# Patient Record
Sex: Female | Born: 1965
Health system: Southern US, Community
[De-identification: ages and names within clinical notes are randomized; demographics above are authoritative.]

## PROBLEM LIST (undated history)

## (undated) DIAGNOSIS — D509 Iron deficiency anemia, unspecified: Secondary | ICD-10-CM

## (undated) DIAGNOSIS — M109 Gout, unspecified: Secondary | ICD-10-CM

## (undated) DIAGNOSIS — E079 Disorder of thyroid, unspecified: Secondary | ICD-10-CM

## (undated) DIAGNOSIS — G629 Polyneuropathy, unspecified: Secondary | ICD-10-CM

## (undated) DIAGNOSIS — N183 Chronic kidney disease, stage 3 unspecified: Secondary | ICD-10-CM

## (undated) DIAGNOSIS — E669 Obesity, unspecified: Secondary | ICD-10-CM

## (undated) DIAGNOSIS — F419 Anxiety disorder, unspecified: Secondary | ICD-10-CM

## (undated) DIAGNOSIS — N950 Postmenopausal bleeding: Secondary | ICD-10-CM

## (undated) DIAGNOSIS — Z8669 Personal history of other diseases of the nervous system and sense organs: Secondary | ICD-10-CM

## (undated) DIAGNOSIS — R200 Anesthesia of skin: Secondary | ICD-10-CM

## (undated) DIAGNOSIS — K589 Irritable bowel syndrome without diarrhea: Secondary | ICD-10-CM

## (undated) DIAGNOSIS — E785 Hyperlipidemia, unspecified: Secondary | ICD-10-CM

## (undated) DIAGNOSIS — K219 Gastro-esophageal reflux disease without esophagitis: Secondary | ICD-10-CM

## (undated) DIAGNOSIS — N840 Polyp of corpus uteri: Secondary | ICD-10-CM

## (undated) DIAGNOSIS — G4730 Sleep apnea, unspecified: Secondary | ICD-10-CM

## (undated) DIAGNOSIS — M5126 Other intervertebral disc displacement, lumbar region: Secondary | ICD-10-CM

## (undated) DIAGNOSIS — M51369 Other intervertebral disc degeneration, lumbar region without mention of lumbar back pain or lower extremity pain: Secondary | ICD-10-CM

## (undated) DIAGNOSIS — I10 Essential (primary) hypertension: Secondary | ICD-10-CM

## (undated) DIAGNOSIS — C4490 Unspecified malignant neoplasm of skin, unspecified: Secondary | ICD-10-CM

## (undated) DIAGNOSIS — K760 Fatty (change of) liver, not elsewhere classified: Secondary | ICD-10-CM

## (undated) DIAGNOSIS — Z973 Presence of spectacles and contact lenses: Secondary | ICD-10-CM

## (undated) DIAGNOSIS — Z860100 Personal history of colon polyps, unspecified: Secondary | ICD-10-CM

## (undated) DIAGNOSIS — B009 Herpesviral infection, unspecified: Secondary | ICD-10-CM

## (undated) DIAGNOSIS — J302 Other seasonal allergic rhinitis: Secondary | ICD-10-CM

## (undated) DIAGNOSIS — E039 Hypothyroidism, unspecified: Secondary | ICD-10-CM

## (undated) DIAGNOSIS — M25551 Pain in right hip: Secondary | ICD-10-CM

## (undated) DIAGNOSIS — M5136 Other intervertebral disc degeneration, lumbar region: Secondary | ICD-10-CM

## (undated) DIAGNOSIS — Z8489 Family history of other specified conditions: Secondary | ICD-10-CM

## (undated) DIAGNOSIS — E119 Type 2 diabetes mellitus without complications: Secondary | ICD-10-CM

## (undated) DIAGNOSIS — M1990 Unspecified osteoarthritis, unspecified site: Secondary | ICD-10-CM

## (undated) HISTORY — DX: Type 2 diabetes mellitus without complications: E11.9

## (undated) HISTORY — PX: GASTRIC RESTRICTION SURGERY: SHX653

## (undated) HISTORY — DX: Gastro-esophageal reflux disease without esophagitis: K21.9

## (undated) HISTORY — PX: SKIN CANCER EXCISION: SHX779

## (undated) HISTORY — PX: TUBAL LIGATION: SHX77

## (undated) HISTORY — DX: Unspecified osteoarthritis, unspecified site: M19.90

## (undated) HISTORY — DX: Sleep apnea, unspecified: G47.30

## (undated) HISTORY — PX: ESOPHAGEAL DILATION: SHX303

## (undated) HISTORY — DX: Anxiety disorder, unspecified: F41.9

## (undated) HISTORY — DX: Disorder of thyroid, unspecified: E07.9

## (undated) HISTORY — PX: CHOLECYSTECTOMY: SHX55

## (undated) HISTORY — PX: UPPER GI ENDOSCOPY: SHX6162

## (undated) HISTORY — DX: Gout, unspecified: M10.9

## (undated) HISTORY — PX: TONSILLECTOMY: SUR1361

---

## 2002-06-17 ENCOUNTER — Encounter: Admission: RE | Admit: 2002-06-17 | Discharge: 2002-06-17 | Payer: Self-pay | Admitting: Unknown Physician Specialty

## 2002-06-17 ENCOUNTER — Encounter: Payer: Self-pay | Admitting: Unknown Physician Specialty

## 2003-07-01 ENCOUNTER — Ambulatory Visit (HOSPITAL_COMMUNITY): Admission: RE | Admit: 2003-07-01 | Discharge: 2003-07-01 | Payer: Self-pay | Admitting: Gastroenterology

## 2006-11-28 ENCOUNTER — Other Ambulatory Visit: Admission: RE | Admit: 2006-11-28 | Discharge: 2006-11-28 | Payer: Self-pay | Admitting: Family Medicine

## 2007-02-09 ENCOUNTER — Encounter: Admission: RE | Admit: 2007-02-09 | Discharge: 2007-02-09 | Payer: Self-pay | Admitting: Family Medicine

## 2007-03-17 ENCOUNTER — Encounter: Admission: RE | Admit: 2007-03-17 | Discharge: 2007-03-17 | Payer: Self-pay | Admitting: Family Medicine

## 2007-04-10 ENCOUNTER — Ambulatory Visit: Admission: RE | Admit: 2007-04-10 | Discharge: 2007-04-10 | Payer: Self-pay | Admitting: Gynecology

## 2007-08-12 ENCOUNTER — Encounter (INDEPENDENT_AMBULATORY_CARE_PROVIDER_SITE_OTHER): Payer: Self-pay | Admitting: Obstetrics and Gynecology

## 2007-08-12 ENCOUNTER — Ambulatory Visit (HOSPITAL_COMMUNITY): Admission: RE | Admit: 2007-08-12 | Discharge: 2007-08-12 | Payer: Self-pay | Admitting: Obstetrics and Gynecology

## 2008-04-18 ENCOUNTER — Encounter: Admission: RE | Admit: 2008-04-18 | Discharge: 2008-04-18 | Payer: Self-pay | Admitting: Family Medicine

## 2009-01-04 ENCOUNTER — Ambulatory Visit (HOSPITAL_BASED_OUTPATIENT_CLINIC_OR_DEPARTMENT_OTHER): Admission: RE | Admit: 2009-01-04 | Discharge: 2009-01-04 | Payer: Self-pay | Admitting: Family Medicine

## 2009-01-14 ENCOUNTER — Ambulatory Visit: Payer: Self-pay | Admitting: Internal Medicine

## 2009-04-11 ENCOUNTER — Encounter: Admission: RE | Admit: 2009-04-11 | Discharge: 2009-04-11 | Payer: Self-pay | Admitting: Gastroenterology

## 2010-03-30 ENCOUNTER — Encounter: Admission: RE | Admit: 2010-03-30 | Discharge: 2010-03-30 | Payer: Self-pay | Admitting: Obstetrics and Gynecology

## 2010-04-03 ENCOUNTER — Encounter: Admission: RE | Admit: 2010-04-03 | Discharge: 2010-04-03 | Payer: Self-pay | Admitting: Obstetrics and Gynecology

## 2010-12-09 ENCOUNTER — Encounter: Payer: Self-pay | Admitting: Obstetrics and Gynecology

## 2011-03-26 ENCOUNTER — Other Ambulatory Visit: Payer: Self-pay | Admitting: Obstetrics and Gynecology

## 2011-03-26 DIAGNOSIS — Z1231 Encounter for screening mammogram for malignant neoplasm of breast: Secondary | ICD-10-CM

## 2011-04-02 NOTE — Procedures (Signed)
NAME:  Holly Rodriguez, Holly Rodriguez                 ACCOUNT NO.:  [id number]   MEDICAL RECORD NO.:  [id number]          PATIENT TYPE:  OUT   LOCATION:  SLEEP CENTER                 FACILITY:  [facility]   PHYSICIAN:  Clinton D. Maple Hudson, MD, FCCP, FACPDATE OF BIRTH:  17-Jun-1966   DATE OF STUDY:  01/04/2009                            NOCTURNAL POLYSOMNOGRAM   REFERRING PHYSICIAN:   INDICATIONS FOR STUDY:  Hypersomnia with sleep apnea.   EPWORTH SLEEPINESS SCORE:  Epworth sleepiness score 9/24, BMI 48.7,  weight 275 pounds, height 63 inches, and neck 14 inches.   HOME MEDICATIONS:  Charted and reviewed.   SLEEP ARCHITECTURE:  Split study protocol.  During the diagnostic phase  total sleep time 188 minutes with sleep efficiency 76%.  Stage I was  9.5%, stage II 90.5%, stages III and REM were absent.  Sleep latency  24.5 minutes, awake after sleep onset 33 minutes, arousal index 71.9  indicating increased EEG arousal.  No bedtime medication was taken.   RESPIRATORY DATA:  Split study protocol.  Apnea/hypopnea index (AHI)  16.6 per hour.  A total of 52 events was scored, all as hypopneas.  Most  events were while none supine, but events were recorded in all sleep  positions.  CPAP was then titrated to 12 CWP, AHI 0 per hour.  She wore  a medium ResMed Mirage Quattro Full Face Mask with heated humidifier.   OXYGEN DATA:  Very loud snoring before CPAP with oxygen desaturation to  a nadir of 88%.  After CPAP control, Mean oxygen saturation held 95.2%  on room air.   CARDIAC DATA:  Normal sinus rhythm.   MOVEMENT/PARASOMNIA:  A few limb jerks with arousal, insignificant.  No  bathroom trips.   IMPRESSION/RECOMMENDATIONS:  1. Moderate obstructive sleep apnea/hypopnea syndrome, AHI 16.6 per      hour with events in all positions, most common while none supine,      very loud snoring with oxygen desaturation to a nadir of 88%.  2. Successful continuous positive airway pressure titration to 12 CWP,  apnea-hypopnea index 0 per hour.  She wore a medium ResMed Mirage      Quattro Full Face Mask with heated humidifier.      Clinton D. Maple Hudson, MD, FCCP, FACP  Diplomate, Biomedical engineer of Sleep Medicine  Electronically Signed     CDY/MEDQ  D:  01/07/2009 11:03:00  T:  01/08/2009 03:43:40  Job:  [id number]

## 2011-04-02 NOTE — Consult Note (Signed)
NAME:  Holly Rodriguez, Holly Rodriguez                 ACCOUNT NO.:  [id number]   MEDICAL RECORD NO.:  [id number]          PATIENT TYPE:  OUT   LOCATION:  GYN                          FACILITY:  [facility]   PHYSICIAN:  De Blanch, M.D.DATE OF BIRTH:  10/17/1966   DATE OF CONSULTATION:  04/10/2007  DATE OF DISCHARGE:                                 CONSULTATION   CHIEF COMPLAINT:  Cervical cyst.   HISTORY OF PRESENT ILLNESS:  A 45 year old white female seen in  consultation at the request of Dr. Olivia Mackie regarding a probable  large nabothian cyst.  The patient apparently underwent a CT scan of the  abdomen and pelvis to assess her intraabdominal pathology associated  with complications due to gastric bypass surgery.  In the course of that  examination, a cyst was identified in the cervix.  This has been  subsequently typified by transvaginal ultrasound which has identified a  4.5 x 3.6 x 3.5 cm cystic mass in the cervix and a second 1.8 x 1.3 x  1.4 cm mass.  The patient herself is entirely asymptomatic.   The patient has not had Pap smears for a number of years.  A recent Pap  smear showed low-grade SIL changes.   The patient has regular cyclic menstrual periods.  She has had a tubal  ligation.   PAST MEDICAL HISTORY:  Medical illnesses:  Hypertension,  gastroesophageal reflux disease, stress incontinence, and insomnia.   CURRENT MEDICATIONS:  Lasix, K-Dur.   FAMILY HISTORY:  Negative for gynecologic, breast or colon cancer.   OBSTETRICAL HISTORY:  Gravid 2.  Both pregnancies delivered by cesarean  section.   SOCIAL HISTORY:  The patient is single.  She does not smoke.   SURGICAL HISTORY:  Cesarean sections x2.  Two gastric bypass procedures.   REVIEW OF SYSTEMS:  Ten-point comprehensive review of systems negative  except as noted above.   DRUG ALLERGIES:  None.   PHYSICAL EXAMINATION:  VITAL SIGNS:  Weight 261 pounds, height 5 feet 3  inches, blood pressure 136/92, pulse  76.  GENERAL:  The patient is an obese, pleasant white female in no acute  distress.  HEENT:  Negative,  NECK:  Supple without thyromegaly.  ABDOMEN:  Obese, soft, nontender.  No mass, organomegaly, ascites, or  hernias noted  There is no supraclavicular or inguinal adenopathy.  PELVIC:  EG/BUS, vagina, bladder, urethra appear normal.  The cervix is  somewhat difficult to fully visualize as there seems to be a bit of a  constriction ring at the apex of the vagina.  Upon palpitation, the  cervix is enlarged to approximately 4-5 cm.  It is mobile and nontender.  Rectovaginal exam confirms.   IMPRESSION:  Large nabothian cyst which is entirely asymptomatic.  At  this juncture I would not recommend any further evaluation or treatment.   With regard to her low-grade SIL Pap smear, I would recommend she have a  repeat Pap smear in 6 months.  As long as her Pap smear remains low-  grade it would not suggest any further intervention.   She will return  to the care of Dr. Billy Coast for followup.      De Blanch, M.D.  Electronically Signed     DC/MEDQ  D:  04/10/2007  T:  04/10/2007  Job:  [id number]   cc:   Lenoard Aden, M.D.  Fax: [phone]   Telford Nab, R.N.  [address]  [place], [place] [address]

## 2011-04-02 NOTE — Op Note (Signed)
NAME:  Holly Rodriguez, Holly Rodriguez                 ACCOUNT NO.:  [id number]   MEDICAL RECORD NO.:  [id number]          PATIENT TYPE:  AMB   LOCATION:  SDC                           FACILITY:  [facility]   PHYSICIAN:  Lenoard Aden, M.D.DATE OF BIRTH:  04/11/1966   DATE OF PROCEDURE:  08/12/2007  DATE OF DISCHARGE:                               OPERATIVE REPORT   PREOPERATIVE DIAGNOSIS:  Enlarging cervical mass.   POSTOPERATIVE DIAGNOSIS:  Enlarging cervical mass.   PROCEDURE:  Excision and drainage of cervical mass.   SURGEON:  Lenoard Aden, M.D.   ANESTHESIA:  General.   BLOOD LOSS:  Less than 50 mL.   COMPLICATIONS:  None.   DRAINS:  None.   COUNTS:  Correct.   Patient to recovery in good condition.   SPECIMEN:  Cystic mass wall and constituents of the mass to include  thick, greenish paste-like fluid.   OPERATIVE PROCEDURE:  After being apprised of risks of anesthesia,  infection, bleeding, injury to abdominal organs, need for repair,  delayed versus immediate complications to include bowel and bladder  injury, inability to cure cancer, the patient brought to the operating  room where she was administered general anesthetic without  complications, prepped and draped in usual sterile fashion.  Catheterized until the bladder was empty.  Weighted speculum placed per  vagina, sidewall retractors placed.  Visualization reveals a bulbous 4  to 5 cm cervical mass which occupies the right anterior portion of the  cervix.  Bladder flap is identified.  External cervical os is identified  to the patient's the left lateral and posterior to this mass.  At this  point a midportion area of the mass was picked away from the  vasculature, bladder flap and external os and a small incision is made  using electrocautery.  Upon entering the mass it is noted that there is  a large amount of thick, green, paste that occupies the interior of this  mass which is all evacuated using suction and forceps to  evacuate this  otherwise thick paste.  The area is irrigated, side walls of the mass  appeared to be smooth-walled.  The tissue within this mass was sent to  pathology.  A piece of the side wall is then grasped and the side wall  is excised without difficulty.  Good hemostasis noted.  The defect  created by the previous contents of the cyst is then closed using a 2-0  Vicryl suture to close the dead space anteriorly and  then superficially closed using a 2-0 Vicryl interrupted suture.  Good  hemostasis is noted.  All instruments are removed, tissue sent to  pathology.  The patient tolerates procedure and well is transferred to  recovery in good condition.      Lenoard Aden, M.D.  Electronically Signed     RJT/MEDQ  D:  08/12/2007  T:  08/13/2007  Job:  [id number]

## 2011-04-05 NOTE — Op Note (Signed)
NAME:  Holly Rodriguez, Holly Rodriguez                           ACCOUNT NO.:  [id number]   MEDICAL RECORD NO.:  [id number]                   PATIENT TYPE:  AMB   LOCATION:  ENDO                                 FACILITY:  [facility]   PHYSICIAN:  Petra Kuba, M.D.                 DATE OF BIRTH:  02/18/1966   DATE OF PROCEDURE:  06/21/2003  DATE OF DISCHARGE:                                 OPERATIVE REPORT   PROCEDURE:  Esophagogastroduodenoscopy.   INDICATIONS FOR PROCEDURE:  The patient with longstanding upper tract  symptoms, status post gastric stapling procedures. Want to evaluate. Consent  was signed after the  risks, benefits, methods and options were thoroughly  discussed multiple  times in the past.   MEDICATIONS USED:  Demerol 100, Versed 10.   DESCRIPTION OF PROCEDURE:  The scope was inserted by direct vision. The  proximal  and mid esophagus  were normal. In the distal esophagus was a  small  hiatal hernia. She had interesting surgical changes of her stomach,  seeing that she had 2 discrete pouches, but both anastomoses and both  staplings were widely patent. There was a small amount of debris in the  first pouch.   The scope was inserted through both pouches through a normal antrum and  pylorus, duodenal bulb and around the 2nd portion of the duodenum. A slow  withdrawal back to the bulb confirmed its normal appearance. The scope was  then retroflexed which confirmed some of the surgical changes, but no  abnormality of the stomach  was seen on straight or retroflexed  visualization. We did wash and suction some of the debris out of the first  pouch.   The scope was then slowly withdrawn after airway suction. Again a good look  at the esophagus  was normal without signs of esophagitis or Barrett's  esophagus. The scope was removed. The patient tolerated the procedure well.  There were no obvious immediate complications.   ENDOSCOPIC DIAGNOSIS:  1. Small hiatal hernia without signs  of Barrett's esophagus or esophagitis.  2. Surgical changes noted. It almost looks like a double  pouch but both     with widely patent openings.  3. Otherwise  normal esophagogastroduodenoscopy.   PLAN:  1. Consider redo surgery.  2.     Consider checking her gallbladder.  My guess would probably be a surgeon     would want an upper GI to redefine the anatomy better  and follow up with     me in 2 months.  3. In the meantime b.i.d. proton pump inhibitors. Might even consider a     motility agent.  Petra Kuba, M.D.    MEM/MEDQ  D:  07/01/2003  T:  07/01/2003  Job:  [id number]   cc:   Ernestina Penna, M.D.  [address] [place]  [place] [address]  Fax: [phone]

## 2011-04-10 ENCOUNTER — Ambulatory Visit: Payer: Self-pay

## 2011-04-17 ENCOUNTER — Ambulatory Visit
Admission: RE | Admit: 2011-04-17 | Discharge: 2011-04-17 | Disposition: A | Discharge status: Home / Self Care | Payer: BC Managed Care – PPO | Source: Ambulatory Visit | Attending: Obstetrics and Gynecology | Admitting: Obstetrics and Gynecology

## 2011-04-17 DIAGNOSIS — Z1231 Encounter for screening mammogram for malignant neoplasm of breast: Secondary | ICD-10-CM

## 2011-05-29 ENCOUNTER — Other Ambulatory Visit: Payer: Self-pay | Admitting: Family Medicine

## 2011-05-29 DIAGNOSIS — E041 Nontoxic single thyroid nodule: Secondary | ICD-10-CM

## 2011-06-04 ENCOUNTER — Ambulatory Visit
Admission: RE | Admit: 2011-06-04 | Discharge: 2011-06-04 | Disposition: A | Discharge status: Home / Self Care | Payer: BC Managed Care – PPO | Source: Ambulatory Visit | Attending: Family Medicine | Admitting: Family Medicine

## 2011-06-04 DIAGNOSIS — E041 Nontoxic single thyroid nodule: Secondary | ICD-10-CM

## 2011-08-29 LAB — CBC
HCT: 40
MCHC: 34.3
Platelets: 348
RDW: 14.9 — ABNORMAL HIGH

## 2011-08-29 LAB — SAMPLE TO BLOOD BANK

## 2011-08-29 LAB — BASIC METABOLIC PANEL
BUN: 9
CO2: 24
Calcium: 8.9
GFR calc non Af Amer: >60
Glucose, Bld: 98

## 2012-06-16 ENCOUNTER — Other Ambulatory Visit: Payer: Self-pay | Admitting: Obstetrics and Gynecology

## 2012-06-16 DIAGNOSIS — Z1231 Encounter for screening mammogram for malignant neoplasm of breast: Secondary | ICD-10-CM

## 2012-06-29 ENCOUNTER — Ambulatory Visit: Payer: BC Managed Care – PPO

## 2012-07-01 ENCOUNTER — Ambulatory Visit: Payer: BC Managed Care – PPO

## 2012-07-10 ENCOUNTER — Ambulatory Visit
Admission: RE | Admit: 2012-07-10 | Discharge: 2012-07-10 | Disposition: A | Discharge status: Home / Self Care | Payer: BC Managed Care – PPO | Source: Ambulatory Visit | Attending: Obstetrics and Gynecology | Admitting: Obstetrics and Gynecology

## 2012-07-10 DIAGNOSIS — Z1231 Encounter for screening mammogram for malignant neoplasm of breast: Secondary | ICD-10-CM

## 2012-11-18 DIAGNOSIS — Z8719 Personal history of other diseases of the digestive system: Secondary | ICD-10-CM

## 2012-11-18 HISTORY — DX: Personal history of other diseases of the digestive system: Z87.19

## 2013-02-01 ENCOUNTER — Other Ambulatory Visit: Payer: Self-pay | Admitting: Nurse Practitioner

## 2013-02-01 LAB — COMPLETE METABOLIC PANEL WITH GFR
ALT: 28 U/L (ref 0–35)
BUN: 14 mg/dL (ref 6–23)
CO2: 31 mEq/L (ref 19–32)
Creat: 0.67 mg/dL (ref 0.50–1.10)
GFR, Est African American: >89 mL/min
GFR, Est Non African American: >89 mL/min
Total Bilirubin: 0.3 mg/dL (ref 0.3–1.2)

## 2013-02-01 LAB — THYROID PANEL WITH TSH
Free Thyroxine Index: 2.7 (ref 1.0–3.9)
T3 Uptake: 32.4 % (ref 22.5–37.0)
T4, Total: 8.2 ug/dL (ref 5.0–12.5)

## 2013-02-02 LAB — ANEMIA PANEL 7
%SAT: 10 % — ABNORMAL LOW (ref 20–55)
Ferritin: 8 ng/mL — ABNORMAL LOW (ref 10–291)
Folate: 4.8 ng/mL
MCH: 26.6 pg (ref 26.0–34.0)
MCHC: 32.6 g/dL (ref 30.0–36.0)
MCV: 81.5 fL (ref 78.0–100.0)
Platelets: 319 10*3/uL (ref 150–400)
RBC: 4.59 MIL/uL (ref 3.87–5.11)
RDW: 15.5 % (ref 11.5–15.5)
Retic Ct Pct: 1.7 % (ref 0.4–2.3)
Vitamin B-12: 441 pg/mL (ref 211–911)

## 2013-02-04 ENCOUNTER — Telehealth: Payer: Self-pay | Admitting: Nurse Practitioner

## 2013-02-04 LAB — NMR LIPOPROFILE WITH LIPIDS
LDL (calc): 97 mg/dL (ref <100)
LDL Particle Number: 1581 nmol/L — ABNORMAL HIGH (ref <1000)
LDL Size: 20.8 nm (ref >20.5)
LP-IR Score: 89 — ABNORMAL HIGH (ref <=45)
Large VLDL-P: 13.7 nmol/L — ABNORMAL HIGH (ref <=2.7)
Small LDL Particle Number: 692 nmol/L — ABNORMAL HIGH (ref <=527)
VLDL Size: 56.2 nm — ABNORMAL HIGH (ref >=46.6)

## 2013-02-04 NOTE — Telephone Encounter (Signed)
Pt aware labs not in yet and will call as soon as we get them

## 2013-02-04 NOTE — Telephone Encounter (Signed)
Wants results from labs done on Monday.

## 2013-02-08 ENCOUNTER — Telehealth: Payer: Self-pay | Admitting: Family Medicine

## 2013-02-08 NOTE — Telephone Encounter (Signed)
Patient wants lab results she had done last week labs are in the system

## 2013-02-08 NOTE — Telephone Encounter (Signed)
Patient aware of labs.  

## 2013-02-16 ENCOUNTER — Other Ambulatory Visit: Payer: Self-pay | Admitting: Nurse Practitioner

## 2013-05-03 ENCOUNTER — Other Ambulatory Visit: Payer: Self-pay

## 2013-08-06 ENCOUNTER — Ambulatory Visit: Payer: Self-pay | Admitting: Family Medicine

## 2013-08-09 ENCOUNTER — Ambulatory Visit (INDEPENDENT_AMBULATORY_CARE_PROVIDER_SITE_OTHER): Payer: BC Managed Care – PPO | Admitting: Family Medicine

## 2013-08-09 ENCOUNTER — Encounter: Payer: Self-pay | Admitting: Family Medicine

## 2013-08-09 VITALS — BP 130/83 | HR 89 | Temp 97.1°F | Ht 64.0 in | Wt 282.6 lb

## 2013-08-09 DIAGNOSIS — F411 Generalized anxiety disorder: Secondary | ICD-10-CM

## 2013-08-09 DIAGNOSIS — K589 Irritable bowel syndrome without diarrhea: Secondary | ICD-10-CM

## 2013-08-09 DIAGNOSIS — D649 Anemia, unspecified: Secondary | ICD-10-CM

## 2013-08-09 DIAGNOSIS — E039 Hypothyroidism, unspecified: Secondary | ICD-10-CM

## 2013-08-09 DIAGNOSIS — R609 Edema, unspecified: Secondary | ICD-10-CM

## 2013-08-09 DIAGNOSIS — K219 Gastro-esophageal reflux disease without esophagitis: Secondary | ICD-10-CM

## 2013-08-09 LAB — POCT CBC
Granulocyte percent: 62.2 %G (ref 37–80)
HCT, POC: 38.7 % (ref 37.7–47.9)
Hemoglobin: 12.9 g/dL (ref 12.2–16.2)
Lymph, poc: 4.4 — AB (ref 0.6–3.4)
MCH, POC: 27.1 pg (ref 27–31.2)
MCHC: 33.3 g/dL (ref 31.8–35.4)
MCV: 81.4 fL (ref 80–97)
MPV: 7.9 fL (ref 0–99.8)
POC Granulocyte: 7.9 — AB (ref 2–6.9)
POC LYMPH PERCENT: 34.6 %L (ref 10–50)
Platelet Count, POC: 281 10*3/uL (ref 142–424)
RBC: 4.8 M/uL (ref 4.04–5.48)
RDW, POC: 17.1 %
WBC: 12.7 10*3/uL — AB (ref 4.6–10.2)

## 2013-08-09 MED ORDER — FUROSEMIDE 40 MG PO TABS: 40.0000 mg | ORAL_TABLET | Freq: Every day | ORAL | Status: DC

## 2013-08-09 MED ORDER — LEVOTHYROXINE SODIUM 75 MCG PO TABS: 75.0000 ug | ORAL_TABLET | Freq: Every day | ORAL | Status: DC

## 2013-08-09 MED ORDER — OMEPRAZOLE 40 MG PO CPDR: 40.0000 mg | DELAYED_RELEASE_CAPSULE | Freq: Every day | ORAL | Status: DC

## 2013-08-09 MED ORDER — HYOSCYAMINE SULFATE ER 0.375 MG PO TB12: 0.3750 mg | ORAL_TABLET | Freq: Two times a day (BID) | ORAL | Status: DC | PRN

## 2013-08-09 MED ORDER — POTASSIUM CHLORIDE ER 10 MEQ PO TBCR: 10.0000 meq | EXTENDED_RELEASE_TABLET | Freq: Every day | ORAL | Status: DC

## 2013-08-09 MED ORDER — ALPRAZOLAM 0.25 MG PO TABS: 0.2500 mg | ORAL_TABLET | Freq: Two times a day (BID) | ORAL | Status: DC | PRN

## 2013-08-09 NOTE — Progress Notes (Signed)
  Subjective:    Patient ID: Holly Rodriguez, female    DOB: 1966-03-04, 47 y.o.   MRN: [id number]  HPI  This 47 y.o. female presents for evaluation of needing refills.  She has hx of  GERD, and hypothyroidism.  She has hx of IBS and takes levbid.  She is due for labs.  Review of Systems    No chest pain, SOB, HA, dizziness, vision change, N/V, diarrhea, constipation, dysuria, urinary urgency or frequency, myalgias, arthralgias or rash.  Objective:   Physical Exam   Vital signs noted  Well developed well nourished female.  HEENT - Head atraumatic Normocephalic                Eyes - PERRLA, Conjuctiva - clear Sclera- Clear EOMI                Ears - EAC's Wnl TM's Wnl Gross Hearing WNL                Nose - Nares patent                 Throat - oropharanx wnl Respiratory - Lungs CTA bilateral Cardiac - RRR S1 and S2 without murmur GI - Abdomen soft Nontender and bowel sounds active x 4 Extremities - No edema. Neuro - Grossly intact.     Assessment & Plan:  Unspecified hypothyroidism - Plan: levothyroxine (SYNTHROID) 75 MCG tablet, CMP14+EGFR, Thyroid Panel With TSH  GERD (gastroesophageal reflux disease) - Plan: omeprazole (PRILOSEC) 40 MG capsule  Anemia - Plan: POCT CBC  Edema - Plan: potassium chloride (KLOR-CON 10) 10 MEQ tablet, furosemide (LASIX) 40 MG tablet  Anxiety state, unspecified - Plan: ALPRAZolam (XANAX) 0.25 MG tablet  IBS (irritable bowel syndrome) - Plan: hyoscyamine (LEVBID) 0.375 MG 12 hr tablet  Follow up in 6 months  Deatra Canter FNP

## 2013-08-10 LAB — CMP14+EGFR
ALT: 38 IU/L — ABNORMAL HIGH (ref 0–32)
AST: 32 IU/L (ref 0–40)
Albumin/Globulin Ratio: 1.4 (ref 1.1–2.5)
Albumin: 4.2 g/dL (ref 3.5–5.5)
Alkaline Phosphatase: 107 IU/L (ref 39–117)
BUN/Creatinine Ratio: 17 (ref 9–23)
BUN: 10 mg/dL (ref 6–24)
CO2: 24 mmol/L (ref 18–29)
Calcium: 9.3 mg/dL (ref 8.7–10.2)
Chloride: 94 mmol/L — ABNORMAL LOW (ref 97–108)
Creatinine, Ser: 0.59 mg/dL (ref 0.57–1.00)
GFR calc Af Amer: 126 mL/min/{1.73_m2} (ref >59)
GFR calc non Af Amer: 109 mL/min/{1.73_m2} (ref >59)
Globulin, Total: 2.9 g/dL (ref 1.5–4.5)
Glucose: 134 mg/dL — ABNORMAL HIGH (ref 65–99)
Potassium: 3.6 mmol/L (ref 3.5–5.2)
Sodium: 140 mmol/L (ref 134–144)
Total Bilirubin: 0.2 mg/dL (ref 0.0–1.2)
Total Protein: 7.1 g/dL (ref 6.0–8.5)

## 2013-08-10 LAB — THYROID PANEL WITH TSH
Free Thyroxine Index: 2.6 (ref 1.2–4.9)
T3 Uptake Ratio: 29 % (ref 24–39)
T4, Total: 8.9 ug/dL (ref 4.5–12.0)
TSH: 4.11 u[IU]/mL (ref 0.450–4.500)

## 2013-08-10 NOTE — Patient Instructions (Signed)

## 2013-09-10 ENCOUNTER — Telehealth: Payer: Self-pay | Admitting: Family Medicine

## 2013-09-15 NOTE — Telephone Encounter (Signed)
Husband will relay results. 

## 2013-09-20 NOTE — Telephone Encounter (Signed)
LABS GIVEN TO PT. PT SAID SHE GOT RX FOR XANAX AND WAS .25  FOR BID AND SHOULD BE .5 BID #30. PT DIDN'T FILL RX AND CAN BRING RX BACK TO [place] IF YOU WILL CHANGE RX. ALSO LEVBID SENT IN TO MAIL ORDER FOR NAME BRAND, NEEDS THAT CHANGED TO GENERIC.

## 2013-09-20 NOTE — Telephone Encounter (Signed)
Yes tell her to bring rx back so it can be destroyed in front of me and then I will rx her xanax 0.5, I did look this up On the CSR and she has been rx'd 0.5mg  in the past.

## 2013-09-22 NOTE — Telephone Encounter (Signed)
PT NOTIFIED AND VERBALIZED UNDERSTANDING. 

## 2013-10-20 ENCOUNTER — Other Ambulatory Visit: Payer: Self-pay | Admitting: Family Medicine

## 2013-10-20 ENCOUNTER — Other Ambulatory Visit (INDEPENDENT_AMBULATORY_CARE_PROVIDER_SITE_OTHER): Payer: BC Managed Care – PPO

## 2013-10-20 DIAGNOSIS — R739 Hyperglycemia, unspecified: Secondary | ICD-10-CM

## 2013-10-20 DIAGNOSIS — R7309 Other abnormal glucose: Secondary | ICD-10-CM

## 2013-10-20 DIAGNOSIS — D72829 Elevated white blood cell count, unspecified: Secondary | ICD-10-CM

## 2013-10-20 DIAGNOSIS — F411 Generalized anxiety disorder: Secondary | ICD-10-CM

## 2013-10-20 LAB — POCT CBC
Granulocyte percent: 57.8 %G (ref 37–80)
HCT, POC: 42.3 % (ref 37.7–47.9)
Hemoglobin: 13 g/dL (ref 12.2–16.2)
Lymph, poc: 4.7 — AB (ref 0.6–3.4)
MCH, POC: 26.3 pg — AB (ref 27–31.2)
MCHC: 30.7 g/dL — AB (ref 31.8–35.4)
MCV: 85.6 fL (ref 80–97)
MPV: 7.3 fL (ref 0–99.8)
POC Granulocyte: 7.5 — AB (ref 2–6.9)
POC LYMPH PERCENT: 36.7 %L (ref 10–50)
Platelet Count, POC: 306 10*3/uL (ref 142–424)
RBC: 4.9 M/uL (ref 4.04–5.48)
RDW, POC: 16.5 %
WBC: 12.9 10*3/uL — AB (ref 4.6–10.2)

## 2013-10-20 LAB — POCT GLYCOSYLATED HEMOGLOBIN (HGB A1C): Hemoglobin A1C: 7.5

## 2013-10-20 MED ORDER — ALPRAZOLAM 0.5 MG PO TABS: 0.5000 mg | ORAL_TABLET | Freq: Every evening | ORAL | Status: DC | PRN

## 2013-10-20 MED ORDER — SERTRALINE HCL 50 MG PO TABS: 50.0000 mg | ORAL_TABLET | Freq: Every day | ORAL | Status: DC

## 2013-10-20 MED ORDER — HYOSCYAMINE SULFATE ER 0.375 MG PO TB12: 0.3750 mg | ORAL_TABLET | Freq: Two times a day (BID) | ORAL | Status: DC

## 2013-10-20 NOTE — Progress Notes (Signed)
Pt came in for labs only 

## 2013-10-21 ENCOUNTER — Other Ambulatory Visit: Payer: Self-pay | Admitting: Family Medicine

## 2013-10-21 DIAGNOSIS — E119 Type 2 diabetes mellitus without complications: Secondary | ICD-10-CM

## 2013-10-21 MED ORDER — METFORMIN HCL 500 MG PO TABS: 500.0000 mg | ORAL_TABLET | Freq: Two times a day (BID) | ORAL | Status: DC

## 2013-10-25 ENCOUNTER — Encounter: Payer: Self-pay | Admitting: Family Medicine

## 2013-10-25 ENCOUNTER — Encounter (HOSPITAL_COMMUNITY): Payer: Self-pay | Admitting: Emergency Medicine

## 2013-10-25 ENCOUNTER — Ambulatory Visit (HOSPITAL_COMMUNITY)
Admission: RE | Admit: 2013-10-25 | Discharge: 2013-10-25 | Disposition: A | Discharge status: Home / Self Care | Payer: BC Managed Care – PPO | Source: Ambulatory Visit | Attending: Family Medicine | Admitting: Family Medicine

## 2013-10-25 ENCOUNTER — Encounter (HOSPITAL_COMMUNITY): Payer: BC Managed Care – PPO | Admitting: Anesthesiology

## 2013-10-25 ENCOUNTER — Encounter (HOSPITAL_COMMUNITY)
Admission: EM | Disposition: A | Discharge status: Home / Self Care | Payer: Self-pay | Source: Home / Self Care | Attending: Emergency Medicine

## 2013-10-25 ENCOUNTER — Observation Stay (HOSPITAL_COMMUNITY)
Admission: EM | Admit: 2013-10-25 | Discharge: 2013-10-26 | Disposition: A | Discharge status: Home / Self Care | Payer: BC Managed Care – PPO | Attending: General Surgery | Admitting: General Surgery

## 2013-10-25 ENCOUNTER — Observation Stay (HOSPITAL_COMMUNITY): Payer: BC Managed Care – PPO | Admitting: Anesthesiology

## 2013-10-25 ENCOUNTER — Ambulatory Visit (INDEPENDENT_AMBULATORY_CARE_PROVIDER_SITE_OTHER): Payer: BC Managed Care – PPO | Admitting: Family Medicine

## 2013-10-25 VITALS — BP 143/96 | HR 89 | Temp 98.7°F | Ht 64.0 in | Wt 275.0 lb

## 2013-10-25 DIAGNOSIS — K3580 Unspecified acute appendicitis: Principal | ICD-10-CM | POA: Diagnosis present

## 2013-10-25 DIAGNOSIS — R109 Unspecified abdominal pain: Secondary | ICD-10-CM

## 2013-10-25 DIAGNOSIS — Z01812 Encounter for preprocedural laboratory examination: Secondary | ICD-10-CM | POA: Insufficient documentation

## 2013-10-25 HISTORY — PX: LAPAROSCOPIC APPENDECTOMY: SHX408

## 2013-10-25 HISTORY — DX: Irritable bowel syndrome, unspecified: K58.9

## 2013-10-25 LAB — CBC WITH DIFFERENTIAL/PLATELET
Basophils Absolute: 0.1 10*3/uL (ref 0.0–0.1)
Basophils Relative: 1 % (ref 0–1)
Eosinophils Absolute: 0.2 10*3/uL (ref 0.0–0.7)
Eosinophils Relative: 2 % (ref 0–5)
HCT: 38.9 % (ref 36.0–46.0)
Hemoglobin: 12.9 g/dL (ref 12.0–15.0)
Lymphocytes Relative: 31 % (ref 12–46)
Lymphs Abs: 4.1 10*3/uL — ABNORMAL HIGH (ref 0.7–4.0)
MCH: 28.7 pg (ref 26.0–34.0)
MCHC: 33.2 g/dL (ref 30.0–36.0)
MCV: 86.4 fL (ref 78.0–100.0)
Monocytes Absolute: 0.8 10*3/uL (ref 0.1–1.0)
Monocytes Relative: 6 % (ref 3–12)
Neutro Abs: 8.2 10*3/uL — ABNORMAL HIGH (ref 1.7–7.7)
Neutrophils Relative %: 60 % (ref 43–77)
Platelets: 315 10*3/uL (ref 150–400)
RBC: 4.5 MIL/uL (ref 3.87–5.11)
RDW: 15.4 % (ref 11.5–15.5)
WBC: 13.3 10*3/uL — ABNORMAL HIGH (ref 4.0–10.5)

## 2013-10-25 LAB — POCT CBC
Granulocyte percent: 70.4 %G (ref 37–80)
HCT, POC: 43.3 % (ref 37.7–47.9)
POC Granulocyte: 11.1 — AB (ref 2–6.9)
POC LYMPH PERCENT: 28.4 %L (ref 10–50)
RDW, POC: 16 %
WBC: 15.8 10*3/uL — AB (ref 4.6–10.2)

## 2013-10-25 LAB — BASIC METABOLIC PANEL WITH GFR
BUN: 8 mg/dL (ref 6–23)
CO2: 26 meq/L (ref 19–32)
Calcium: 8.9 mg/dL (ref 8.4–10.5)
Chloride: 94 meq/L — ABNORMAL LOW (ref 96–112)
Creatinine, Ser: 0.59 mg/dL (ref 0.50–1.10)
GFR calc Af Amer: >90 mL/min
GFR calc non Af Amer: >90 mL/min
Glucose, Bld: 155 mg/dL — ABNORMAL HIGH (ref 70–99)
Potassium: 3.2 meq/L — ABNORMAL LOW (ref 3.5–5.1)
Sodium: 136 meq/L (ref 135–145)

## 2013-10-25 LAB — POCT URINALYSIS DIPSTICK
Bilirubin, UA: NEGATIVE
Ketones, UA: NEGATIVE
Leukocytes, UA: NEGATIVE
Spec Grav, UA: 1.01
pH, UA: 7

## 2013-10-25 LAB — POCT UA - MICROSCOPIC ONLY
Bacteria, U Microscopic: NEGATIVE
Mucus, UA: NEGATIVE
WBC, Ur, HPF, POC: NEGATIVE
Yeast, UA: NEGATIVE

## 2013-10-25 LAB — POCT URINE PREGNANCY: Preg Test, Ur: NEGATIVE

## 2013-10-25 SURGERY — APPENDECTOMY, LAPAROSCOPIC
Anesthesia: General | Site: Abdomen

## 2013-10-25 MED ORDER — OXYCODONE-ACETAMINOPHEN 5-325 MG PO TABS
1.0000 | ORAL_TABLET | ORAL | Status: DC | PRN
  Administered 2013-10-26: 2 via ORAL
  Filled 2013-10-25: qty 2

## 2013-10-25 MED ORDER — BUPIVACAINE HCL (PF) 0.5 % IJ SOLN
INTRAMUSCULAR | Status: AC
  Filled 2013-10-25: qty 30

## 2013-10-25 MED ORDER — NEOSTIGMINE METHYLSULFATE 1 MG/ML IJ SOLN
INTRAMUSCULAR | Status: DC | PRN
  Administered 2013-10-25: 4 mg via INTRAVENOUS

## 2013-10-25 MED ORDER — ROCURONIUM BROMIDE 100 MG/10ML IV SOLN
INTRAVENOUS | Status: DC | PRN
  Administered 2013-10-25: 20 mg via INTRAVENOUS
  Administered 2013-10-25: 10 mg via INTRAVENOUS

## 2013-10-25 MED ORDER — ARTIFICIAL TEARS OP OINT
TOPICAL_OINTMENT | OPHTHALMIC | Status: AC
  Filled 2013-10-25: qty 3.5

## 2013-10-25 MED ORDER — LABETALOL HCL 5 MG/ML IV SOLN
INTRAVENOUS | Status: DC | PRN
  Administered 2013-10-25: 5 mg via INTRAVENOUS

## 2013-10-25 MED ORDER — KETOROLAC TROMETHAMINE 30 MG/ML IJ SOLN
30.0000 mg | Freq: Once | INTRAMUSCULAR | Status: AC
  Administered 2013-10-25: 30 mg via INTRAVENOUS

## 2013-10-25 MED ORDER — FENTANYL CITRATE 0.05 MG/ML IJ SOLN
INTRAMUSCULAR | Status: DC | PRN
  Administered 2013-10-25: 100 ug via INTRAVENOUS
  Administered 2013-10-25 (×2): 50 ug via INTRAVENOUS
  Administered 2013-10-25: 100 ug via INTRAVENOUS
  Administered 2013-10-25: 50 ug via INTRAVENOUS

## 2013-10-25 MED ORDER — HYDROMORPHONE HCL PF 1 MG/ML IJ SOLN: 0.5000 mg | INTRAMUSCULAR | Status: AC | PRN

## 2013-10-25 MED ORDER — HYDROMORPHONE HCL PF 1 MG/ML IJ SOLN
1.0000 mg | INTRAMUSCULAR | Status: DC | PRN
  Administered 2013-10-26 (×4): 1 mg via INTRAVENOUS
  Filled 2013-10-25 (×4): qty 1

## 2013-10-25 MED ORDER — ONDANSETRON HCL 4 MG/2ML IJ SOLN
INTRAMUSCULAR | Status: AC
  Filled 2013-10-25: qty 2

## 2013-10-25 MED ORDER — SODIUM CHLORIDE 0.9 % IV SOLN
INTRAVENOUS | Status: AC
  Filled 2013-10-25: qty 3

## 2013-10-25 MED ORDER — LIDOCAINE HCL (CARDIAC) 20 MG/ML IV SOLN
INTRAVENOUS | Status: DC | PRN
  Administered 2013-10-25: 50 mg via INTRAVENOUS

## 2013-10-25 MED ORDER — LACTATED RINGERS IV SOLN
INTRAVENOUS | Status: DC
  Administered 2013-10-25: via INTRAVENOUS

## 2013-10-25 MED ORDER — ONDANSETRON HCL 4 MG PO TABS: 4.0000 mg | ORAL_TABLET | Freq: Four times a day (QID) | ORAL | Status: DC | PRN

## 2013-10-25 MED ORDER — PROPOFOL 10 MG/ML IV EMUL
INTRAVENOUS | Status: AC
  Filled 2013-10-25: qty 20

## 2013-10-25 MED ORDER — MIDAZOLAM HCL 5 MG/5ML IJ SOLN
INTRAMUSCULAR | Status: DC | PRN
  Administered 2013-10-25: 2 mg via INTRAVENOUS

## 2013-10-25 MED ORDER — SUCCINYLCHOLINE CHLORIDE 20 MG/ML IJ SOLN
INTRAMUSCULAR | Status: DC | PRN
  Administered 2013-10-25: 120 mg via INTRAVENOUS

## 2013-10-25 MED ORDER — HYDROMORPHONE HCL PF 1 MG/ML IJ SOLN
1.0000 mg | Freq: Once | INTRAMUSCULAR | Status: AC
  Administered 2013-10-25: 1 mg via INTRAVENOUS
  Filled 2013-10-25: qty 1

## 2013-10-25 MED ORDER — KETOROLAC TROMETHAMINE 30 MG/ML IJ SOLN
INTRAMUSCULAR | Status: AC
  Filled 2013-10-25: qty 1

## 2013-10-25 MED ORDER — ENOXAPARIN SODIUM 40 MG/0.4ML ~~LOC~~ SOLN
40.0000 mg | Freq: Once | SUBCUTANEOUS | Status: AC
  Administered 2013-10-25: 40 mg via SUBCUTANEOUS
  Filled 2013-10-25: qty 0.4

## 2013-10-25 MED ORDER — LEVOTHYROXINE SODIUM 75 MCG PO TABS
75.0000 ug | ORAL_TABLET | Freq: Every day | ORAL | Status: DC
  Administered 2013-10-26: 75 ug via ORAL
  Filled 2013-10-25: qty 1

## 2013-10-25 MED ORDER — SODIUM CHLORIDE 0.9 % IV SOLN
3.0000 g | Freq: Four times a day (QID) | INTRAVENOUS | Status: DC
  Administered 2013-10-26 (×3): 3 g via INTRAVENOUS
  Filled 2013-10-25 (×6): qty 3

## 2013-10-25 MED ORDER — ENOXAPARIN SODIUM 40 MG/0.4ML ~~LOC~~ SOLN
40.0000 mg | SUBCUTANEOUS | Status: DC
  Administered 2013-10-26: 40 mg via SUBCUTANEOUS
  Filled 2013-10-25: qty 0.4

## 2013-10-25 MED ORDER — ONDANSETRON HCL 4 MG/2ML IJ SOLN
4.0000 mg | Freq: Four times a day (QID) | INTRAMUSCULAR | Status: DC | PRN
  Administered 2013-10-26: 4 mg via INTRAVENOUS

## 2013-10-25 MED ORDER — LABETALOL HCL 5 MG/ML IV SOLN
INTRAVENOUS | Status: AC
  Filled 2013-10-25: qty 4

## 2013-10-25 MED ORDER — FENTANYL CITRATE 0.05 MG/ML IJ SOLN
INTRAMUSCULAR | Status: AC
  Filled 2013-10-25: qty 5

## 2013-10-25 MED ORDER — ONDANSETRON HCL 4 MG/2ML IJ SOLN
4.0000 mg | Freq: Three times a day (TID) | INTRAMUSCULAR | Status: AC | PRN
  Filled 2013-10-25: qty 2

## 2013-10-25 MED ORDER — ONDANSETRON HCL 4 MG/2ML IJ SOLN
INTRAMUSCULAR | Status: DC | PRN
  Administered 2013-10-25: 4 mg via INTRAVENOUS

## 2013-10-25 MED ORDER — SODIUM CHLORIDE 0.9 % IV SOLN
INTRAVENOUS | Status: DC
  Administered 2013-10-25 (×3): via INTRAVENOUS

## 2013-10-25 MED ORDER — FUROSEMIDE 40 MG PO TABS
40.0000 mg | ORAL_TABLET | Freq: Every day | ORAL | Status: DC
  Administered 2013-10-26: 40 mg via ORAL
  Filled 2013-10-25: qty 1

## 2013-10-25 MED ORDER — MIDAZOLAM HCL 2 MG/2ML IJ SOLN
INTRAMUSCULAR | Status: AC
  Filled 2013-10-25: qty 2

## 2013-10-25 MED ORDER — LACTATED RINGERS IV SOLN
INTRAVENOUS | Status: DC | PRN
  Administered 2013-10-25: 22:00:00 via INTRAVENOUS

## 2013-10-25 MED ORDER — INSULIN ASPART 100 UNIT/ML ~~LOC~~ SOLN
0.0000 [IU] | Freq: Three times a day (TID) | SUBCUTANEOUS | Status: DC
  Administered 2013-10-26: 3 [IU] via SUBCUTANEOUS
  Administered 2013-10-26 (×2): 4 [IU] via SUBCUTANEOUS

## 2013-10-25 MED ORDER — LIDOCAINE HCL (PF) 1 % IJ SOLN
INTRAMUSCULAR | Status: AC
  Filled 2013-10-25: qty 5

## 2013-10-25 MED ORDER — SODIUM CHLORIDE 0.9 % IR SOLN
Status: DC | PRN
  Administered 2013-10-25: 500 mL

## 2013-10-25 MED ORDER — IOHEXOL 300 MG/ML  SOLN
100.0000 mL | Freq: Once | INTRAMUSCULAR | Status: AC | PRN
  Administered 2013-10-25: 100 mL via INTRAVENOUS

## 2013-10-25 MED ORDER — GLYCOPYRROLATE 0.2 MG/ML IJ SOLN
INTRAMUSCULAR | Status: DC | PRN
  Administered 2013-10-25: 0.2 mg via INTRAVENOUS
  Administered 2013-10-25: 0.6 mg via INTRAVENOUS

## 2013-10-25 MED ORDER — ACETAMINOPHEN 325 MG PO TABS: 650.0000 mg | ORAL_TABLET | Freq: Four times a day (QID) | ORAL | Status: DC | PRN

## 2013-10-25 MED ORDER — BUPIVACAINE HCL (PF) 0.5 % IJ SOLN
INTRAMUSCULAR | Status: DC | PRN
  Administered 2013-10-25: 10 mL

## 2013-10-25 MED ORDER — ROCURONIUM BROMIDE 50 MG/5ML IV SOLN
INTRAVENOUS | Status: AC
  Filled 2013-10-25: qty 1

## 2013-10-25 MED ORDER — PROPOFOL 10 MG/ML IV BOLUS
INTRAVENOUS | Status: DC | PRN
  Administered 2013-10-25: 150 mg via INTRAVENOUS

## 2013-10-25 MED ORDER — POVIDONE-IODINE 10 % EX OINT
TOPICAL_OINTMENT | CUTANEOUS | Status: AC
  Filled 2013-10-25: qty 1

## 2013-10-25 MED ORDER — GLYCOPYRROLATE 0.2 MG/ML IJ SOLN
INTRAMUSCULAR | Status: AC
  Filled 2013-10-25: qty 4

## 2013-10-25 MED ORDER — SODIUM CHLORIDE 0.9 % IV SOLN
INTRAVENOUS | Status: AC
  Administered 2013-10-26: 09:00:00 via INTRAVENOUS

## 2013-10-25 MED ORDER — POTASSIUM CHLORIDE CRYS ER 10 MEQ PO TBCR
10.0000 meq | EXTENDED_RELEASE_TABLET | Freq: Every day | ORAL | Status: DC
  Administered 2013-10-26: 10 meq via ORAL
  Filled 2013-10-25 (×3): qty 1

## 2013-10-25 MED ORDER — SODIUM CHLORIDE 0.9 % IV SOLN
3.0000 g | Freq: Once | INTRAVENOUS | Status: AC
  Administered 2013-10-25: 3 g via INTRAVENOUS
  Filled 2013-10-25: qty 3

## 2013-10-25 SURGICAL SUPPLY — 49 items
BAG HAMPER (MISCELLANEOUS) ×2 IMPLANT
BAG SPEC RTRVL LRG 6X4 10 (ENDOMECHANICALS) ×1
CLOTH BEACON ORANGE TIMEOUT ST (SAFETY) ×2 IMPLANT
COVER LIGHT HANDLE STERIS (MISCELLANEOUS) ×4 IMPLANT
CUTTER FLEX LINEAR 45M (STAPLE) ×1 IMPLANT
CUTTER LINEAR ENDO 35 ETS (STAPLE) IMPLANT
CUTTER LINEAR ENDO 35 ETS TH (STAPLE) IMPLANT
DECANTER SPIKE VIAL GLASS SM (MISCELLANEOUS) ×2 IMPLANT
DISSECTOR BLUNT TIP ENDO 5MM (MISCELLANEOUS) IMPLANT
DURAPREP 26ML APPLICATOR (WOUND CARE) ×2 IMPLANT
ELECT REM PT RETURN 9FT ADLT (ELECTROSURGICAL) ×2
ELECTRODE REM PT RTRN 9FT ADLT (ELECTROSURGICAL) ×1 IMPLANT
FILTER SMOKE EVAC LAPAROSHD (FILTER) ×2 IMPLANT
FORMALIN 10 PREFIL 120ML (MISCELLANEOUS) ×2 IMPLANT
GLOVE BIO SURGEON STRL SZ7.5 (GLOVE) ×2 IMPLANT
GLOVE ECLIPSE 7.0 STRL STRAW (GLOVE) ×1 IMPLANT
GLOVE EXAM NITRILE MD LF STRL (GLOVE) ×1 IMPLANT
GLOVE INDICATOR 7.5 STRL GRN (GLOVE) ×2 IMPLANT
GOWN STRL REIN XL XLG (GOWN DISPOSABLE) ×4 IMPLANT
INST SET LAPROSCOPIC AP (KITS) ×2 IMPLANT
IV NS IRRIG 3000ML ARTHROMATIC (IV SOLUTION) IMPLANT
KIT ROOM TURNOVER APOR (KITS) ×2 IMPLANT
MANIFOLD NEPTUNE II (INSTRUMENTS) ×2 IMPLANT
NDL INSUFFLATION 14GA 120MM (NEEDLE) ×1 IMPLANT
NEEDLE INSUFFLATION 14GA 120MM (NEEDLE) ×2 IMPLANT
NS IRRIG 1000ML POUR BTL (IV SOLUTION) ×2 IMPLANT
PACK LAP CHOLE LZT030E (CUSTOM PROCEDURE TRAY) ×2 IMPLANT
PAD ARMBOARD 7.5X6 YLW CONV (MISCELLANEOUS) ×2 IMPLANT
POUCH SPECIMEN RETRIEVAL 10MM (ENDOMECHANICALS) ×2 IMPLANT
RELOAD /EVU35 (ENDOMECHANICALS) IMPLANT
RELOAD 45 VASCULAR/THIN (ENDOMECHANICALS) IMPLANT
RELOAD CUTTER ETS 35MM STAND (ENDOMECHANICALS) IMPLANT
RELOAD STAPLE 45 2.5 WHT GRN (ENDOMECHANICALS) IMPLANT
RELOAD STAPLE 45 3.5 BLU ETS (ENDOMECHANICALS) IMPLANT
RELOAD STAPLE TA45 3.5 REG BLU (ENDOMECHANICALS) ×2 IMPLANT
SCALPEL HARMONIC ACE (MISCELLANEOUS) ×2 IMPLANT
SET BASIN LINEN APH (SET/KITS/TRAYS/PACK) ×2 IMPLANT
SET TUBE IRRIG SUCTION NO TIP (IRRIGATION / IRRIGATOR) IMPLANT
SPONGE GAUZE 2X2 8PLY STRL LF (GAUZE/BANDAGES/DRESSINGS) ×6 IMPLANT
STAPLER VISISTAT (STAPLE) ×2 IMPLANT
SUT VICRYL 0 UR6 27IN ABS (SUTURE) ×2 IMPLANT
TAPE CLOTH SURG 4X10 WHT LF (GAUZE/BANDAGES/DRESSINGS) ×1 IMPLANT
TRAY FOLEY CATH 16FR SILVER (SET/KITS/TRAYS/PACK) ×2 IMPLANT
TROCAR ENDO BLADELESS 11MM (ENDOMECHANICALS) ×2 IMPLANT
TROCAR ENDO BLADELESS 12MM (ENDOMECHANICALS) ×2 IMPLANT
TROCAR XCEL NON-BLD 5MMX100MML (ENDOMECHANICALS) ×2 IMPLANT
TUBING INSUFFLATION (TUBING) ×2 IMPLANT
WARMER LAPAROSCOPE (MISCELLANEOUS) ×2 IMPLANT
YANKAUER SUCT 12FT TUBE ARGYLE (SUCTIONS) ×2 IMPLANT

## 2013-10-25 NOTE — ED Notes (Signed)
Pt reports RLQ pain that began last Friday, pt was seen by her PCP today and referred to this facility for CT scan - pt w/ acute appendicitis per CT report. Pt admits to some nausea, denies any vomiting or fever - pain worse w/ ambulation and movement. Pt A&Ox4, in no acute distress, skin warm and dry at present.

## 2013-10-25 NOTE — Transfer of Care (Signed)
Immediate Anesthesia Transfer of Care Note  Patient: Holly Rodriguez  Procedure(s) Performed: Procedure(s): APPENDECTOMY LAPAROSCOPIC (N/A)  Patient Location: PACU  Anesthesia Type:General  Level of Consciousness: awake, alert , oriented and patient cooperative  Airway & Oxygen Therapy: Patient Spontanous Breathing and Patient connected to face mask oxygen  Post-op Assessment: Report given to PACU RN and Post -op Vital signs reviewed and stable  Post vital signs: Reviewed and stable  Complications: No apparent anesthesia complications

## 2013-10-25 NOTE — Progress Notes (Signed)
   Subjective:    Patient ID: Holly Rodriguez, female    DOB: 1966-04-26, 47 y.o.   MRN: [id number]  HPI ABDOMINAL PAIN Location: RLQ  Onset: 2-3 days  Description: recurrent RLQ pain  Modifying factors: baseline hx/o multiple abdominal surgeries, baseline ?ventral hernia    Symptoms Nausea/Vomiting: nausea  Diarrhea: no Constipation: no Melena/BRBPR: no Hematemesis: no Anorexia: no Fever/Chills: no Jaundice: no Dysuria: no Back pain: no Rash: no Weight loss: no Vaginal bleeding: currently on menstrual cycle  STD exposure: no LMP: current  Alcohol use: no NSAID use: no  PMH Past Surgeries: yes; multiple abdominal surgeries.       Review of Systems  All other systems reviewed and are negative.       Objective:   Physical Exam  Constitutional: She is oriented to person, place, and time.  Obese   HENT:  Head: Normocephalic and atraumatic.  Eyes: Conjunctivae are normal. Pupils are equal, round, and reactive to light.  Neck: Normal range of motion. Neck supple.  Cardiovascular: Normal rate and regular rhythm.   Pulmonary/Chest: Effort normal and breath sounds normal.  Abdominal: Soft. Bowel sounds are normal.  + RLQ TTP reproducible on exam.   Musculoskeletal: Normal range of motion.  Neurological: She is alert and oriented to person, place, and time.  Skin: Skin is warm.          Assessment & Plan:  Abdominal pain - Plan: POCT urinalysis dipstick, POCT UA - Microscopic Only, POCT CBC, Comprehensive metabolic panel, CT Abdomen Pelvis W Contrast, POCT urine pregnancy, Lipase  DDx fairly broad, though higher concern for possible post surgical issues like SBO vs. Appendicitis vs. Incarcerated hernia?Marland Kitchen GYN and abd wall pathologies are also on the DDx, though somewhat lower.  Will send pt for CT abd and pelvis with contrast to better assess anatomy.  Check baseline labs including CBC, CMET, lipase. Discussed general and infectious/GI reds flags at length.    Follow up pending imaging.

## 2013-10-25 NOTE — ED Provider Notes (Signed)
CSN: [id number]     Arrival date & time 10/25/13  1907 History   First MD Initiated Contact with Patient 10/25/13 1913     Chief Complaint  Patient presents with  . Abdominal Pain    HPI Pt was seen at 1925.  Per pt, c/o gradual onset and persistence of constant generalized, now RLQ, abd "pain" for the past 3 days.  Has been associated with nausea.  Describes the pain as "cramping." States she was evaluated by her PMD today, sent to the hospital for an outpatient CT scan to r/o appendicitis. Pt's CT scan returned positive for appendicitis and she was sent to the ED for further evaluation and admission.  Denies vomiting/diarrhea, no fevers, no back pain, no rash, no CP/SOB, no black or blood in stools.     Last PO intake approx 1030am today Past Medical History  Diagnosis Date  . GERD (gastroesophageal reflux disease)   . Thyroid disease   . Anxiety   . IBS (irritable bowel syndrome)    Past Surgical History  Procedure Laterality Date  . Cesarean section    . Tonsillectomy    . Gastric restriction surgery    . Tubal ligation    . Cholecystectomy     Family History  Problem Relation Age of Onset  . Arthritis Mother   . Fibromyalgia Mother   . Hyperlipidemia Mother   . Arthritis Maternal Grandmother   . Heart disease Maternal Grandmother   . Alzheimer's disease Maternal Grandfather    History  Substance Use Topics  . Smoking status: Never Smoker   . Smokeless tobacco: Not on file  . Alcohol Use: Yes     Comment: occasional    Review of Systems ROS: Statement: All systems negative except as marked or noted in the HPI; Constitutional: Negative for fever and chills. ; ; Eyes: Negative for eye pain, redness and discharge. ; ; ENMT: Negative for ear pain, hoarseness, nasal congestion, sinus pressure and sore throat. ; ; Cardiovascular: Negative for chest pain, palpitations, diaphoresis, dyspnea and peripheral edema. ; ; Respiratory: Negative for cough, wheezing and stridor. ; ;  Gastrointestinal: +nausea, abd pain. Negative for vomiting, diarrhea, blood in stool, hematemesis, jaundice and rectal bleeding. . ; ; Genitourinary: Negative for dysuria, flank pain and hematuria. ; ; Musculoskeletal: Negative for back pain and neck pain. Negative for swelling and trauma.; ; Skin: Negative for pruritus, rash, abrasions, blisters, bruising and skin lesion.; ; Neuro: Negative for headache, lightheadedness and neck stiffness. Negative for weakness, altered level of consciousness , altered mental status, extremity weakness, paresthesias, involuntary movement, seizure and syncope.       Allergies  Review of patient's allergies indicates no known allergies.  Home Medications   Current Outpatient Rx  Name  Route  Sig  Dispense  Refill  . ALPRAZolam (XANAX) 0.5 MG tablet   Oral   Take 1 tablet (0.5 mg total) by mouth at bedtime as needed for anxiety.   60 tablet   3   . furosemide (LASIX) 40 MG tablet   Oral   Take 1 tablet (40 mg total) by mouth daily.   90 tablet   3   . hyoscyamine (LEVBID) 0.375 MG 12 hr tablet   Oral   Take 1 tablet (0.375 mg total) by mouth 2 (two) times daily.   60 tablet   11     May substitute   . levothyroxine (SYNTHROID) 75 MCG tablet   Oral   Take 1 tablet (75  mcg total) by mouth daily before breakfast.   90 tablet   3   . omeprazole (PRILOSEC) 40 MG capsule   Oral   Take 1 capsule (40 mg total) by mouth daily.   90 capsule   3   . potassium chloride (KLOR-CON 10) 10 MEQ tablet   Oral   Take 1 tablet (10 mEq total) by mouth daily.   90 tablet   3   . metFORMIN (GLUCOPHAGE) 500 MG tablet   Oral   Take 1 tablet (500 mg total) by mouth 2 (two) times daily with a meal.   180 tablet   3   . sertraline (ZOLOFT) 50 MG tablet   Oral   Take 1 tablet (50 mg total) by mouth daily.   30 tablet   11    BP 123/61  Pulse 84  Temp(Src) 98.1 F (36.7 C) (Oral)  Resp 16  Ht 5\' 3"  (1.6 m)  Wt 275 lb (124.739 kg)  BMI 48.73  kg/m2  SpO2 94%  LMP 10/24/2013 Physical Exam 1930: Physical examination:  Nursing notes reviewed; Vital signs and O2 SAT reviewed;  Constitutional: Well developed, Well nourished, Well hydrated, In no acute distress; Head:  Normocephalic, atraumatic; Eyes: EOMI, PERRL, No scleral icterus; ENMT: Mouth and pharynx normal, Mucous membranes moist; Neck: Supple, Full range of motion, No lymphadenopathy; Cardiovascular: Regular rate and rhythm, No murmur, rub, or gallop; Respiratory: Breath sounds clear & equal bilaterally, No rales, rhonchi, wheezes.  Speaking full sentences with ease, Normal respiratory effort/excursion; Chest: Nontender, Movement normal; Abdomen: Soft, +RLQ tenderness to palp. Nondistended, Normal bowel sounds; Genitourinary: No CVA tenderness; Extremities: Pulses normal, No tenderness, No edema, No calf edema or asymmetry.; Neuro: AA&Ox3, Major CN grossly intact.  Speech clear. No gross focal motor or sensory deficits in extremities.; Skin: Color normal, Warm, Dry.   ED Course  Procedures   EKG Interpretation   None       MDM  MDM Reviewed: previous chart, nursing note and vitals Reviewed previous: labs Interpretation: labs and CT scan   Results for orders placed during the hospital encounter of 10/25/13  CBC WITH DIFFERENTIAL      Result Value Range   WBC 13.3 (*) 4.0 - 10.5 K/uL   RBC 4.50  3.87 - 5.11 MIL/uL   Hemoglobin 12.9  12.0 - 15.0 g/dL   HCT 16.1  09.6 - 04.5 %   MCV 86.4  78.0 - 100.0 fL   MCH 28.7  26.0 - 34.0 pg   MCHC 33.2  30.0 - 36.0 g/dL   RDW 40.9  81.1 - 91.4 %   Platelets 315  150 - 400 K/uL   Neutrophils Relative % 60  43 - 77 %   Neutro Abs 8.2 (*) 1.7 - 7.7 K/uL   Lymphocytes Relative 31  12 - 46 %   Lymphs Abs 4.1 (*) 0.7 - 4.0 K/uL   Monocytes Relative 6  3 - 12 %   Monocytes Absolute 0.8  0.1 - 1.0 K/uL   Eosinophils Relative 2  0 - 5 %   Eosinophils Absolute 0.2  0.0 - 0.7 K/uL   Basophils Relative 1  0 - 1 %   Basophils  Absolute 0.1  0.0 - 0.1 K/uL  BASIC METABOLIC PANEL      Result Value Range   Sodium 136  135 - 145 mEq/L   Potassium 3.2 (*) 3.5 - 5.1 mEq/L   Chloride 94 (*) 96 - 112 mEq/L  CO2 26  19 - 32 mEq/L   Glucose, Bld 155 (*) 70 - 99 mg/dL   BUN 8  6 - 23 mg/dL   Creatinine, Ser 4.09  0.50 - 1.10 mg/dL   Calcium 8.9  8.4 - 81.1 mg/dL   GFR calc non Af Amer >90  >90 mL/min   GFR calc Af Amer >90  >90 mL/min   Ct Abdomen Pelvis W Contrast 10/25/2013   CLINICAL DATA:  Right lower quadrant pain.  EXAM: CT ABDOMEN AND PELVIS WITH CONTRAST  TECHNIQUE: Multidetector CT imaging of the abdomen and pelvis was performed using the standard protocol following bolus administration of intravenous contrast.  CONTRAST:  OMNIPAQUE IOHEXOL 300 MG/ML  SOLN  COMPARISON:  04/11/2009  FINDINGS: Postop changes again seen from previous gastric bypass surgery. Hepatic steatosis remain stable. No liver masses are identified. Surgical clips from prior cholecystectomy noted. No evidence of biliary ductal dilatation.  The spleen, pancreas, adrenal glands, and kidneys are normal in appearance. No evidence of hydronephrosis. Left uterine fibroid is increased in size since previous study, currently measuring approximately 4 cm. No other masses or lymphadenopathy identified.  The appendix shows diffuse wall thickening and mild periappendiceal inflammatory change, consistent with acute appendicitis. No evidence of abscess or free fluid. No evidence of bowel obstruction.  A small paraumbilical hernia is again seen containing only fat. No evidence herniated bowel loops.  IMPRESSION: Positive for acute appendicitis.  No evidence of abscess or other complication.  Stable hepatic steatosis and small paraumbilical hernia containing only fat.   Electronically Signed   By: Myles Rosenthal M.D.   On: 10/25/2013 18:54      1955:  Dx and testing d/w pt and family.  Questions answered.  Verb understanding, agreeable to admit.  T/C to General  Surgeon Dr. Lovell Sheehan, case discussed, including:  HPI, pertinent PM/SHx, VS/PE, dx testing, ED course and treatment:  Agreeable to admit, requests to write temporary orders, obtain medical bed.   Laray Anger, DO 10/27/13 1719

## 2013-10-25 NOTE — Anesthesia Preprocedure Evaluation (Signed)
Anesthesia Evaluation  Patient identified by MRN, date of birth, ID band Patient awake    Reviewed: Allergy & Precautions, H&P , NPO status , Patient's Chart, lab work & pertinent test results, reviewed documented beta blocker date and time   Airway Mallampati: II TM Distance: >3 FB Neck ROM: full    Dental  (+) Teeth Intact   Pulmonary  breath sounds clear to auscultation        Cardiovascular Rhythm:regular     Neuro/Psych    GI/Hepatic GERD-  ,  Endo/Other  diabetes  Renal/GU      Musculoskeletal   Abdominal   Peds  Hematology   Anesthesia Other Findings   Reproductive/Obstetrics negative OB ROS                           Anesthesia Physical Anesthesia Plan  ASA: II and emergent  Anesthesia Plan: Rapid Sequence, Cricoid Pressure and General ETT   Post-op Pain Management:    Induction:   Airway Management Planned:   Additional Equipment:   Intra-op Plan:   Post-operative Plan:   Informed Consent: I have reviewed the patients History and Physical, chart, labs and discussed the procedure including the risks, benefits and alternatives for the proposed anesthesia with the patient or authorized representative who has indicated his/her understanding and acceptance.   Dental Advisory Given  Plan Discussed with: Anesthesiologist and Surgeon  Anesthesia Plan Comments:         Anesthesia Quick Evaluation

## 2013-10-25 NOTE — ED Notes (Signed)
Pt reports she saw physician today and was sent for CT.  Pt does have appendicitis per CT report.  Pt denies nausea or vomiting.  Reports RLQ pain "only when I move around a lot."

## 2013-10-25 NOTE — Anesthesia Postprocedure Evaluation (Signed)
  Anesthesia Post-op Note  Patient: Holly Rodriguez  Procedure(s) Performed: Procedure(s): APPENDECTOMY LAPAROSCOPIC (N/A)  Patient Location: PACU  Anesthesia Type:General  Level of Consciousness: awake, alert , oriented and patient cooperative  Airway and Oxygen Therapy: Patient Spontanous Breathing and Patient connected to face mask oxygen  Post-op Pain: mild  Post-op Assessment: Post-op Vital signs reviewed, Patient's Cardiovascular Status Stable, Respiratory Function Stable, Patent Airway, No signs of Nausea or vomiting and Pain level controlled  Post-op Vital Signs: Reviewed and stable  Complications: No apparent anesthesia complications

## 2013-10-25 NOTE — H&P (Signed)
Holly Rodriguez is an 47 y.o. female.   Chief Complaint: Lower abdominal pain HPI: Patient is a 47 year old white female who began experiencing nonspecific abdominal pain 3 days ago. She presented to her primary care physician who found that she had an elevated white blood cell count. He sent patient over for a CT scan the abdomen which reveals acute appendicitis.  Past Medical History  Diagnosis Date  . GERD (gastroesophageal reflux disease)   . Thyroid disease   . Anxiety   . IBS (irritable bowel syndrome)     Past Surgical History  Procedure Laterality Date  . Cesarean section    . Tonsillectomy    . Gastric restriction surgery    . Tubal ligation    . Cholecystectomy      Family History  Problem Relation Age of Onset  . Arthritis Mother   . Fibromyalgia Mother   . Hyperlipidemia Mother   . Arthritis Maternal Grandmother   . Heart disease Maternal Grandmother   . Alzheimer's disease Maternal Grandfather    Social History:  reports that she has never smoked. She does not have any smokeless tobacco history on file. She reports that she drinks alcohol. She reports that she does not use illicit drugs.  Allergies: No Known Allergies   (Not in a hospital admission)  Results for orders placed during the hospital encounter of 10/25/13 (from the past 48 hour(s))  CBC WITH DIFFERENTIAL     Status: Abnormal   Collection Time    10/25/13  7:40 PM      Result Value Range   WBC 13.3 (*) 4.0 - 10.5 K/uL   RBC 4.50  3.87 - 5.11 MIL/uL   Hemoglobin 12.9  12.0 - 15.0 g/dL   HCT 16.1  09.6 - 04.5 %   MCV 86.4  78.0 - 100.0 fL   MCH 28.7  26.0 - 34.0 pg   MCHC 33.2  30.0 - 36.0 g/dL   RDW 40.9  81.1 - 91.4 %   Platelets 315  150 - 400 K/uL   Neutrophils Relative % 60  43 - 77 %   Neutro Abs 8.2 (*) 1.7 - 7.7 K/uL   Lymphocytes Relative 31  12 - 46 %   Lymphs Abs 4.1 (*) 0.7 - 4.0 K/uL   Monocytes Relative 6  3 - 12 %   Monocytes Absolute 0.8  0.1 - 1.0 K/uL   Eosinophils  Relative 2  0 - 5 %   Eosinophils Absolute 0.2  0.0 - 0.7 K/uL   Basophils Relative 1  0 - 1 %   Basophils Absolute 0.1  0.0 - 0.1 K/uL  BASIC METABOLIC PANEL     Status: Abnormal   Collection Time    10/25/13  7:40 PM      Result Value Range   Sodium 136  135 - 145 mEq/L   Potassium 3.2 (*) 3.5 - 5.1 mEq/L   Chloride 94 (*) 96 - 112 mEq/L   CO2 26  19 - 32 mEq/L   Glucose, Bld 155 (*) 70 - 99 mg/dL   BUN 8  6 - 23 mg/dL   Creatinine, Ser 7.82  0.50 - 1.10 mg/dL   Calcium 8.9  8.4 - 95.6 mg/dL   GFR calc non Af Amer >90  >90 mL/min   GFR calc Af Amer >90  >90 mL/min   Comment: (NOTE)     The eGFR has been calculated using the CKD EPI equation.  This calculation has not been validated in all clinical situations.     eGFR's persistently <90 mL/min signify possible Chronic Kidney     Disease.   Ct Abdomen Pelvis W Contrast  10/25/2013   CLINICAL DATA:  Right lower quadrant pain.  EXAM: CT ABDOMEN AND PELVIS WITH CONTRAST  TECHNIQUE: Multidetector CT imaging of the abdomen and pelvis was performed using the standard protocol following bolus administration of intravenous contrast.  CONTRAST:  OMNIPAQUE IOHEXOL 300 MG/ML  SOLN  COMPARISON:  04/11/2009  FINDINGS: Postop changes again seen from previous gastric bypass surgery. Hepatic steatosis remain stable. No liver masses are identified. Surgical clips from prior cholecystectomy noted. No evidence of biliary ductal dilatation.  The spleen, pancreas, adrenal glands, and kidneys are normal in appearance. No evidence of hydronephrosis. Left uterine fibroid is increased in size since previous study, currently measuring approximately 4 cm. No other masses or lymphadenopathy identified.  The appendix shows diffuse wall thickening and mild periappendiceal inflammatory change, consistent with acute appendicitis. No evidence of abscess or free fluid. No evidence of bowel obstruction.  A small paraumbilical hernia is again seen containing only  fat. No evidence herniated bowel loops.  IMPRESSION: Positive for acute appendicitis.  No evidence of abscess or other complication.  Stable hepatic steatosis and small paraumbilical hernia containing only fat.   Electronically Signed   By: Myles Rosenthal M.D.   On: 10/25/2013 18:54    Review of Systems  Constitutional: Positive for malaise/fatigue.  Respiratory: Negative.   Cardiovascular: Negative.   Gastrointestinal: Positive for nausea and abdominal pain.  Genitourinary: Negative.   Musculoskeletal: Negative.   All other systems reviewed and are negative.    Blood pressure 123/61, pulse 84, temperature 98.1 F (36.7 C), temperature source Oral, resp. rate 16, height 5\' 3"  (1.6 m), weight 124.739 kg (275 lb), last menstrual period 10/24/2013, SpO2 94.00%. Physical Exam  Vitals reviewed. Constitutional: She is oriented to person, place, and time. She appears well-developed and well-nourished.  HENT:  Head: Normocephalic and atraumatic.  Neck: Normal range of motion. Neck supple.  Cardiovascular: Normal rate, regular rhythm and normal heart sounds.   Respiratory: Effort normal and breath sounds normal.  GI: Soft. There is tenderness. There is no rebound.  Tender in the right lower quadrant to deep palpation. No rigidity noted. Upper midline surgical scar noted.  Neurological: She is alert and oriented to person, place, and time.  Skin: Skin is warm and dry.     Assessment/Plan Impression: Acute appendicitis Plan: Patient will be taken to the operating room for laparoscopic appendectomy. The risks and benefits of the procedure including bleeding, infection, and the possibility of an open procedure were fully explained to the patient, who gave informed consent.  Holly Rodriguez A 10/25/2013, 8:47 PM

## 2013-10-25 NOTE — Op Note (Signed)
Patient:  Holly Rodriguez  DOB:  Mar 28, 1966  MRN:  [id number]   Preop Diagnosis:  Acute appendicitis  Postop Diagnosis:  Same  Procedure:  Laparoscopic appendectomy  Surgeon:  Franky Macho, M.D.  Anes:  General endotracheal  Indications:  Patient is a 47 year old morbidly obese white female presents with a three-day history of worsening lower abdominal pain. CT scan the abdomen reveals acute appendicitis. The patient now comes to the operating room for laparoscopic appendectomy. The risks and benefits of the procedure including bleeding, infection, and the possibility of an open procedure were fully explained to the patient, who gave informed consent.  Procedure note:  The patient is placed the supine position. After induction of general endotracheal anesthesia, the abdomen was prepped and draped using usual sterile technique with DuraPrep. Surgical site confirmation was performed.  A supraumbilical incision was made down to the fascia. A Veress needle was introduced into the abdominal cavity and confirmation of placement was done using the saline drop test. The abdomen was then insufflated to 16 mm mercury pressure. An 11 mm trocar was introduced into the abdominal cavity under direct visualization without difficulty. The patient was placed in deeper Trendelenburg position and additional 12 mm trocar was placed the suprapubic region and a 5 mm trocar was placed left lower quadrant region. The right lower quadrant was inspected and a significant amount of inflamed, indurated tissue was noted in the right lower corner. The appendix was visualized and both the mesentery of the appendix as well as the appendix itself was swollen and erythematous with a thickened wall. There was no evidence of perforation present. The appendiceal wall was thick up to its base. The mesoappendix was divided using the harmonic scalpel. A standard Endo GIA was then placed along the base the appendix and fired. The appendix  was then removed using an Endo Catch bag and sent to pathology for further examination. The staple line was inspected and noted within normal limits. A bleeding was controlled using Bovie electrocautery. All fluid and air were then evacuated from the abdominal cavity prior to removal of the trochars. All wounds were irrigated with normal saline. All wounds were injected with 0.5% Sensorcaine. The supraumbilical fascia was reapproximated using an 0 Vicryl interrupted suture. All skin incisions were closed using staples. Betadine ointment and dry sterile dressings were applied.  All tape and needle counts were correct at the end of the procedure. The patient was extubated in the operating room and transferred to PACU in stable condition.  Complications:  None  EBL:  Minimal  Specimen:  Appendix

## 2013-10-25 NOTE — Anesthesia Procedure Notes (Signed)
Procedure Name: Intubation Date/Time: 10/25/2013 9:42 PM Performed by: Pernell Dupre, AMY A Pre-anesthesia Checklist: Patient identified, Patient being monitored, Timeout performed, Emergency Drugs available and Suction available Patient Re-evaluated:Patient Re-evaluated prior to inductionOxygen Delivery Method: Circle System Utilized Preoxygenation: Pre-oxygenation with 100% oxygen Intubation Type: IV induction Ventilation: Mask ventilation without difficulty Laryngoscope Size: 3 and Miller Grade View: Grade I Tube type: Oral Tube size: 7.0 mm Number of attempts: 1 Airway Equipment and Method: stylet Placement Confirmation: ETT inserted through vocal cords under direct vision,  positive ETCO2 and breath sounds checked- equal and bilateral Secured at: 21 cm Tube secured with: Tape Dental Injury: Teeth and Oropharynx as per pre-operative assessment

## 2013-10-26 ENCOUNTER — Encounter: Payer: Self-pay | Admitting: *Deleted

## 2013-10-26 LAB — COMPREHENSIVE METABOLIC PANEL
Albumin/Globulin Ratio: 1.5 (ref 1.1–2.5)
Albumin: 4.5 g/dL (ref 3.5–5.5)
Alkaline Phosphatase: 117 IU/L (ref 39–117)
BUN: 9 mg/dL (ref 6–24)
CO2: 29 mmol/L (ref 18–29)
Chloride: 95 mmol/L — ABNORMAL LOW (ref 97–108)
Creatinine, Ser: 0.67 mg/dL (ref 0.57–1.00)
GFR calc Af Amer: 121 mL/min/{1.73_m2} (ref >59)
Globulin, Total: 3 g/dL (ref 1.5–4.5)
Glucose: 147 mg/dL — ABNORMAL HIGH (ref 65–99)
Total Bilirubin: 0.3 mg/dL (ref 0.0–1.2)
Total Protein: 7.5 g/dL (ref 6.0–8.5)

## 2013-10-26 LAB — GLUCOSE, CAPILLARY
Glucose-Capillary: 162 mg/dL — ABNORMAL HIGH (ref 70–99)
Glucose-Capillary: 182 mg/dL — ABNORMAL HIGH (ref 70–99)
Glucose-Capillary: 136 mg/dL — ABNORMAL HIGH (ref 70–99)

## 2013-10-26 LAB — BASIC METABOLIC PANEL
CO2: 27 mEq/L (ref 19–32)
Calcium: 8.1 mg/dL — ABNORMAL LOW (ref 8.4–10.5)
Chloride: 97 mEq/L (ref 96–112)
Creatinine, Ser: 0.56 mg/dL (ref 0.50–1.10)

## 2013-10-26 LAB — CBC
HCT: 34.5 % — ABNORMAL LOW (ref 36.0–46.0)
MCV: 86.9 fL (ref 78.0–100.0)
RDW: 15.2 % (ref 11.5–15.5)
WBC: 13.2 10*3/uL — ABNORMAL HIGH (ref 4.0–10.5)

## 2013-10-26 LAB — LIPASE: Lipase: 12 U/L (ref 0–59)

## 2013-10-26 LAB — HEMOGLOBIN A1C: Hgb A1c MFr Bld: 7.9 % — ABNORMAL HIGH (ref <5.7)

## 2013-10-26 MED ORDER — PROMETHAZINE HCL 50 MG PO TABS: 25.0000 mg | ORAL_TABLET | Freq: Four times a day (QID) | ORAL | Status: DC | PRN

## 2013-10-26 MED ORDER — HYDROCODONE-ACETAMINOPHEN 5-325 MG PO TABS: 1.0000 | ORAL_TABLET | ORAL | Status: DC | PRN

## 2013-10-26 NOTE — Progress Notes (Signed)
Patient being d/c home with prescriptions. Verbalizes understanding. IV cath removed and intact. No pain/swelling at site.  

## 2013-10-26 NOTE — Telephone Encounter (Signed)
This encounter was created in error - please disregard.

## 2013-10-26 NOTE — Discharge Summary (Signed)
Physician Discharge Summary  Patient ID: Holly Rodriguez MRN: [id number] DOB/AGE: Apr 13, 1966 47 y.o.  Admit date: 10/25/2013 Discharge date: 10/26/2013  Admission Diagnoses: Acute appendicitis  Discharge Diagnoses: Same Active Problems:   Acute appendicitis   Discharged Condition: good  Hospital Course: Patient is a 47 year old morbidly obese white female who presented emergency room with a three-day history of worsening lower, pain. CT scan had been ordered as an outpatient and revealed acute appendicitis. She was taken to the operating room and underwent a laparoscopic appendectomy on 10/25/2013. She tolerated the procedure well. Her postoperative course has been unremarkable. She is being discharged home in good improving condition.  Treatments: surgery: Laparoscopic appendectomy on 10/25/2013  Discharge Exam: Blood pressure 126/85, pulse 88, temperature 97.8 F (36.6 C), temperature source Oral, resp. rate 18, height 5\' 3"  (1.6 m), weight 124.739 kg (275 lb), last menstrual period 10/24/2013, SpO2 94.00%. General appearance: alert, cooperative and no distress Resp: clear to auscultation bilaterally Cardio: regular rate and rhythm, S1, S2 normal, no murmur, click, rub or gallop GI: Soft. Dressings dry and intact.  Disposition: Home    Medication List    STOP taking these medications       metFORMIN 500 MG tablet  Commonly known as:  GLUCOPHAGE      TAKE these medications       ALIVE WOMENS ENERGY PO  Take 1 tablet by mouth daily.     ALPRAZolam 0.5 MG tablet  Commonly known as:  XANAX  Take 1 tablet (0.5 mg total) by mouth at bedtime as needed for anxiety.     B-COMPLEX PO  Take 1 tablet by mouth daily.     cholecalciferol 1000 UNITS tablet  Commonly known as:  VITAMIN D  Take 1,000 Units by mouth daily.     furosemide 40 MG tablet  Commonly known as:  LASIX  Take 1 tablet (40 mg total) by mouth daily.     HYDROcodone-acetaminophen 5-325 MG per tablet   Commonly known as:  NORCO/VICODIN  Take 1-2 tablets by mouth every 4 (four) hours as needed for moderate pain.     hyoscyamine 0.375 MG 12 hr tablet  Commonly known as:  LEVBID  Take 1 tablet (0.375 mg total) by mouth 2 (two) times daily.     levothyroxine 75 MCG tablet  Commonly known as:  SYNTHROID  Take 1 tablet (75 mcg total) by mouth daily before breakfast.     OMEGA 3 PO  Take 1 capsule by mouth daily.     omeprazole 40 MG capsule  Commonly known as:  PRILOSEC  Take 1 capsule (40 mg total) by mouth daily.     potassium chloride 10 MEQ tablet  Commonly known as:  KLOR-CON 10  Take 1 tablet (10 mEq total) by mouth daily.     promethazine 50 MG tablet  Commonly known as:  PHENERGAN  Take 0.5 tablets (25 mg total) by mouth every 6 (six) hours as needed for nausea or vomiting.     sertraline 50 MG tablet  Commonly known as:  ZOLOFT  Take 1 tablet (50 mg total) by mouth daily.           Follow-up Information   Follow up with Dalia Heading, MD. Schedule an appointment as soon as possible for a visit on 11/04/2013.   Specialty:  General Surgery   Contact information:   [address] [place] [address] [phone]       Signed: Franky Macho A 10/26/2013, 5:39 PM

## 2013-10-26 NOTE — Progress Notes (Signed)
Utilization Review Complete  

## 2013-10-26 NOTE — Anesthesia Postprocedure Evaluation (Signed)
  Anesthesia Post-op Note  Patient: Holly Rodriguez  Procedure(s) Performed: Procedure(s): APPENDECTOMY LAPAROSCOPIC (N/A)  Patient Location: Nursing Unit  Anesthesia Type:General  Level of Consciousness: awake, alert  and oriented  Airway and Oxygen Therapy: Patient Spontanous Breathing  Post-op Pain: mild  Post-op Assessment: Post-op Vital signs reviewed, Patient's Cardiovascular Status Stable, Respiratory Function Stable and Patent Airway, antiemetic this am for nausea, none through the night.  Post-op Vital Signs: Reviewed and stable  Complications: No apparent anesthesia complications

## 2013-10-27 ENCOUNTER — Encounter (HOSPITAL_COMMUNITY): Payer: Self-pay | Admitting: General Surgery

## 2013-11-08 ENCOUNTER — Telehealth: Payer: Self-pay | Admitting: *Deleted

## 2013-11-08 NOTE — Telephone Encounter (Signed)
Pt notified and verbalized understanding.

## 2013-11-08 NOTE — Telephone Encounter (Signed)
Message copied by Baltazar Apo on Mon Nov 08, 2013  2:24 PM ------      Message from: Deatra Canter      Created: Thu Oct 21, 2013 10:46 AM       Patient is diabetic and have sent in rx of metformin 500mg  one po bid.  Recommend follow up with Tammy Eckerd Pharm D for diabetes visit. ------

## 2013-11-08 NOTE — Telephone Encounter (Signed)
Message copied by Baltazar Apo on Mon Nov 08, 2013  2:31 PM ------      Message from: Deatra Canter      Created: Thu Oct 21, 2013 10:46 AM       Patient is diabetic and have sent in rx of metformin 500mg  one po bid.  Recommend follow up with Tammy Eckerd Pharm D for diabetes visit. ------

## 2013-11-10 ENCOUNTER — Telehealth: Payer: Self-pay | Admitting: General Practice

## 2013-11-10 NOTE — Telephone Encounter (Signed)
Left message for patient that need to know what type of meter she has in order to call in rx

## 2013-11-10 NOTE — Telephone Encounter (Signed)
Needs lancets and test strips sent to [facility] for new machine

## 2013-11-16 ENCOUNTER — Other Ambulatory Visit: Payer: Self-pay | Admitting: Nurse Practitioner

## 2013-11-16 MED ORDER — GLUCOSE BLOOD VI STRP: ORAL_STRIP | Status: DC

## 2013-11-16 MED ORDER — ONETOUCH ULTRASOFT LANCETS MISC: Status: DC

## 2013-11-17 MED ORDER — GLUCOSE BLOOD VI STRP: ORAL_STRIP | Status: DC

## 2013-11-23 ENCOUNTER — Ambulatory Visit: Payer: BC Managed Care – PPO | Admitting: General Practice

## 2013-12-06 ENCOUNTER — Ambulatory Visit (INDEPENDENT_AMBULATORY_CARE_PROVIDER_SITE_OTHER): Payer: BC Managed Care – PPO | Admitting: Pharmacist

## 2013-12-06 VITALS — BP 140/90 | HR 75 | Ht 64.0 in | Wt 278.0 lb

## 2013-12-06 DIAGNOSIS — I10 Essential (primary) hypertension: Secondary | ICD-10-CM

## 2013-12-06 DIAGNOSIS — E876 Hypokalemia: Secondary | ICD-10-CM

## 2013-12-06 DIAGNOSIS — I152 Hypertension secondary to endocrine disorders: Secondary | ICD-10-CM

## 2013-12-06 DIAGNOSIS — E119 Type 2 diabetes mellitus without complications: Secondary | ICD-10-CM

## 2013-12-06 DIAGNOSIS — D649 Anemia, unspecified: Secondary | ICD-10-CM

## 2013-12-06 DIAGNOSIS — E1159 Type 2 diabetes mellitus with other circulatory complications: Secondary | ICD-10-CM

## 2013-12-06 DIAGNOSIS — E1169 Type 2 diabetes mellitus with other specified complication: Secondary | ICD-10-CM

## 2013-12-06 MED ORDER — LISINOPRIL-HYDROCHLOROTHIAZIDE 20-25 MG PO TABS: 1.0000 | ORAL_TABLET | Freq: Every day | ORAL | Status: DC

## 2013-12-06 NOTE — Progress Notes (Signed)
Diabetes Flow Sheet:  Visit 1  Chief Complaint:   Chief Complaint  Patient presents with  . Diabetes    newly diagnosed 2014    HPI:  Patient diagnoses with type 2 DM 10/2013.  She is here today for education and medication changes if needed.  She is currently taking metformin 571m 1 tablet bid for diabetes.    There is not history of gestation diabetes and only family history of DM is possibly paternal grandfather.   Low fat/carbohydrate diet?  No Nicotine Abuse?  No Medication Compliance?  Yes Exercise?  No Alcohol Abuse?  No    Exam Filed Weights   12/07/13 1137  Weight: 278 lb (126.1 kg)   Filed Vitals:   12/07/13 1137  BP: 140/90  Pulse: 75    Edema:  Negative today but patient dose report history of edema for which she has taken furosemide for about 10 years Polyuria:  negative  Polydipsia:  occassionally Polyphagia:  negative  BMI:  Body mass index is 47.7 kg/(m^2).   Weight changes:  stable General Appearance:  alert, oriented, no acute distress and obese Mood/Affect:  normal    Lab Results  Component Value Date   HGBA1C 7.9* 10/26/2013    Lab Results  Component Value Date   LDLCALC 97 02/01/2013   TRIG 217* 02/01/2013     Medication Checklist: ACE Inhibitor/ARB?  No Lipid Lowering Agent?  No Aspirin?  No Oral Hypoglycemic Agent(s)?  Yes  Assessment: 1.  type 2 Diabetes.  Newly diagnosed 2.  Blood Pressure Control.  Elevated today 3.  Lipid Control.  Tg elevated, LDL-C at goal 4.  Edema - controlled currently 5.  Hypokalemia - possibly related to furosemide 6.  Low HGB   Recommendations: 1.  1500 calorie, carbohydrate counting diet.  Patient is counseled extensively on carbohydrate counting, serving sizes, saturated fat intake and meal planning.  Patient is instructed to eat 3 meals a day and 3 small snacks.  Patient will supplement snacks based on physical activity. 2.  30 minutes of physical activity.  Patient is counseled to always carry  glucose tablets, lifesavers, hard candies, etc., while exercising in case of hypoglycemic event. 3.  Patient is counseled on pathophysiology of diabetes and the risk of long-term complications.  Fasting blood glucose goals are 80-1270mdL.  Post-prandial goals are < 140.  A1C goals < 6.5%. 4.  LDL goal of < 100, HDL > 40 and TG < 150; BP goal < 130/80 5.  Patient is counseled on proper use of glucometer and lancing device.  Patient is informed how often to test and how to respond to unsuitable results. 6.  Medication recommendations at this time are as follows:  Continue metformin 50014m tablet bid  Discontinue furosemide  Start lisinopril HCTZ 20/21m22mtablet each morning - should help protect kidneys and better control BP and maybe prevent future hypokalemia.  7.  Orders Placed This Encounter  Procedures  . BMP8+EGFR  . CBC With differential/Platelet     Time spent counseling patient:  60 minutes   Referring provider:  NewtRiki RuskarmD, CPP

## 2013-12-07 ENCOUNTER — Encounter: Payer: Self-pay | Admitting: Pharmacist

## 2013-12-07 LAB — CBC WITH DIFFERENTIAL
BASOS: 1 %
Basophils Absolute: 0.1 10*3/uL (ref 0.0–0.2)
EOS: 2 %
Eosinophils Absolute: 0.3 10*3/uL (ref 0.0–0.4)
HEMATOCRIT: 38.4 % (ref 34.0–46.6)
HEMOGLOBIN: 12.6 g/dL (ref 11.1–15.9)
Immature Grans (Abs): 0 10*3/uL (ref 0.0–0.1)
Immature Granulocytes: 0 %
LYMPHS ABS: 4.4 10*3/uL — AB (ref 0.7–3.1)
LYMPHS: 39 %
MCH: 27.7 pg (ref 26.6–33.0)
MCHC: 32.8 g/dL (ref 31.5–35.7)
MCV: 84 fL (ref 79–97)
MONOCYTES: 6 %
Monocytes Absolute: 0.7 10*3/uL (ref 0.1–0.9)
NEUTROS ABS: 6.1 10*3/uL (ref 1.4–7.0)
Neutrophils Relative %: 52 %
Platelets: 348 10*3/uL (ref 150–379)
RBC: 4.55 x10E6/uL (ref 3.77–5.28)
RDW: 15.2 % (ref 12.3–15.4)
WBC: 11.5 10*3/uL — AB (ref 3.4–10.8)

## 2013-12-07 LAB — BMP8+EGFR
BUN/Creatinine Ratio: 16 (ref 9–23)
BUN: 9 mg/dL (ref 6–24)
CALCIUM: 9.1 mg/dL (ref 8.7–10.2)
CO2: 23 mmol/L (ref 18–29)
CREATININE: 0.58 mg/dL (ref 0.57–1.00)
Chloride: 95 mmol/L — ABNORMAL LOW (ref 97–108)
GFR, EST AFRICAN AMERICAN: 127 mL/min/{1.73_m2} (ref >59)
GFR, EST NON AFRICAN AMERICAN: 110 mL/min/{1.73_m2} (ref >59)
GLUCOSE: 85 mg/dL (ref 65–99)
Potassium: 3.6 mmol/L (ref 3.5–5.2)
Sodium: 142 mmol/L (ref 134–144)

## 2013-12-13 ENCOUNTER — Telehealth: Payer: Self-pay | Admitting: Pharmacist

## 2013-12-13 NOTE — Telephone Encounter (Signed)
Patient called about labs from last week - all labs were WNL except WBC a little elevated but improving.

## 2014-01-27 ENCOUNTER — Ambulatory Visit (INDEPENDENT_AMBULATORY_CARE_PROVIDER_SITE_OTHER): Payer: BC Managed Care – PPO | Admitting: Pharmacist

## 2014-01-27 ENCOUNTER — Encounter: Payer: Self-pay | Admitting: Pharmacist

## 2014-01-27 VITALS — BP 118/78 | HR 80 | Ht 64.0 in | Wt 277.8 lb

## 2014-01-27 DIAGNOSIS — E1169 Type 2 diabetes mellitus with other specified complication: Secondary | ICD-10-CM

## 2014-01-27 DIAGNOSIS — E119 Type 2 diabetes mellitus without complications: Secondary | ICD-10-CM

## 2014-01-27 DIAGNOSIS — I10 Essential (primary) hypertension: Secondary | ICD-10-CM

## 2014-01-27 DIAGNOSIS — I152 Hypertension secondary to endocrine disorders: Secondary | ICD-10-CM

## 2014-01-27 DIAGNOSIS — E1159 Type 2 diabetes mellitus with other circulatory complications: Secondary | ICD-10-CM

## 2014-01-27 LAB — POCT GLYCOSYLATED HEMOGLOBIN (HGB A1C): Hemoglobin A1C: 6.6

## 2014-01-27 NOTE — Progress Notes (Signed)
Diabetes Flow Sheet:  Follow up  Chief Complaint:   Chief Complaint  Patient presents with  . Diabetes    HPI:  Patient diagnoses with type 2 DM 10/2013.  She is here today for education and medication changes if needed.  She is currently taking metformin 564m 1 tablet bid for diabetes.     HBG readings - 120 to 180.  Check once daily.  Low fat/carbohydrate diet?  Yes - more aware of CHO in food.  No sodas, increase in salads and vegetables! Nicotine Abuse?  No Medication Compliance?  Yes Exercise?  Had increased but due to cold weather has decrease over last 2 weeks. Alcohol Abuse?  No   Exam Filed Weights   01/27/14 1542  Weight: 277 lb 12 oz (125.987 kg)   Filed Vitals:   01/27/14 1542  BP: 118/78  Pulse: 80    Edema: trace Polyuria:  negative  Polydipsia:  negative Polyphagia:  negative  BMI:  Body mass index is 47.65 kg/(m^2).   Weight changes:  stable General Appearance:  alert, oriented, no acute distress and obese Mood/Affect:  normal    Lab Results  Component Value Date   HGBA1C 6.6 01/27/2014    Lab Results  Component Value Date   LDLCALC 97 02/01/2013   TRIG 217* 02/01/2013      Assessment: 1.  type 2 Diabetes.  A1c had improved 2.  Blood Pressure Control.  At goal today since BP medication changed at last visit 3.  Lipid Control.  Tg elevated, LDL-C at goal - pt not fasting today so cannot recheck 4.  Edema - controlled currently 5.  Hypokalemia - rechecking today 6.  obesity  Recommendations: 1.  1500 calorie, carbohydrate counting diet.  Reviewed carbohydrate counting, serving sizes, saturated fat intake and meal planning.  Patient is instructed to eat 3 meals a day and 3 small snacks.  Called patient's insurance company to find out if any weight loss therapies covered and they are not.  Gave information about Contrave and phentermine to consider.  Paitnet is very motivated to lose weight. 2.  30 minutes of physical activity.  Patient is  counseled to always carry glucose tablets, lifesavers, hard candies, etc., while exercising in case of hypoglycemic event. 3.  Continue to check BG daily 4.  LDL goal of < 100, HDL > 40 and TG < 150; BP goal < 130/8 5.  Medication recommendations at this time are as follows:    Continue metformin 5056m1 tablet bid  Continue lisinopril HCTZ 20/2569m tablet each morning   7.  Orders Placed This Encounter  Procedures  . CMP14+EGFR  . POCT glycosylated hemoglobin (Hb A1C)     Time spent counseling patient:  30 minutes      TamCherre RobinsharmD, CPP

## 2014-01-27 NOTE — Patient Instructions (Signed)
Phentermine tablets or capsules What is this medicine? PHENTERMINE (FEN ter meen) decreases your appetite. It is used with a reduced calorie diet and exercise to help you lose weight. This medicine may be used for other purposes; ask your health care provider or pharmacist if you have questions. COMMON BRAND NAME(S): Adipex-P, Atti-Plex P , Atti-Plex P Spansule , Fastin, Pro-Fast, Tara-8  What should I tell my health care provider before I take this medicine? They need to know if you have any of these conditions: -agitation -glaucoma -heart disease -high blood pressure -history of substance abuse -lung disease called Primary Pulmonary Hypertension (PPH) -taken an MAOI like Carbex, Eldepryl, Marplan, Nardil, or Parnate in last 14 days -thyroid disease -an unusual or allergic reaction to phentermine, other medicines, foods, dyes, or preservatives -pregnant or trying to get pregnant -breast-feeding How should I use this medicine? Take this medicine by mouth with a glass of water. Follow the directions on the prescription label. This medicine is usually taken 30 minutes before or 1 to 2 hours after breakfast. Avoid taking this medicine in the evening. It may interfere with sleep. Take your doses at regular intervals. Do not take your medicine more often than directed. Talk to your pediatrician regarding the use of this medicine in children. Special care may be needed. Overdosage: If you think you have taken too much of this medicine contact a poison control center or emergency room at once. NOTE: This medicine is only for you. Do not share this medicine with others. What if I miss a dose? If you miss a dose, take it as soon as you can. If it is almost time for your next dose, take only that dose. Do not take double or extra doses. What may interact with this medicine? Do not take this medicine with any of the following medications: -duloxetine -MAOIs like Carbex, Eldepryl, Marplan, Nardil,  and Parnate -medicines for colds or breathing difficulties like pseudoephedrine or phenylephrine -procarbazine -sibutramine -SSRIs like citalopram, escitalopram, fluoxetine, fluvoxamine, paroxetine, and sertraline -stimulants like dexmethylphenidate, methylphenidate or modafinil -venlafaxine This medicine may also interact with the following medications: -medicines for diabetes This list may not describe all possible interactions. Give your health care provider a list of all the medicines, herbs, non-prescription drugs, or dietary supplements you use. Also tell them if you smoke, drink alcohol, or use illegal drugs. Some items may interact with your medicine. What should I watch for while using this medicine? Notify your physician immediately if you become short of breath while doing your normal activities. Do not take this medicine within 6 hours of bedtime. It can keep you from getting to sleep. Avoid drinks that contain caffeine and try to stick to a regular bedtime every night. This medicine was intended to be used in addition to a healthy diet and exercise. The best results are achieved this way. This medicine is only indicated for short-term use. Eventually your weight loss may level out. At that point, the drug will only help you maintain your new weight. Do not increase or in any way change your dose without consulting your doctor. You may get drowsy or dizzy. Do not drive, use machinery, or do anything that needs mental alertness until you know how this medicine affects you. Do not stand or sit up quickly, especially if you are an older patient. This reduces the risk of dizzy or fainting spells. Alcohol may increase dizziness and drowsiness. Avoid alcoholic drinks. What side effects may I notice from receiving this medicine?  Side effects that you should report to your doctor or health care professional as soon as possible: -chest pain, palpitations -depression or severe changes in  mood -increased blood pressure -irritability -nervousness or restlessness -severe dizziness -shortness of breath -problems urinating -unusual swelling of the legs -vomiting Side effects that usually do not require medical attention (report to your doctor or health care professional if they continue or are bothersome): -blurred vision or other eye problems -changes in sexual ability or desire -constipation or diarrhea -difficulty sleeping -dry mouth or unpleasant taste -headache -nausea This list may not describe all possible side effects. Call your doctor for medical advice about side effects. You may report side effects to FDA at 1-800-FDA-1088. Where should I keep my medicine? Keep out of the reach of children. This medicine can be abused. Keep your medicine in a safe place to protect it from theft. Do not share this medicine with anyone. Selling or giving away this medicine is dangerous and against the law. Store at room temperature between 20 and 25 degrees C (68 and 77 degrees F). Keep container tightly closed. Throw away any unused medicine after the expiration date. NOTE: This sheet is a summary. It may not cover all possible information. If you have questions about this medicine, talk to your doctor, pharmacist, or health care provider.  2014, Elsevier/Gold Standard. (2010-12-19 11:02:44)

## 2014-01-29 LAB — CMP14+EGFR
A/G RATIO: 1.7 (ref 1.1–2.5)
ALT: 26 IU/L (ref 0–32)
AST: 22 IU/L (ref 0–40)
Albumin: 4.2 g/dL (ref 3.5–5.5)
Alkaline Phosphatase: 90 IU/L (ref 39–117)
BUN/Creatinine Ratio: 17 (ref 9–23)
BUN: 13 mg/dL (ref 6–24)
CO2: 23 mmol/L (ref 18–29)
Calcium: 9 mg/dL (ref 8.7–10.2)
Chloride: 99 mmol/L (ref 97–108)
Creatinine, Ser: 0.75 mg/dL (ref 0.57–1.00)
GFR calc Af Amer: 110 mL/min/{1.73_m2} (ref >59)
GFR, EST NON AFRICAN AMERICAN: 95 mL/min/{1.73_m2} (ref >59)
GLUCOSE: 109 mg/dL — AB (ref 65–99)
Globulin, Total: 2.5 g/dL (ref 1.5–4.5)
POTASSIUM: 4.2 mmol/L (ref 3.5–5.2)
Sodium: 139 mmol/L (ref 134–144)
TOTAL PROTEIN: 6.7 g/dL (ref 6.0–8.5)
Total Bilirubin: <0.2 mg/dL (ref 0.0–1.2)

## 2014-01-31 ENCOUNTER — Encounter: Payer: Self-pay | Admitting: *Deleted

## 2014-01-31 ENCOUNTER — Telehealth: Payer: Self-pay | Admitting: Pharmacist

## 2014-01-31 NOTE — Telephone Encounter (Signed)
Patient notified of recent labs results.

## 2014-02-16 ENCOUNTER — Encounter: Payer: Self-pay | Admitting: General Practice

## 2014-02-16 ENCOUNTER — Telehealth: Payer: Self-pay | Admitting: General Practice

## 2014-02-16 ENCOUNTER — Ambulatory Visit (INDEPENDENT_AMBULATORY_CARE_PROVIDER_SITE_OTHER): Payer: BC Managed Care – PPO | Admitting: General Practice

## 2014-02-16 VITALS — BP 130/81 | HR 88 | Temp 98.6°F | Ht 64.0 in | Wt 277.4 lb

## 2014-02-16 DIAGNOSIS — J019 Acute sinusitis, unspecified: Secondary | ICD-10-CM

## 2014-02-16 DIAGNOSIS — R059 Cough, unspecified: Secondary | ICD-10-CM

## 2014-02-16 DIAGNOSIS — R05 Cough: Secondary | ICD-10-CM

## 2014-02-16 MED ORDER — AZITHROMYCIN 250 MG PO TABS: ORAL_TABLET | ORAL | Status: DC

## 2014-02-16 MED ORDER — BENZONATATE 100 MG PO CAPS: 100.0000 mg | ORAL_CAPSULE | Freq: Three times a day (TID) | ORAL | Status: DC | PRN

## 2014-02-16 MED ORDER — METHYLPREDNISOLONE ACETATE 80 MG/ML IJ SUSP
80.0000 mg | Freq: Once | INTRAMUSCULAR | Status: AC
  Administered 2014-02-16: 80 mg via INTRAMUSCULAR

## 2014-02-16 NOTE — Patient Instructions (Signed)

## 2014-02-16 NOTE — Progress Notes (Signed)
   Subjective:    Patient ID: Holly Rodriguez, female    DOB: Apr 04, 1966, 48 y.o.   MRN: [id number]  Sinusitis This is a new problem. The current episode started yesterday. There has been no fever. Associated symptoms include congestion, coughing, sinus pressure and a sore throat. Pertinent negatives include no chills or shortness of breath. Past treatments include oral decongestants.      Review of Systems  Constitutional: Negative for fever and chills.  HENT: Positive for congestion, postnasal drip, sinus pressure and sore throat.   Respiratory: Positive for cough. Negative for chest tightness and shortness of breath.   Cardiovascular: Negative for chest pain and palpitations.       Objective:   Physical Exam  Constitutional: She is oriented to person, place, and time. She appears well-developed and well-nourished.  HENT:  Head: Normocephalic and atraumatic.  Nose: Right sinus exhibits maxillary sinus tenderness and frontal sinus tenderness. Left sinus exhibits maxillary sinus tenderness and frontal sinus tenderness.  Mouth/Throat: Posterior oropharyngeal erythema present.  Cardiovascular: Normal rate, regular rhythm and normal heart sounds.   No murmur heard. Pulmonary/Chest: Effort normal and breath sounds normal. No respiratory distress. She exhibits no tenderness.  Neurological: She is alert and oriented to person, place, and time.  Skin: Skin is warm and dry. No rash noted.  Psychiatric: She has a normal mood and affect.          Assessment & Plan:  1. Sinusitis, acute  - azithromycin (ZITHROMAX) 250 MG tablet; Take as directed  Dispense: 6 tablet; Refill: 0  2. Cough  - methylPREDNISolone acetate (DEPO-MEDROL) injection 80 mg; Inject 1 mL (80 mg total) into the muscle once. -discussed adequate hydration  -RTO  If symptoms worsen or unresolved -Patient verbalized understanding Erby Pian, FNP-C

## 2014-02-17 ENCOUNTER — Telehealth: Payer: Self-pay | Admitting: General Practice

## 2014-02-17 DIAGNOSIS — R059 Cough, unspecified: Secondary | ICD-10-CM

## 2014-02-17 DIAGNOSIS — R05 Cough: Secondary | ICD-10-CM

## 2014-02-17 MED ORDER — BENZONATATE 100 MG PO CAPS: 100.0000 mg | ORAL_CAPSULE | Freq: Three times a day (TID) | ORAL | Status: DC | PRN

## 2014-02-17 NOTE — Telephone Encounter (Signed)
Med was sent electronically to [facility]. Reordered and sent to [facility] in [place]. Called [facility] and cancelled prescription.  Patient aware.

## 2014-02-21 ENCOUNTER — Other Ambulatory Visit: Payer: Self-pay | Admitting: General Practice

## 2014-02-21 ENCOUNTER — Telehealth: Payer: Self-pay | Admitting: General Practice

## 2014-02-21 NOTE — Telephone Encounter (Signed)
Needs to be seen

## 2014-02-22 NOTE — Telephone Encounter (Signed)
Patient unable to come in due to work schedule. Her cough is improving and overall symptoms aren't as bad. Continues to have post nasal drainage, head congestion, and mild cough. Explained that Zpak works for 10 days even though you take it for 5. Suggested she increase fluid intake. Can take plain mucinex. Antihistamine for post nasal drainage if it is more of a problem than the congestion.  She does have seasonal allergies and this could be related.  She will call back if symptoms worsen or fail to improve.

## 2014-05-02 ENCOUNTER — Ambulatory Visit (INDEPENDENT_AMBULATORY_CARE_PROVIDER_SITE_OTHER): Payer: BC Managed Care – PPO

## 2014-05-02 ENCOUNTER — Encounter (INDEPENDENT_AMBULATORY_CARE_PROVIDER_SITE_OTHER): Payer: Self-pay

## 2014-05-02 ENCOUNTER — Ambulatory Visit (INDEPENDENT_AMBULATORY_CARE_PROVIDER_SITE_OTHER): Payer: BC Managed Care – PPO | Admitting: Family Medicine

## 2014-05-02 ENCOUNTER — Encounter: Payer: Self-pay | Admitting: Family Medicine

## 2014-05-02 VITALS — BP 110/67 | HR 80 | Temp 98.1°F | Ht 64.0 in | Wt 278.0 lb

## 2014-05-02 DIAGNOSIS — R252 Cramp and spasm: Secondary | ICD-10-CM

## 2014-05-02 DIAGNOSIS — E1169 Type 2 diabetes mellitus with other specified complication: Secondary | ICD-10-CM

## 2014-05-02 DIAGNOSIS — E119 Type 2 diabetes mellitus without complications: Secondary | ICD-10-CM

## 2014-05-02 DIAGNOSIS — I152 Hypertension secondary to endocrine disorders: Secondary | ICD-10-CM

## 2014-05-02 DIAGNOSIS — E039 Hypothyroidism, unspecified: Secondary | ICD-10-CM

## 2014-05-02 DIAGNOSIS — E559 Vitamin D deficiency, unspecified: Secondary | ICD-10-CM

## 2014-05-02 DIAGNOSIS — I10 Essential (primary) hypertension: Secondary | ICD-10-CM

## 2014-05-02 DIAGNOSIS — E1159 Type 2 diabetes mellitus with other circulatory complications: Secondary | ICD-10-CM

## 2014-05-02 DIAGNOSIS — F411 Generalized anxiety disorder: Secondary | ICD-10-CM

## 2014-05-02 DIAGNOSIS — R609 Edema, unspecified: Secondary | ICD-10-CM

## 2014-05-02 DIAGNOSIS — K219 Gastro-esophageal reflux disease without esophagitis: Secondary | ICD-10-CM

## 2014-05-02 LAB — POCT CBC
Granulocyte percent: 56.4 %G (ref 37–80)
HEMATOCRIT: 36.4 % — AB (ref 37.7–47.9)
Hemoglobin: 11.4 g/dL — AB (ref 12.2–16.2)
LYMPH, POC: 5 — AB (ref 0.6–3.4)
MCH, POC: 27.8 pg (ref 27–31.2)
MCHC: 31.3 g/dL — AB (ref 31.8–35.4)
MCV: 88.8 fL (ref 80–97)
MPV: 7.2 fL (ref 0–99.8)
POC GRANULOCYTE: 7.6 — AB (ref 2–6.9)
POC LYMPH PERCENT: 37.3 %L (ref 10–50)
Platelet Count, POC: 293 10*3/uL (ref 142–424)
RBC: 4.1 M/uL (ref 4.04–5.48)
RDW, POC: 15.9 %
WBC: 13.5 10*3/uL — AB (ref 4.6–10.2)

## 2014-05-02 LAB — POCT GLYCOSYLATED HEMOGLOBIN (HGB A1C): HEMOGLOBIN A1C: 6.1

## 2014-05-02 LAB — POCT UA - MICROALBUMIN: MICROALBUMIN (UR) POC: 20 mg/L

## 2014-05-02 MED ORDER — HYOSCYAMINE SULFATE ER 0.375 MG PO TB12: 0.3750 mg | ORAL_TABLET | Freq: Two times a day (BID) | ORAL | Status: DC

## 2014-05-02 MED ORDER — LISINOPRIL-HYDROCHLOROTHIAZIDE 20-25 MG PO TABS: 1.0000 | ORAL_TABLET | Freq: Every day | ORAL | Status: DC

## 2014-05-02 MED ORDER — METFORMIN HCL 500 MG PO TABS: 500.0000 mg | ORAL_TABLET | Freq: Two times a day (BID) | ORAL | Status: DC

## 2014-05-02 MED ORDER — POTASSIUM CHLORIDE ER 10 MEQ PO TBCR: 10.0000 meq | EXTENDED_RELEASE_TABLET | Freq: Every day | ORAL | Status: DC

## 2014-05-02 MED ORDER — ALPRAZOLAM 0.5 MG PO TABS: 0.5000 mg | ORAL_TABLET | Freq: Every evening | ORAL | Status: DC | PRN

## 2014-05-02 MED ORDER — LEVOTHYROXINE SODIUM 75 MCG PO TABS: 75.0000 ug | ORAL_TABLET | Freq: Every day | ORAL | Status: DC

## 2014-05-02 MED ORDER — OMEPRAZOLE 40 MG PO CPDR: 40.0000 mg | DELAYED_RELEASE_CAPSULE | Freq: Every day | ORAL | Status: DC

## 2014-05-02 NOTE — Patient Instructions (Signed)
Continue to take medication as directed Drink more water Move around during the day instead of sitting in one position for long periods of time We'll call you with the results of her lab work once those results are available  Do not forget to get your pelvic exam and mammogram You should also get eye exams yearly because you are a diabetic Please bring your blood sugar readings to that visit each time you come Keep your feet checked regularly Be careful and not putting herself at risk for falling

## 2014-05-02 NOTE — Progress Notes (Signed)
Subjective:    Patient ID: Holly Rodriguez, female    DOB: November 22, 1965, 48 y.o.   MRN: [id number]  HPI Pt here for follow up and management of chronic medical problems. This patient has a history of diabetes and hypertension. She also complains of leg pain myalgias especially at night. She is due to get her mammogram. She is also due to get a chest x-ray lab work and return an  FOBT. The patient indicates that her home blood sugars have been running between 180 247 range.       Patient Active Problem List   Diagnosis Date Noted  . Type 2 diabetes mellitus not at goal 12/06/2013  . Hypertension associated with diabetes 12/06/2013  . Acute appendicitis 10/25/2013   Outpatient Encounter Prescriptions as of 05/02/2014  Medication Sig  . ALPRAZolam (XANAX) 0.5 MG tablet Take 1 tablet (0.5 mg total) by mouth at bedtime as needed for anxiety.  . B Complex-Biotin-FA (B-COMPLEX PO) Take 1 tablet by mouth daily.  . cholecalciferol (VITAMIN D) 1000 UNITS tablet Take 1,000 Units by mouth daily.  Marland Kitchen glucose blood test strip Reli-on glucometer.  Test Blood sugar qid Dx. 250.01  . hyoscyamine (LEVBID) 0.375 MG 12 hr tablet Take 1 tablet (0.375 mg total) by mouth 2 (two) times daily.  . Lancets (ONETOUCH ULTRASOFT) lancets Test 1X per day and prn   Dx 250.01  . levothyroxine (SYNTHROID) 75 MCG tablet Take 1 tablet (75 mcg total) by mouth daily before breakfast.  . lisinopril-hydrochlorothiazide (PRINZIDE,ZESTORETIC) 20-25 MG per tablet Take 1 tablet by mouth daily.  . metFORMIN (GLUCOPHAGE) 500 MG tablet Take 500 mg by mouth 2 (two) times daily before a meal.  . Multiple Vitamins-Minerals (ALIVE WOMENS ENERGY PO) Take 1 tablet by mouth daily.  . Omega-3 Fatty Acids (OMEGA 3 PO) Take 1 capsule by mouth daily.  Marland Kitchen omeprazole (PRILOSEC) 40 MG capsule Take 1 capsule (40 mg total) by mouth daily.  . potassium chloride (KLOR-CON 10) 10 MEQ tablet Take 1 tablet (10 mEq total) by mouth daily.  . [DISCONTINUED]  azithromycin (ZITHROMAX) 250 MG tablet Take as directed  . [DISCONTINUED] benzonatate (TESSALON) 100 MG capsule Take 1 capsule (100 mg total) by mouth 3 (three) times daily as needed.    Review of Systems  Constitutional: Negative.   HENT: Negative.   Eyes: Negative.   Respiratory: Negative.   Cardiovascular: Negative.   Gastrointestinal: Negative.   Endocrine: Negative.   Genitourinary: Negative.   Musculoskeletal: Positive for myalgias (leg pains and cramps- worse at night).  Skin: Negative.   Allergic/Immunologic: Negative.   Neurological: Negative.   Hematological: Negative.   Psychiatric/Behavioral: Negative.        Objective:   Physical Exam  Nursing note and vitals reviewed. Constitutional: She is oriented to person, place, and time. She appears well-developed and well-nourished. No distress.   Pleasant but overweight  HENT:  Head: Normocephalic and atraumatic.  Right Ear: External ear normal.  Left Ear: External ear normal.  Mouth/Throat: Oropharynx is clear and moist. No oropharyngeal exudate.  Some nasal congestion bilaterally  Eyes: Conjunctivae and EOM are normal. Pupils are equal, round, and reactive to light. Right eye exhibits no discharge. Left eye exhibits no discharge. No scleral icterus.  Neck: Normal range of motion. Neck supple. No thyromegaly present.  Cardiovascular: Normal rate, regular rhythm, normal heart sounds and intact distal pulses.  Exam reveals no gallop and no friction rub.   No murmur heard. At 84 per minute  Pulmonary/Chest:  Effort normal and breath sounds normal. No respiratory distress. She has no wheezes. She has no rales. She exhibits no tenderness.  Abdominal: Soft. Bowel sounds are normal. She exhibits no mass. There is no tenderness. There is no rebound and no guarding.  Obesity, midline scar from previous gastric stapling  Musculoskeletal: Normal range of motion. She exhibits no edema and no tenderness.  Lymphadenopathy:    She  has no cervical adenopathy.  Neurological: She is alert and oriented to person, place, and time. She has normal reflexes. No cranial nerve deficit.  Skin: Skin is warm and dry. No rash noted.  Psychiatric: She has a normal mood and affect. Her behavior is normal. Judgment and thought content normal.   BP 110/67  Pulse 80  Temp(Src) 98.1 F (36.7 C) (Oral)  Ht _0  (1.626 m)  Wt 278 lb (126.1 kg)  BMI 47.70 kg/m2  LMP 04/11/2014  [facility] reading (PRIMARY) by  Dr.Moore-chest x-rayx                                       Assessment & Plan:  1. Type 2 diabetes mellitus not at goal - POCT glycosylated hemoglobin (Hb A1C) - POCT CBC - Microalbumin, urine - POCT UA - Microalbumin  2. Hypertension associated with diabetes - POCT CBC - BMP8+EGFR - Hepatic function panel - NMR, lipoprofile - DG Chest 2 View; Future  3. Hypothyroidism - POCT CBC - Thyroid Panel With TSH  4. Vitamin D deficiency - POCT CBC - Vit D  25 hydroxy (rtn osteoporosis monitoring)  5. Leg cramps - Vitamin B12  6. Anxiety state, unspecified - hyoscyamine (LEVBID) 0.375 MG 12 hr tablet; Take 1 tablet (0.375 mg total) by mouth 2 (two) times daily.  Dispense: 180 tablet; Refill: 3 - ALPRAZolam (XANAX) 0.5 MG tablet; Take 1 tablet (0.5 mg total) by mouth at bedtime as needed for anxiety.  Dispense: 60 tablet; Refill: 3  7. Edema - potassium chloride (KLOR-CON 10) 10 MEQ tablet; Take 1 tablet (10 mEq total) by mouth daily.  Dispense: 90 tablet; Refill: 3  8. GERD (gastroesophageal reflux disease) - omeprazole (PRILOSEC) 40 MG capsule; Take 1 capsule (40 mg total) by mouth daily.  Dispense: 90 capsule; Refill: 3  9. Unspecified hypothyroidism - levothyroxine (SYNTHROID) 75 MCG tablet; Take 1 tablet (75 mcg total) by mouth daily before breakfast.  Dispense: 90 tablet; Refill: 3  Meds ordered this encounter  Medications  . hyoscyamine (LEVBID) 0.375 MG 12 hr tablet    Sig: Take 1 tablet (0.375 mg total)  by mouth 2 (two) times daily.    Dispense:  180 tablet    Refill:  3    May substitute    Order Specific Question:  Supervising Provider    Answer:  Chipper Herb [[id number]]  . ALPRAZolam (XANAX) 0.5 MG tablet    Sig: Take 1 tablet (0.5 mg total) by mouth at bedtime as needed for anxiety.    Dispense:  60 tablet    Refill:  3    Order Specific Question:  Supervising Provider    Answer:  Chipper Herb [[id number]]  . potassium chloride (KLOR-CON 10) 10 MEQ tablet    Sig: Take 1 tablet (10 mEq total) by mouth daily.    Dispense:  90 tablet    Refill:  3    Order Specific Question:  Supervising Provider    Answer:  Redge Gainer W [[id number]]  . omeprazole (PRILOSEC) 40 MG capsule    Sig: Take 1 capsule (40 mg total) by mouth daily.    Dispense:  90 capsule    Refill:  3    Order Specific Question:  Supervising Provider    Answer:  Chipper Herb [[id number]]  . levothyroxine (SYNTHROID) 75 MCG tablet    Sig: Take 1 tablet (75 mcg total) by mouth daily before breakfast.    Dispense:  90 tablet    Refill:  3    Order Specific Question:  Supervising Provider    Answer:  Chipper Herb [[id number]]  . lisinopril-hydrochlorothiazide (PRINZIDE,ZESTORETIC) 20-25 MG per tablet    Sig: Take 1 tablet by mouth daily.    Dispense:  90 tablet    Refill:  2    We are discontinuing furosemide.  . metFORMIN (GLUCOPHAGE) 500 MG tablet    Sig: Take 1 tablet (500 mg total) by mouth 2 (two) times daily with a meal.    Dispense:  180 tablet    Refill:  3   Patient Instructions  Continue to take medication as directed Drink more water Move around during the day instead of sitting in one position for long periods of time We'll call you with the results of her lab work once those results are available  Do not forget to get your pelvic exam and mammogram You should also get eye exams yearly because you are a diabetic Please bring your blood sugar readings to that visit each time you come Keep your feet checked  regularly Be careful and not putting herself at risk for falling    return FOBT Arrie Senate MD

## 2014-05-03 ENCOUNTER — Other Ambulatory Visit: Payer: Self-pay | Admitting: *Deleted

## 2014-05-03 LAB — THYROID PANEL WITH TSH
Free Thyroxine Index: 2.6 (ref 1.2–4.9)
T3 Uptake Ratio: 30 % (ref 24–39)
T4 TOTAL: 8.6 ug/dL (ref 4.5–12.0)
TSH: 2.3 u[IU]/mL (ref 0.450–4.500)

## 2014-05-03 LAB — NMR, LIPOPROFILE
CHOLESTEROL: 201 mg/dL — AB (ref 100–199)
HDL Cholesterol by NMR: 37 mg/dL — ABNORMAL LOW (ref >39)
HDL Particle Number: 30.6 umol/L (ref >=30.5)
LDL PARTICLE NUMBER: 1585 nmol/L — AB (ref <1000)
LDL Size: 20.2 nm (ref >20.5)
LDLC SERPL CALC-MCNC: 107 mg/dL — ABNORMAL HIGH (ref 0–99)
LP-IR Score: 92 — ABNORMAL HIGH (ref <=45)
Small LDL Particle Number: 1034 nmol/L — ABNORMAL HIGH (ref <=527)
TRIGLYCERIDES BY NMR: 286 mg/dL — AB (ref 0–149)

## 2014-05-03 LAB — MICROALBUMIN, URINE: Microalbumin, Urine: 6.4 ug/mL (ref 0.0–17.0)

## 2014-05-03 LAB — BMP8+EGFR
BUN / CREAT RATIO: 20 (ref 9–23)
BUN: 16 mg/dL (ref 6–24)
CHLORIDE: 98 mmol/L (ref 97–108)
CO2: 26 mmol/L (ref 18–29)
Calcium: 9.2 mg/dL (ref 8.7–10.2)
Creatinine, Ser: 0.81 mg/dL (ref 0.57–1.00)
GFR calc non Af Amer: 86 mL/min/{1.73_m2} (ref >59)
GFR, EST AFRICAN AMERICAN: 99 mL/min/{1.73_m2} (ref >59)
Glucose: 97 mg/dL (ref 65–99)
Potassium: 4.7 mmol/L (ref 3.5–5.2)
Sodium: 136 mmol/L (ref 134–144)

## 2014-05-03 LAB — HEPATIC FUNCTION PANEL
ALK PHOS: 81 IU/L (ref 39–117)
ALT: 16 IU/L (ref 0–32)
AST: 15 IU/L (ref 0–40)
Albumin: 4.3 g/dL (ref 3.5–5.5)
Bilirubin, Direct: 0.07 mg/dL (ref 0.00–0.40)
Total Bilirubin: <0.2 mg/dL (ref 0.0–1.2)
Total Protein: 6.8 g/dL (ref 6.0–8.5)

## 2014-05-03 LAB — VITAMIN D 25 HYDROXY (VIT D DEFICIENCY, FRACTURES): Vit D, 25-Hydroxy: 19.9 ng/mL — ABNORMAL LOW (ref 30.0–100.0)

## 2014-05-03 LAB — VITAMIN B12: VITAMIN B 12: 588 pg/mL (ref 211–946)

## 2014-05-03 MED ORDER — VITAMIN D (ERGOCALCIFEROL) 1.25 MG (50000 UNIT) PO CAPS: 50000.0000 [IU] | ORAL_CAPSULE | ORAL | Status: DC

## 2014-05-09 ENCOUNTER — Other Ambulatory Visit (INDEPENDENT_AMBULATORY_CARE_PROVIDER_SITE_OTHER): Payer: BC Managed Care – PPO

## 2014-05-09 DIAGNOSIS — D72829 Elevated white blood cell count, unspecified: Secondary | ICD-10-CM

## 2014-05-09 LAB — POCT CBC
GRANULOCYTE PERCENT: 59.8 % (ref 37–80)
HEMATOCRIT: 36.5 % — AB (ref 37.7–47.9)
HEMOGLOBIN: 11.8 g/dL — AB (ref 12.2–16.2)
Lymph, poc: 5.2 — AB (ref 0.6–3.4)
MCH, POC: 28.4 pg (ref 27–31.2)
MCHC: 32.2 g/dL (ref 31.8–35.4)
MCV: 88 fL (ref 80–97)
MPV: 7.9 fL (ref 0–99.8)
POC Granulocyte: 8.9 — AB (ref 2–6.9)
POC LYMPH PERCENT: 34.7 %L (ref 10–50)
Platelet Count, POC: 316 10*3/uL (ref 142–424)
RBC: 4.1 M/uL (ref 4.04–5.48)
RDW, POC: 15.1 %
WBC: 14.9 10*3/uL — AB (ref 4.6–10.2)

## 2014-05-09 NOTE — Progress Notes (Signed)
Pt came in for labs only 

## 2014-05-10 ENCOUNTER — Telehealth: Payer: Self-pay | Admitting: Family Medicine

## 2014-05-10 ENCOUNTER — Ambulatory Visit: Payer: BC Managed Care – PPO | Admitting: Family Medicine

## 2014-05-10 NOTE — Telephone Encounter (Signed)
Message copied by Waverly Ferrari on Tue May 10, 2014  4:39 PM ------      Message from: Chevis Pretty      Created: Mon May 09, 2014  4:46 PM       Cbc unchanged from previous except for WBC are higher- recheck next week      Hgb is still low- take iron daily ------

## 2014-05-11 ENCOUNTER — Other Ambulatory Visit: Payer: Self-pay

## 2014-05-11 DIAGNOSIS — Z1231 Encounter for screening mammogram for malignant neoplasm of breast: Secondary | ICD-10-CM

## 2014-05-12 ENCOUNTER — Telehealth: Payer: Self-pay | Admitting: Family Medicine

## 2014-05-12 NOTE — Telephone Encounter (Signed)
Spoke with patient and she was unsure why [facility] was sending in a rx for her lisionopril/hctz because moore had printed them out for her. I explained to patient that the lisinopril went electronically and had her look at her rx to make sure that it did not print out also and it didn't. Patient understands

## 2014-05-16 ENCOUNTER — Other Ambulatory Visit (INDEPENDENT_AMBULATORY_CARE_PROVIDER_SITE_OTHER): Payer: BC Managed Care – PPO

## 2014-05-16 DIAGNOSIS — D72829 Elevated white blood cell count, unspecified: Secondary | ICD-10-CM

## 2014-05-16 LAB — POCT CBC
GRANULOCYTE PERCENT: 63.3 % (ref 37–80)
HEMATOCRIT: 34.4 % — AB (ref 37.7–47.9)
Hemoglobin: 11.4 g/dL — AB (ref 12.2–16.2)
Lymph, poc: 4.3 — AB (ref 0.6–3.4)
MCH, POC: 29.5 pg (ref 27–31.2)
MCHC: 33.2 g/dL (ref 31.8–35.4)
MCV: 88.9 fL (ref 80–97)
MPV: 7.2 fL (ref 0–99.8)
PLATELET COUNT, POC: 295 10*3/uL (ref 142–424)
POC Granulocyte: 8.4 — AB (ref 2–6.9)
POC LYMPH PERCENT: 32.3 %L (ref 10–50)
RBC: 3.9 M/uL — AB (ref 4.04–5.48)
RDW, POC: 14.8 %
WBC: 13.2 10*3/uL — AB (ref 4.6–10.2)

## 2014-05-16 NOTE — Progress Notes (Signed)
Pt came in for labs only 

## 2014-05-17 ENCOUNTER — Other Ambulatory Visit: Payer: Self-pay

## 2014-05-17 ENCOUNTER — Ambulatory Visit
Admission: RE | Admit: 2014-05-17 | Discharge: 2014-05-17 | Disposition: A | Discharge status: Home / Self Care | Payer: BC Managed Care – PPO | Source: Ambulatory Visit

## 2014-05-17 DIAGNOSIS — D72829 Elevated white blood cell count, unspecified: Secondary | ICD-10-CM

## 2014-05-17 DIAGNOSIS — Z1231 Encounter for screening mammogram for malignant neoplasm of breast: Secondary | ICD-10-CM

## 2014-05-18 ENCOUNTER — Other Ambulatory Visit: Payer: Self-pay | Admitting: Obstetrics and Gynecology

## 2014-05-19 ENCOUNTER — Other Ambulatory Visit: Payer: Self-pay | Admitting: Obstetrics and Gynecology

## 2014-05-19 DIAGNOSIS — R928 Other abnormal and inconclusive findings on diagnostic imaging of breast: Secondary | ICD-10-CM

## 2014-05-31 ENCOUNTER — Ambulatory Visit
Admission: RE | Admit: 2014-05-31 | Discharge: 2014-05-31 | Disposition: A | Discharge status: Home / Self Care | Payer: BC Managed Care – PPO | Source: Ambulatory Visit | Attending: Obstetrics and Gynecology | Admitting: Obstetrics and Gynecology

## 2014-05-31 DIAGNOSIS — R928 Other abnormal and inconclusive findings on diagnostic imaging of breast: Secondary | ICD-10-CM

## 2014-06-06 ENCOUNTER — Encounter (HOSPITAL_COMMUNITY): Payer: Self-pay

## 2014-06-06 ENCOUNTER — Encounter (HOSPITAL_COMMUNITY): Payer: BC Managed Care – PPO | Attending: Hematology

## 2014-06-06 VITALS — BP 108/59 | HR 68 | Temp 98.1°F | Resp 16 | Ht 63.5 in | Wt 274.4 lb

## 2014-06-06 DIAGNOSIS — K589 Irritable bowel syndrome without diarrhea: Secondary | ICD-10-CM | POA: Insufficient documentation

## 2014-06-06 DIAGNOSIS — E079 Disorder of thyroid, unspecified: Secondary | ICD-10-CM | POA: Insufficient documentation

## 2014-06-06 DIAGNOSIS — D649 Anemia, unspecified: Secondary | ICD-10-CM

## 2014-06-06 DIAGNOSIS — Z79899 Other long term (current) drug therapy: Secondary | ICD-10-CM | POA: Insufficient documentation

## 2014-06-06 DIAGNOSIS — I10 Essential (primary) hypertension: Secondary | ICD-10-CM

## 2014-06-06 DIAGNOSIS — R790 Abnormal level of blood mineral: Secondary | ICD-10-CM

## 2014-06-06 DIAGNOSIS — M549 Dorsalgia, unspecified: Secondary | ICD-10-CM | POA: Insufficient documentation

## 2014-06-06 DIAGNOSIS — M545 Low back pain, unspecified: Secondary | ICD-10-CM

## 2014-06-06 DIAGNOSIS — K219 Gastro-esophageal reflux disease without esophagitis: Secondary | ICD-10-CM | POA: Insufficient documentation

## 2014-06-06 DIAGNOSIS — D72829 Elevated white blood cell count, unspecified: Secondary | ICD-10-CM | POA: Insufficient documentation

## 2014-06-06 DIAGNOSIS — E119 Type 2 diabetes mellitus without complications: Secondary | ICD-10-CM

## 2014-06-06 DIAGNOSIS — F411 Generalized anxiety disorder: Secondary | ICD-10-CM | POA: Insufficient documentation

## 2014-06-06 DIAGNOSIS — K625 Hemorrhage of anus and rectum: Secondary | ICD-10-CM | POA: Insufficient documentation

## 2014-06-06 LAB — CBC WITH DIFFERENTIAL/PLATELET
Basophils Absolute: 0.1 10*3/uL (ref 0.0–0.1)
Basophils Relative: 1 % (ref 0–1)
EOS ABS: 0.3 10*3/uL (ref 0.0–0.7)
EOS PCT: 3 % (ref 0–5)
HCT: 34.7 % — ABNORMAL LOW (ref 36.0–46.0)
Hemoglobin: 11.6 g/dL — ABNORMAL LOW (ref 12.0–15.0)
Lymphocytes Relative: 37 % (ref 12–46)
Lymphs Abs: 3.8 10*3/uL (ref 0.7–4.0)
MCH: 29.9 pg (ref 26.0–34.0)
MCHC: 33.4 g/dL (ref 30.0–36.0)
MCV: 89.4 fL (ref 78.0–100.0)
Monocytes Absolute: 0.6 10*3/uL (ref 0.1–1.0)
Monocytes Relative: 6 % (ref 3–12)
Neutro Abs: 5.5 10*3/uL (ref 1.7–7.7)
Neutrophils Relative %: 53 % (ref 43–77)
PLATELETS: 305 10*3/uL (ref 150–400)
RBC: 3.88 MIL/uL (ref 3.87–5.11)
RDW: 14.2 % (ref 11.5–15.5)
WBC: 10.3 10*3/uL (ref 4.0–10.5)

## 2014-06-06 LAB — BASIC METABOLIC PANEL
Anion gap: 14 (ref 5–15)
BUN: 15 mg/dL (ref 6–23)
CALCIUM: 9.1 mg/dL (ref 8.4–10.5)
CO2: 22 mEq/L (ref 19–32)
Chloride: 101 mEq/L (ref 96–112)
Creatinine, Ser: 0.8 mg/dL (ref 0.50–1.10)
GFR calc Af Amer: >90 mL/min (ref >90)
GFR, EST NON AFRICAN AMERICAN: 86 mL/min — AB (ref >90)
GLUCOSE: 90 mg/dL (ref 70–99)
Potassium: 4.3 mEq/L (ref 3.7–5.3)
Sodium: 137 mEq/L (ref 137–147)

## 2014-06-06 NOTE — Progress Notes (Signed)
[facility] OFFICE PROGRESS NOTE  PCP Redge Gainer, [place] [address]  DIAGNOSIS: Low back pain without sciatica, unspecified back pain laterality - Plan: NM Bone Scan Whole Body, CBC with Differential, Basic metabolic panel, Ferritin, Iron and TIBC, CBC with Differential, Basic metabolic panel, Ferritin, Iron and TIBC  Leukocytosis, unspecified - Plan: NM Bone Scan Whole Body, CBC with Differential, Basic metabolic panel, Ferritin, Iron and TIBC, CBC with Differential, Basic metabolic panel, Ferritin, Iron and TIBC  Low ferritin - Plan: NM Bone Scan Whole Body, CBC with Differential, Basic metabolic panel, Ferritin, Iron and TIBC, CBC with Differential, Basic metabolic panel, Ferritin, Iron and TIBC  HEMATOLOGY/ONCOLOGY HX: Ms. Dema Severin has a history of IBS and experiences diarrhea. She has noticed small amounts of hematochezia for several months. Recently it increased and she contacted her PCP.  She is scheduled for a colonoscopy this week. Multiple CBCs have been obtained with the Hgb. ranging from 11.4-11.8. The indices have been normochromic and normocytic. There has also been a mild leukocytosis with a normal differential. She denies fevers, chills, night sweats, or signs of infections. There is no prior history of an autoimmune or myeloproferative disorder. CT scan of the abdomen showed an unremarkable appearing spleen. No occupational exposure, rash, jaundice, or arthralgias. No use of steroids. She has ice craving.  Ms. Dema Severin has had the abrupt onset of right sided low back pain that is non radiating and not associated with a fall or other trauma. It does increase with lifting and weight bearing. Recently she had an unremarkable pelvic exam by her gynecologist. A PAP smear was performed. Thus far no abnormal results reported. Mammogram and right breast ultrasound showed benign findings.            MEDICAL HISTORY:  Past Medical  History  Diagnosis Date  . GERD (gastroesophageal reflux disease)   . Thyroid disease   . Anxiety   . IBS (irritable bowel syndrome)   . Sleep apnea     has Acute appendicitis; Type 2 diabetes mellitus not at goal; and Hypertension associated with diabetes on her problem list.    ALLERGIES:  has No Known Allergies.  MEDICATIONS: has a current medication list which includes the following prescription(s): glucose blood, hyoscyamine, onetouch ultrasoft, levothyroxine, lisinopril-hydrochlorothiazide, metformin, omega-3 fatty acids, omeprazole, potassium chloride, vitamin d (ergocalciferol), alprazolam, b complex-biotin-fa, and multiple vitamins-minerals.  FAMILY HISTORY: family history includes Alzheimer's disease in her maternal grandfather; Arthritis in her maternal grandmother and mother; Fibromyalgia in her mother; Heart disease in her maternal grandmother; Hyperlipidemia in her mother.  REVIEW OF SYSTEMS:    SINCE YOUR LAST VISIT Been diagnosed or treated for a new medical /surgical  problem or condition: Yes Any Recent Xrays or studies performed: Yes Any new prescription or OTC medications: No ECOG Perf Status: Restricted in physically strenuous activity but ambulatory and able to carry out work of a light or sedentary nature, e.g., light house work, office work (some decreased energy) Problems sleeping: No (has sleep apnea - uses CPAP) Medications taken to help sleep: No How is your appetie: 100% normal Any Supplements: No Any trouble chewing or swallowing: No Any Nausea or Vomiting: Yes (intermittent nausea) Any Bowel problems: Yes (IBS) # Bowel Movements per week: 10+ Any Urinary Issues: No Any Cardiac Problems: No Any Respiratory Issues: No Any Neurological Issues: Yes (back discomfort) Do you live alone: No Feelings hopelessness: No You or your family have any concerns or  Health changes: Yes (Significance of elevated WBC and decreased HGB-) Pain Assessment Pain Score: 8   Pain Location: Back Pain Orientation: Right;Lower Pain Descriptors / Indicators: Radiating;Aching Pain Frequency: Intermittent Pain Onset: On-going Pain Relieving Factors:  (rest) Pain Aggravating Factors:  (movement) Pain Intervention(s): TENS;Rest Significant Pain in Recent Past: No Effect of Pain on Daily Activities:  (hard to do some things.)  Other than that discussed above is noncontributory.    PHYSICAL EXAMINATION:   height is 5' 3.5" (1.613 m) and weight is 274 lb 6.4 oz (124.467 kg). Her oral temperature is 98.1 F (36.7 C). Her blood pressure is 108/59 and her pulse is 68. Her respiration is 16 and oxygen saturation is 98%.    GENERAL: alert, no distress and comfortable while sitting. SKIN: skin color, texture, turgor are normal, no rashes or significant lesions. No cyanosis of digits and no clubbing.  EYES: PERLA; Conjunctiva are pink and non-injected, sclera clear OROPHARYNX: no exudate, no erythema on lips, buccal mucosa, or tongue. NECK: supple, thyroid normal size, non-tender, without nodularity. No masses LYMPH:  no palpable lymphadenopathy in the cervical, axillary or inguinal LUNGS: clear to auscultation and percussion with normal breathing effort,  HEART: regular rate & rhythm and no murmurs.  BREASTS: symmetric, no palpable abnormalities or skin changes    ABDOMEN: abdomen soft, obese, non-tender and normal bowel sounds present. No masses palpated. DRE: not performed.  MUSCULOSKELETAL/EXTREMITIES: Range of motion lower extremities normal. No edema.  NEURO: alert & oriented x 3 with fluent speech, no focal motor/sensory deficits. Reflexes diffusely decreased.   LABORATORY DATA: Office Visit on 06/06/2014  Component Date Value Ref Range Status  . WBC 06/06/2014 10.3  4.0 - 10.5 K/uL Final  . RBC 06/06/2014 3.88  3.87 - 5.11 MIL/uL Final  . Hemoglobin 06/06/2014 11.6* 12.0 - 15.0 g/dL Final  . HCT 06/06/2014 34.7* 36.0 - 46.0 % Final  . MCV 06/06/2014 89.4   78.0 - 100.0 fL Final  . MCH 06/06/2014 29.9  26.0 - 34.0 pg Final  . MCHC 06/06/2014 33.4  30.0 - 36.0 g/dL Final  . RDW 06/06/2014 14.2  11.5 - 15.5 % Final  . Platelets 06/06/2014 305  150 - 400 K/uL Final  . Neutrophils Relative % 06/06/2014 53  43 - 77 % Final  . Neutro Abs 06/06/2014 5.5  1.7 - 7.7 K/uL Final  . Lymphocytes Relative 06/06/2014 37  12 - 46 % Final  . Lymphs Abs 06/06/2014 3.8  0.7 - 4.0 K/uL Final  . Monocytes Relative 06/06/2014 6  3 - 12 % Final  . Monocytes Absolute 06/06/2014 0.6  0.1 - 1.0 K/uL Final  . Eosinophils Relative 06/06/2014 3  0 - 5 % Final  . Eosinophils Absolute 06/06/2014 0.3  0.0 - 0.7 K/uL Final  . Basophils Relative 06/06/2014 1  0 - 1 % Final  . Basophils Absolute 06/06/2014 0.1  0.0 - 0.1 K/uL Final  . Sodium 06/06/2014 137  137 - 147 mEq/L Final  . Potassium 06/06/2014 4.3  3.7 - 5.3 mEq/L Final  . Chloride 06/06/2014 101  96 - 112 mEq/L Final  . CO2 06/06/2014 22  19 - 32 mEq/L Final  . Glucose, Bld 06/06/2014 90  70 - 99 mg/dL Final  . BUN 06/06/2014 15  6 - 23 mg/dL Final  . Creatinine, Ser 06/06/2014 0.80  0.50 - 1.10 mg/dL Final  . Calcium 06/06/2014 9.1  8.4 - 10.5 mg/dL Final  . GFR calc non Af Wyvonnia Lora 06/06/2014  86* >90 mL/min Final  . GFR calc Af Amer 06/06/2014 >90  >90 mL/min Final   Comment: (NOTE)                          The eGFR has been calculated using the CKD EPI equation.                          This calculation has not been validated in all clinical situations.                          eGFR's persistently <90 mL/min signify possible Chronic Kidney                          Disease.  . Anion gap 06/06/2014 14  5 - 15 Final  Lab on 05/16/2014  Component Date Value Ref Range Status  . WBC 05/16/2014 13.2* 4.6 - 10.2 K/uL Final  . Lymph, poc 05/16/2014 4.3* 0.6 - 3.4 Final  . POC LYMPH PERCENT 05/16/2014 32.3  10 - 50 %L Final  . POC Granulocyte 05/16/2014 8.4* 2 - 6.9 Final  . Granulocyte percent 05/16/2014 63.3  37 -  80 %G Final  . RBC 05/16/2014 3.9* 4.04 - 5.48 M/uL Final  . Hemoglobin 05/16/2014 11.4* 12.2 - 16.2 g/dL Final  . HCT, POC 05/16/2014 34.4* 37.7 - 47.9 % Final  . MCV 05/16/2014 88.9  80 - 97 fL Final  . MCH, POC 05/16/2014 29.5  27 - 31.2 pg Final  . MCHC 05/16/2014 33.2  31.8 - 35.4 g/dL Final  . RDW, POC 05/16/2014 14.8   Final  . Platelet Count, POC 05/16/2014 295.0  142 - 424 K/uL Final  . MPV 05/16/2014 7.2  0 - 99.8 fL Final  Lab on 05/09/2014  Component Date Value Ref Range Status  . WBC 05/09/2014 14.9* 4.6 - 10.2 K/uL Final  . Lymph, poc 05/09/2014 5.2* 0.6 - 3.4 Final  . POC LYMPH PERCENT 05/09/2014 34.7  10 - 50 %L Final  . POC Granulocyte 05/09/2014 8.9* 2 - 6.9 Final  . Granulocyte percent 05/09/2014 59.8  37 - 80 %G Final  . RBC 05/09/2014 4.1  4.04 - 5.48 M/uL Final  . Hemoglobin 05/09/2014 11.8* 12.2 - 16.2 g/dL Final  . HCT, POC 05/09/2014 36.5* 37.7 - 47.9 % Final  . MCV 05/09/2014 88.0  80 - 97 fL Final  . MCH, POC 05/09/2014 28.4  27 - 31.2 pg Final  . MCHC 05/09/2014 32.2  31.8 - 35.4 g/dL Final  . RDW, POC 05/09/2014 15.1   Final  . Platelet Count, POC 05/09/2014 316.0  142 - 424 K/uL Final  . MPV 05/09/2014 7.9  0 - 99.8 fL Final    PATHOLOGY: No results to report  RADIOGRAPHIC STUDIES: Mm Digital Diagnostic Unilat R  05/31/2014   CLINICAL DATA:  Possible right breast mass at recent screening mammography.  EXAM: DIGITAL DIAGNOSTIC  RIGHT MAMMOGRAM  ULTRASOUND RIGHT BREAST  COMPARISON:  Previous examinations, including the screening mammogram dated 05/17/2014.  ACR Breast Density Category b: There are scattered areas of fibroglandular density.  FINDINGS: Spot compression views of the right breast confirm a small, rounded, circumscribed nodule in the outer portion of the breast.  On physical exam, no mass is palpable in the outer right breast and there are no raised skin lesions.  Ultrasound  is performed, showing a normal appearing intramammary lymph node in the  9 o'clock position of the right breast, 6 cm from the nipple, measuring 1.0 cm in maximum diameter. This corresponds to the mammographic mass.  IMPRESSION: Benign right breast intramammary lymph node. No evidence of malignancy.  RECOMMENDATION: Bilateral screening mammogram in 1 year.  I have discussed the findings and recommendations with the patient. Results were also provided in writing at the conclusion of the visit. If applicable, a reminder letter will be sent to the patient regarding the next appointment.  BI-RADS CATEGORY  2: Benign.   Electronically Signed   By: Enrique Sack M.D.   On: 05/31/2014 15:56   Mm Digital Screening Bilateral  05/18/2014   CLINICAL DATA:  Screening.  EXAM: DIGITAL SCREENING BILATERAL MAMMOGRAM WITH CAD  COMPARISON:  Previous exam(s).  ACR Breast Density Category b: There are scattered areas of fibroglandular density.  FINDINGS: In the right breast, a possible mass warrants further evaluation with spot compression views and possibly ultrasound. In the left breast, no findings suspicious for malignancy. Images were processed with CAD.  IMPRESSION: Further evaluation is suggested for possible mass in the right breast.  RECOMMENDATION: Diagnostic mammogram and possibly ultrasound of the right breast. (Code:[id number])  The patient will be contacted regarding the findings, and additional imaging will be scheduled.  BI-RADS CATEGORY  0: Incomplete. Need additional imaging evaluation and/or prior mammograms for comparison.   Electronically Signed   By: Evangeline Dakin M.D.   On: 05/18/2014 08:52   US Breast Ltd Uni Right Inc Axilla  05/31/2014   CLINICAL DATA:  Possible right breast mass at recent screening mammography.  EXAM: DIGITAL DIAGNOSTIC  RIGHT MAMMOGRAM  ULTRASOUND RIGHT BREAST  COMPARISON:  Previous examinations, including the screening mammogram dated 05/17/2014.  ACR Breast Density Category b: There are scattered areas of fibroglandular density.  FINDINGS: Spot compression  views of the right breast confirm a small, rounded, circumscribed nodule in the outer portion of the breast.  On physical exam, no mass is palpable in the outer right breast and there are no raised skin lesions.  Ultrasound is performed, showing a normal appearing intramammary lymph node in the 9 o'clock position of the right breast, 6 cm from the nipple, measuring 1.0 cm in maximum diameter. This corresponds to the mammographic mass.  IMPRESSION: Benign right breast intramammary lymph node. No evidence of malignancy.  RECOMMENDATION: Bilateral screening mammogram in 1 year.  I have discussed the findings and recommendations with the patient. Results were also provided in writing at the conclusion of the visit. If applicable, a reminder letter will be sent to the patient regarding the next appointment.  BI-RADS CATEGORY  2: Benign.   Electronically Signed   By: Enrique Sack M.D.   On: 05/31/2014 15:56    ASSESSMENT: Ms. Dema Severin is a pleasant premenopausal woman being evaluated for a mild anemia and mild leukocytosis. Her symptoms are consistent with iron deficiency in light of a history of intermittent rectal bleeding. Hopefully a benign source will be identified with colonoscopy. Today's CBC shows a normal white blood count.  In my opinion, the leukocytosis is probably a reactive process, possibly due to bleeding.                     RECOMMENDATIONS: Checking a ferritin level and iron profile. In light of the patient's back pain, I will schedule a nuclear medicine bone scan. If the scan is negative and Ms. Whites's back pain  persists, further evaluation with an MRI is reasonable. For completeness, the patient is scheduled for followup in 2 weeks.      Thank you for referring this patient to the cancer center.     Darrall Dears, MD 06/06/2014 7:00 PM  .

## 2014-06-06 NOTE — Patient Instructions (Signed)
[facility] Discharge Instructions  RECOMMENDATIONS MADE BY THE CONSULTANT AND ANY TEST RESULTS WILL BE SENT TO YOUR REFERRING PHYSICIAN.  EXAM FINDINGS BY THE PHYSICIAN TODAY AND SIGNS OR SYMPTOMS TO REPORT TO CLINIC OR PRIMARY PHYSICIAN: Exam and findings as discussed by Dr. Bubba Hales.  Will check some labs today and will get you scheduled for a bone scan and see you back in a couple of weeks to discuss results.  MEDICATIONS PRESCRIBED:  none  INSTRUCTIONS/FOLLOW-UP: Bone Scan Follow-up in 2 - 3 weeks.  Thank you for choosing [facility] to provide your oncology and hematology care.  To afford each patient quality time with our providers, please arrive at least 15 minutes before your scheduled appointment time.  With your help, our goal is to use those 15 minutes to complete the necessary work-up to ensure our physicians have the information they need to help with your evaluation and healthcare recommendations.    Effective January 1st, 2014, we ask that you re-schedule your appointment with our physicians should you arrive 10 or more minutes late for your appointment.  We strive to give you quality time with our providers, and arriving late affects you and other patients whose appointments are after yours.    Again, thank you for choosing [facility].  Our hope is that these requests will decrease the amount of time that you wait before being seen by our physicians.       _____________________________________________________________  Should you have questions after your visit to [facility], please contact our office at [phone] between the hours of 8:30 a.m. and 4:30 p.m.  Voicemails left after 4:30 p.m. will not be returned until the following business day.  For prescription refill requests, have your pharmacy contact our office with your prescription refill request.     _______________________________________________________________  We hope that we have given you very good care.  You may receive a patient satisfaction survey in the mail, please complete it and return it as soon as possible.  We value your feedback!  _______________________________________________________________  Have you asked about our STAR program?  STAR stands for Survivorship Training and Rehabilitation, and this is a nationally recognized cancer care program that focuses on survivorship and rehabilitation.  Cancer and cancer treatments may cause problems, such as, pain, making you feel tired and keeping you from doing the things that you need or want to do. Cancer rehabilitation can help. Our goal is to reduce these troubling effects and help you have the best quality of life possible.  You may receive a survey from a nurse that asks questions about your current state of health.  Based on the survey results, all eligible patients will be referred to the [facility] program for an evaluation so we can better serve you!  A frequently asked questions sheet is available upon request.

## 2014-06-07 ENCOUNTER — Telehealth (HOSPITAL_COMMUNITY): Payer: Self-pay | Admitting: Oncology

## 2014-06-07 LAB — IRON AND TIBC
Iron: 106 ug/dL (ref 42–135)
Saturation Ratios: 27 % (ref 20–55)
TIBC: 387 ug/dL (ref 250–470)
UIBC: 281 ug/dL (ref 125–400)

## 2014-06-07 LAB — FERRITIN: Ferritin: 24 ng/mL (ref 10–291)

## 2014-06-07 NOTE — Telephone Encounter (Signed)
[employer] [phone] PER MANDY CALL REF# [id number] PRE CERT IS NOT REQUIRED FOR [id number] PAYS AT 100% OF ALLOWABLE NO COPAY

## 2014-06-09 ENCOUNTER — Other Ambulatory Visit: Payer: Self-pay | Admitting: Gastroenterology

## 2014-06-17 ENCOUNTER — Encounter (HOSPITAL_COMMUNITY)
Admission: RE | Admit: 2014-06-17 | Discharge: 2014-06-17 | Disposition: A | Discharge status: Home / Self Care | Payer: BC Managed Care – PPO | Source: Ambulatory Visit | Attending: Oncology | Admitting: Oncology

## 2014-06-17 ENCOUNTER — Ambulatory Visit (HOSPITAL_COMMUNITY)
Admission: RE | Admit: 2014-06-17 | Discharge: 2014-06-17 | Disposition: A | Discharge status: Home / Self Care | Payer: BC Managed Care – PPO | Source: Ambulatory Visit | Attending: Oncology | Admitting: Oncology

## 2014-06-17 DIAGNOSIS — R7989 Other specified abnormal findings of blood chemistry: Secondary | ICD-10-CM | POA: Insufficient documentation

## 2014-06-17 DIAGNOSIS — R790 Abnormal level of blood mineral: Secondary | ICD-10-CM

## 2014-06-17 DIAGNOSIS — M1710 Unilateral primary osteoarthritis, unspecified knee: Secondary | ICD-10-CM | POA: Insufficient documentation

## 2014-06-17 DIAGNOSIS — IMO0002 Reserved for concepts with insufficient information to code with codable children: Secondary | ICD-10-CM | POA: Insufficient documentation

## 2014-06-17 DIAGNOSIS — D72829 Elevated white blood cell count, unspecified: Secondary | ICD-10-CM

## 2014-06-17 DIAGNOSIS — M545 Low back pain, unspecified: Secondary | ICD-10-CM | POA: Insufficient documentation

## 2014-06-17 MED ORDER — TECHNETIUM TC 99M MEDRONATE IV KIT
24.3000 | PACK | Freq: Once | INTRAVENOUS | Status: AC | PRN
  Administered 2014-06-17: 24.3 via INTRAVENOUS

## 2014-06-17 NOTE — Progress Notes (Signed)
Message left

## 2014-06-22 ENCOUNTER — Encounter (HOSPITAL_COMMUNITY): Payer: Self-pay

## 2014-06-22 ENCOUNTER — Ambulatory Visit (HOSPITAL_COMMUNITY): Payer: BC Managed Care – PPO

## 2014-06-22 ENCOUNTER — Encounter (HOSPITAL_COMMUNITY): Payer: BC Managed Care – PPO | Attending: Hematology and Oncology

## 2014-06-22 ENCOUNTER — Encounter (HOSPITAL_BASED_OUTPATIENT_CLINIC_OR_DEPARTMENT_OTHER): Payer: BC Managed Care – PPO

## 2014-06-22 VITALS — BP 120/70 | HR 82 | Temp 98.4°F | Resp 20 | Wt 274.0 lb

## 2014-06-22 VITALS — BP 114/73 | HR 81 | Temp 98.4°F | Resp 18

## 2014-06-22 DIAGNOSIS — E611 Iron deficiency: Secondary | ICD-10-CM

## 2014-06-22 DIAGNOSIS — D500 Iron deficiency anemia secondary to blood loss (chronic): Secondary | ICD-10-CM

## 2014-06-22 DIAGNOSIS — I10 Essential (primary) hypertension: Secondary | ICD-10-CM

## 2014-06-22 DIAGNOSIS — D509 Iron deficiency anemia, unspecified: Secondary | ICD-10-CM

## 2014-06-22 DIAGNOSIS — K909 Intestinal malabsorption, unspecified: Secondary | ICD-10-CM

## 2014-06-22 DIAGNOSIS — K219 Gastro-esophageal reflux disease without esophagitis: Secondary | ICD-10-CM

## 2014-06-22 MED ORDER — SODIUM CHLORIDE 0.9 % IJ SOLN: 10.0000 mL | INTRAMUSCULAR | Status: DC | PRN

## 2014-06-22 MED ORDER — ALTEPLASE 2 MG IJ SOLR: 2.0000 mg | Freq: Once | INTRAMUSCULAR | Status: DC | PRN

## 2014-06-22 MED ORDER — SODIUM CHLORIDE 0.9 % IV SOLN
Freq: Once | INTRAVENOUS | Status: AC
Start: 2014-06-22 — End: 2014-06-22
  Administered 2014-06-22: 16:00:00 via INTRAVENOUS

## 2014-06-22 MED ORDER — SODIUM CHLORIDE 0.9 % IV SOLN
510.0000 mg | Freq: Once | INTRAVENOUS | Status: AC
  Administered 2014-06-22: 510 mg via INTRAVENOUS
  Filled 2014-06-22: qty 17

## 2014-06-22 NOTE — Patient Instructions (Signed)
[facility] Discharge Instructions  RECOMMENDATIONS MADE BY THE CONSULTANT AND ANY TEST RESULTS WILL BE SENT TO YOUR REFERRING PHYSICIAN.  Local heat or lumbosacral area using a heating pad on low and cover with a towel.   Feraheme intravenously today and in one week.   Office visit 6 weeks with CBC and ferritin.  Please call for any questions or concerns.     Thank you for choosing [facility] to provide your oncology and hematology care.  To afford each patient quality time with our providers, please arrive at least 15 minutes before your scheduled appointment time.  With your help, our goal is to use those 15 minutes to complete the necessary work-up to ensure our physicians have the information they need to help with your evaluation and healthcare recommendations.    Effective January 1st, 2014, we ask that you re-schedule your appointment with our physicians should you arrive 10 or more minutes late for your appointment.  We strive to give you quality time with our providers, and arriving late affects you and other patients whose appointments are after yours.    Again, thank you for choosing [facility].  Our hope is that these requests will decrease the amount of time that you wait before being seen by our physicians.       _____________________________________________________________  Should you have questions after your visit to [facility] please contact our office at [phone] between the hours of 8:30 a.m. and 4:30 p.m.  Voicemails left after 4:30 p.m. will not be returned until the following business day.  For prescription refill requests, have your pharmacy contact our office with your prescription refill request.    _______________________________________________________________  We hope that we have given you very good care.  You may receive a patient satisfaction survey in the mail, please complete it and  return it as soon as possible.  We value your feedback!  _______________________________________________________________  Have you asked about our STAR program?  STAR stands for Survivorship Training and Rehabilitation, and this is a nationally recognized cancer care program that focuses on survivorship and rehabilitation.  Cancer and cancer treatments may cause problems, such as, pain, making you feel tired and keeping you from doing the things that you need or want to do. Cancer rehabilitation can help. Our goal is to reduce these troubling effects and help you have the best quality of life possible.  You may receive a survey from a nurse that asks questions about your current state of health.  Based on the survey results, all eligible patients will be referred to the [facility] program for an evaluation so we can better serve you!  A frequently asked questions sheet is available upon request.

## 2014-06-22 NOTE — Progress Notes (Signed)
[name]  OFFICE PROGRESS NOTE  [place], Holly Rodriguez, [place] [address]  DIAGNOSIS: Iron deficiency  Iron deficiency anemia due to chronic blood loss  Malabsorption of iron  No chief complaint on file.   CURRENT THERAPY: Workup completed for anemia  INTERVAL HISTORY: Holly Rodriguez 48 y.o. female returns for followup and initiation of intravenous iron therapy for iron deficiency anemia in the setting of obstructive sleep apnea syndrome and gastroesophageal reflux disease with long-term use of proton pump inhibitors. Back pain slightly better. She denies any peripheral paresthesias, focal weakness, and did do heavy work prior to the initiation of her back pain. She does crave ice. She did see rectal bleeding but no overt cause was appreciated on colonoscopy.  MEDICAL HISTORY: Past Medical History  Diagnosis Date  . GERD (gastroesophageal reflux disease)   . Thyroid disease   . Anxiety   . IBS (irritable bowel syndrome)   . Sleep apnea     INTERIM HISTORY: has Acute appendicitis; Type 2 diabetes mellitus not at goal; Hypertension associated with diabetes; and Iron deficiency on her problem list.    ALLERGIES:  has No Known Allergies.  MEDICATIONS: has a current medication list which includes the following prescription(s): alprazolam, b complex-biotin-fa, glucose blood, hyoscyamine, onetouch ultrasoft, levothyroxine, lisinopril-hydrochlorothiazide, metformin, multiple vitamins-minerals, omega-3 fatty acids, omeprazole, potassium chloride, and vitamin d (ergocalciferol).  SURGICAL HISTORY:  Past Surgical History  Procedure Laterality Date  . Cesarean section    . Tonsillectomy    . Gastric restriction surgery    . Tubal ligation    . Cholecystectomy    . Laparoscopic appendectomy N/A 10/25/2013    Procedure: APPENDECTOMY LAPAROSCOPIC;  Surgeon: Jamesetta So, MD;  Location: [facility];  Service: General;  Laterality:  N/A;    FAMILY HISTORY: family history includes Alzheimer's disease in her maternal grandfather; Arthritis in her maternal grandmother and mother; Fibromyalgia in her mother; Heart disease in her maternal grandmother; Hyperlipidemia in her mother.  SOCIAL HISTORY:  reports that she has never smoked. She has never used smokeless tobacco. She reports that she drinks alcohol. She reports that she does not use illicit drugs.  REVIEW OF SYSTEMS:  Other than that discussed above is noncontributory.  PHYSICAL EXAMINATION: ECOG PERFORMANCE STATUS: 1 - Symptomatic but completely ambulatory  Blood pressure 120/70, pulse 82, temperature 98.4 F (36.9 C), temperature source Oral, resp. rate 20, weight 274 lb (124.286 kg).  GENERAL:alert, no distress and comfortable SKIN: skin color, texture, turgor are normal, no rashes or significant lesions EYES: PERLA; Conjunctiva are pink and non-injected, sclera clear SINUSES: No redness or tenderness over maxillary or ethmoid sinuses OROPHARYNX:no exudate, no erythema on lips, buccal mucosa, or tongue. NECK: supple, thyroid normal size, non-tender, without nodularity. No masses CHEST: Normal AP diameter with no breast masses. LYMPH:  no palpable lymphadenopathy in the cervical, axillary or inguinal LUNGS: clear to auscultation and percussion with normal breathing effort HEART: regular rate & rhythm and no murmurs. ABDOMEN:abdomen soft, non-tender and normal bowel sounds MUSCULOSKELETAL:no cyanosis of digits and no clubbing. Range of motion normal.  NEURO: alert & oriented x 3 with fluent speech, no focal motor/sensory deficits. Negative straight leg raising bilaterally.   LABORATORY DATA: Office Visit on 06/06/2014  Component Date Value Ref Range Status  . WBC 06/06/2014 10.3  4.0 - 10.5 K/uL Final  . RBC 06/06/2014 3.88  3.87 - 5.11 MIL/uL Final  . Hemoglobin 06/06/2014  11.6* 12.0 - 15.0 g/dL Final  . HCT 06/06/2014 34.7* 36.0 - 46.0 % Final  . MCV  06/06/2014 89.4  78.0 - 100.0 fL Final  . MCH 06/06/2014 29.9  26.0 - 34.0 pg Final  . MCHC 06/06/2014 33.4  30.0 - 36.0 g/dL Final  . RDW 06/06/2014 14.2  11.5 - 15.5 % Final  . Platelets 06/06/2014 305  150 - 400 K/uL Final  . Neutrophils Relative % 06/06/2014 53  43 - 77 % Final  . Neutro Abs 06/06/2014 5.5  1.7 - 7.7 K/uL Final  . Lymphocytes Relative 06/06/2014 37  12 - 46 % Final  . Lymphs Abs 06/06/2014 3.8  0.7 - 4.0 K/uL Final  . Monocytes Relative 06/06/2014 6  3 - 12 % Final  . Monocytes Absolute 06/06/2014 0.6  0.1 - 1.0 K/uL Final  . Eosinophils Relative 06/06/2014 3  0 - 5 % Final  . Eosinophils Absolute 06/06/2014 0.3  0.0 - 0.7 K/uL Final  . Basophils Relative 06/06/2014 1  0 - 1 % Final  . Basophils Absolute 06/06/2014 0.1  0.0 - 0.1 K/uL Final  . Sodium 06/06/2014 137  137 - 147 mEq/L Final  . Potassium 06/06/2014 4.3  3.7 - 5.3 mEq/L Final  . Chloride 06/06/2014 101  96 - 112 mEq/L Final  . CO2 06/06/2014 22  19 - 32 mEq/L Final  . Glucose, Bld 06/06/2014 90  70 - 99 mg/dL Final  . BUN 06/06/2014 15  6 - 23 mg/dL Final  . Creatinine, Ser 06/06/2014 0.80  0.50 - 1.10 mg/dL Final  . Calcium 06/06/2014 9.1  8.4 - 10.5 mg/dL Final  . GFR calc non Af Amer 06/06/2014 86* >90 mL/min Final  . GFR calc Af Amer 06/06/2014 >90  >90 mL/min Final   Comment: (NOTE)                          The eGFR has been calculated using the CKD EPI equation.                          This calculation has not been validated in all clinical situations.                          eGFR's persistently <90 mL/min signify possible Chronic Kidney                          Disease.  . Anion gap 06/06/2014 14  5 - 15 Final  . Ferritin 06/06/2014 24  10 - 291 ng/mL Final   Performed at [facility]  . Iron 06/06/2014 106  42 - 135 ug/dL Final  . TIBC 06/06/2014 387  250 - 470 ug/dL Final  . Saturation Ratios 06/06/2014 27  20 - 55 % Final  . UIBC 06/06/2014 281  125 - 400 ug/dL Final    Performed at [facility]: No new pathology.  Urinalysis    Component Value Date/Time   BILIRUBINUR neg 10/25/2013 1515   PROTEINUR neg 10/25/2013 1515   UROBILINOGEN negative 10/25/2013 1515   NITRITE neg 10/25/2013 1515   LEUKOCYTESUR Negative 10/25/2013 1515    RADIOGRAPHIC STUDIES: Nm Bone Scan Whole Body  06/17/2014   CLINICAL DATA:  Low back pain.  Elevated Rodriguez blood count  EXAM: NUCLEAR MEDICINE WHOLE BODY BONE SCAN  TECHNIQUE: Whole body anterior and posterior images were obtained approximately 3 hours after intravenous injection of radiopharmaceutical.  RADIOPHARMACEUTICALS:  24.3 mCi Technetium-99 MDP  COMPARISON:  None  FINDINGS: No worrisome skeletal uptake. No evidence of fracture or mass. Mild degenerative change in the knees.  IMPRESSION: No worrisome skeletal uptake.   Electronically Signed   By: Franchot Gallo M.D.   On: 06/17/2014 14:21   Mm Digital Diagnostic Unilat R  05/31/2014   CLINICAL DATA:  Possible right breast mass at recent screening mammography.  EXAM: DIGITAL DIAGNOSTIC  RIGHT MAMMOGRAM  ULTRASOUND RIGHT BREAST  COMPARISON:  Previous examinations, including the screening mammogram dated 05/17/2014.  ACR Breast Density Category b: There are scattered areas of fibroglandular density.  FINDINGS: Spot compression views of the right breast confirm a small, rounded, circumscribed nodule in the outer portion of the breast.  On physical exam, no mass is palpable in the outer right breast and there are no raised skin lesions.  Ultrasound is performed, showing a normal appearing intramammary lymph node in the 9 o'clock position of the right breast, 6 cm from the nipple, measuring 1.0 cm in maximum diameter. This corresponds to the mammographic mass.  IMPRESSION: Benign right breast intramammary lymph node. No evidence of malignancy.  RECOMMENDATION: Bilateral screening mammogram in 1 year.  I have discussed the findings and recommendations with the patient.  Results were also provided in writing at the conclusion of the visit. If applicable, a reminder letter will be sent to the patient regarding the next appointment.  BI-RADS CATEGORY  2: Benign.   Electronically Signed   By: Enrique Sack M.D.   On: 05/31/2014 15:56   US Breast Ltd Uni Right Inc Axilla  05/31/2014   CLINICAL DATA:  Possible right breast mass at recent screening mammography.  EXAM: DIGITAL DIAGNOSTIC  RIGHT MAMMOGRAM  ULTRASOUND RIGHT BREAST  COMPARISON:  Previous examinations, including the screening mammogram dated 05/17/2014.  ACR Breast Density Category b: There are scattered areas of fibroglandular density.  FINDINGS: Spot compression views of the right breast confirm a small, rounded, circumscribed nodule in the outer portion of the breast.  On physical exam, no mass is palpable in the outer right breast and there are no raised skin lesions.  Ultrasound is performed, showing a normal appearing intramammary lymph node in the 9 o'clock position of the right breast, 6 cm from the nipple, measuring 1.0 cm in maximum diameter. This corresponds to the mammographic mass.  IMPRESSION: Benign right breast intramammary lymph node. No evidence of malignancy.  RECOMMENDATION: Bilateral screening mammogram in 1 year.  I have discussed the findings and recommendations with the patient. Results were also provided in writing at the conclusion of the visit. If applicable, a reminder letter will be sent to the patient regarding the next appointment.  BI-RADS CATEGORY  2: Benign.   Electronically Signed   By: Enrique Sack M.D.   On: 05/31/2014 15:56    ASSESSMENT:  #1. Iron deficiency anemia. #2. Lower back muscle spasm. #3. Gastroesophageal reflux disease, on long-term proton pump inhibitor therapy. #4. Diabetes mellitus, type II, non-insulin requiring. #5. Hypertension, controlled.    PLAN:  #1. Local heat or lumbosacral area using a heating pad on low and cover with a towel. #2. Feraheme  intravenously today and in one week. #3. Office visit 6 weeks with CBC and ferritin.   All questions were answered. The patient knows to call the clinic with any problems, questions or concerns. We can certainly see  the patient much sooner if necessary.   I spent 25 minutes counseling the patient face to face. The total time spent in the appointment was 30 minutes.    Doroteo Bradford, MD 06/22/2014 9:06 PM  DISCLAIMER:  This note was dictated with voice recognition software.  Similar sounding words can inadvertently be transcribed inaccurately and may not be corrected upon review.

## 2014-06-23 ENCOUNTER — Telehealth: Payer: Self-pay | Admitting: Family Medicine

## 2014-06-29 ENCOUNTER — Encounter (HOSPITAL_BASED_OUTPATIENT_CLINIC_OR_DEPARTMENT_OTHER): Payer: BC Managed Care – PPO

## 2014-06-29 ENCOUNTER — Ambulatory Visit (HOSPITAL_COMMUNITY): Payer: BC Managed Care – PPO

## 2014-06-29 VITALS — BP 107/54 | HR 82 | Temp 98.6°F | Resp 18

## 2014-06-29 DIAGNOSIS — D509 Iron deficiency anemia, unspecified: Secondary | ICD-10-CM

## 2014-06-29 DIAGNOSIS — E611 Iron deficiency: Secondary | ICD-10-CM

## 2014-06-29 MED ORDER — SODIUM CHLORIDE 0.9 % IJ SOLN: 3.0000 mL | Freq: Once | INTRAMUSCULAR | Status: DC | PRN

## 2014-06-29 MED ORDER — SODIUM CHLORIDE 0.9 % IV SOLN
Freq: Once | INTRAVENOUS | Status: AC
  Administered 2014-06-29: 15:00:00 via INTRAVENOUS

## 2014-06-29 MED ORDER — SODIUM CHLORIDE 0.9 % IV SOLN
510.0000 mg | Freq: Once | INTRAVENOUS | Status: AC
  Administered 2014-06-29: 510 mg via INTRAVENOUS
  Filled 2014-06-29: qty 17

## 2014-06-29 NOTE — Progress Notes (Signed)
Patient tolerated infusion well.

## 2014-07-27 ENCOUNTER — Ambulatory Visit (HOSPITAL_COMMUNITY): Payer: BC Managed Care – PPO | Admitting: Oncology

## 2014-07-27 ENCOUNTER — Other Ambulatory Visit (HOSPITAL_COMMUNITY): Payer: BC Managed Care – PPO

## 2014-08-03 ENCOUNTER — Ambulatory Visit (HOSPITAL_COMMUNITY): Payer: BC Managed Care – PPO

## 2014-08-03 ENCOUNTER — Other Ambulatory Visit (HOSPITAL_COMMUNITY): Payer: BC Managed Care – PPO

## 2014-08-03 ENCOUNTER — Encounter (HOSPITAL_COMMUNITY): Payer: Self-pay

## 2014-08-03 ENCOUNTER — Encounter (HOSPITAL_COMMUNITY): Payer: BC Managed Care – PPO

## 2014-08-03 ENCOUNTER — Encounter (HOSPITAL_COMMUNITY): Payer: BC Managed Care – PPO | Attending: Hematology and Oncology

## 2014-08-03 VITALS — BP 126/84 | HR 90 | Temp 98.9°F | Resp 18 | Wt 269.6 lb

## 2014-08-03 DIAGNOSIS — D126 Benign neoplasm of colon, unspecified: Secondary | ICD-10-CM

## 2014-08-03 DIAGNOSIS — K648 Other hemorrhoids: Secondary | ICD-10-CM

## 2014-08-03 DIAGNOSIS — E611 Iron deficiency: Secondary | ICD-10-CM

## 2014-08-03 DIAGNOSIS — K9089 Other intestinal malabsorption: Secondary | ICD-10-CM

## 2014-08-03 DIAGNOSIS — D509 Iron deficiency anemia, unspecified: Secondary | ICD-10-CM | POA: Insufficient documentation

## 2014-08-03 DIAGNOSIS — IMO0001 Reserved for inherently not codable concepts without codable children: Secondary | ICD-10-CM | POA: Insufficient documentation

## 2014-08-03 DIAGNOSIS — K909 Intestinal malabsorption, unspecified: Secondary | ICD-10-CM

## 2014-08-03 DIAGNOSIS — K635 Polyp of colon: Secondary | ICD-10-CM

## 2014-08-03 DIAGNOSIS — K644 Residual hemorrhoidal skin tags: Secondary | ICD-10-CM

## 2014-08-03 LAB — CBC WITH DIFFERENTIAL/PLATELET
Basophils Absolute: 0.1 10*3/uL (ref 0.0–0.1)
Basophils Relative: 0 % (ref 0–1)
EOS ABS: 0.4 10*3/uL (ref 0.0–0.7)
Eosinophils Relative: 3 % (ref 0–5)
HCT: 38.6 % (ref 36.0–46.0)
Hemoglobin: 13.2 g/dL (ref 12.0–15.0)
LYMPHS ABS: 4.2 10*3/uL — AB (ref 0.7–4.0)
Lymphocytes Relative: 32 % (ref 12–46)
MCH: 31.8 pg (ref 26.0–34.0)
MCHC: 34.2 g/dL (ref 30.0–36.0)
MCV: 93 fL (ref 78.0–100.0)
Monocytes Absolute: 0.8 10*3/uL (ref 0.1–1.0)
Monocytes Relative: 6 % (ref 3–12)
NEUTROS ABS: 7.6 10*3/uL (ref 1.7–7.7)
NEUTROS PCT: 59 % (ref 43–77)
Platelets: 321 10*3/uL (ref 150–400)
RBC: 4.15 MIL/uL (ref 3.87–5.11)
RDW: 15.2 % (ref 11.5–15.5)
WBC: 13.1 10*3/uL — ABNORMAL HIGH (ref 4.0–10.5)

## 2014-08-03 NOTE — Progress Notes (Signed)
[facility]  OFFICE PROGRESS NOTE  [place], [place] [address]  DIAGNOSIS: Iron deficiency - Plan: CBC with Differential, Ferritin  Malabsorption of iron - Plan: CBC with Differential, Ferritin  Colon polyps  Hemorrhoids, external  Prolapsed internal hemorrhoids, grade 1  Chief Complaint  Patient presents with  . Iron deficiency due to malabsorption    CURRENT THERAPY: IV Feraheme on 8/5/ 2015 and 06/29/2014.  INTERVAL HISTORY: Holly Rodriguez 48 y.o. female returns for followup after receiving intravenous iron therapy for iron deficiency due to malabsorption of iron due to long-term use of proton pump inhibitors for gastroesophageal reflux disease in the setting of obstructive sleep apnea syndrome, internal and external hemorrhoids, and regular menstrual periods.  She tolerated IV iron well except for minimal muscle aches. She denies any joint pain or swelling. Energy level has increased to some degree. She is currently experiencing her monthly.Marland Kitchen Appetite and good with no nausea, vomiting, reflux symptoms, melena, hematochezia, hematuria, epistaxis, or mop this is. She denies any chest pain, PND, orthopnea, or palpitations.  MEDICAL HISTORY: Past Medical History  Diagnosis Date  . GERD (gastroesophageal reflux disease)   . Thyroid disease   . Anxiety   . IBS (irritable bowel syndrome)   . Sleep apnea     INTERIM HISTORY: has Acute appendicitis; Type 2 diabetes mellitus not at goal; Hypertension associated with diabetes; Iron deficiency; Colon polyps; Hemorrhoids, external; and Prolapsed internal hemorrhoids, grade 1 on her problem list.    ALLERGIES:  has No Known Allergies.  MEDICATIONS: has a current medication list which includes the following prescription(s): alprazolam, glucose blood, hyoscyamine, onetouch ultrasoft, levothyroxine, lisinopril-hydrochlorothiazide, metformin, multiple vitamins-minerals,  omega-3 fatty acids, omeprazole, potassium chloride, vitamin d (ergocalciferol), and b complex-biotin-fa.  SURGICAL HISTORY:  Past Surgical History  Procedure Laterality Date  . Cesarean section    . Tonsillectomy    . Gastric restriction surgery    . Tubal ligation    . Cholecystectomy    . Laparoscopic appendectomy N/A 10/25/2013    Procedure: APPENDECTOMY LAPAROSCOPIC;  Surgeon: Jamesetta So, MD;  Location: [facility];  Service: General;  Laterality: N/A;    FAMILY HISTORY: family history includes Alzheimer's disease in her maternal grandfather; Arthritis in her maternal grandmother and mother; Fibromyalgia in her mother; Heart disease in her maternal grandmother; Hyperlipidemia in her mother.  SOCIAL HISTORY:  reports that she has never smoked. She has never used smokeless tobacco. She reports that she drinks alcohol. She reports that she does not use illicit drugs.  REVIEW OF SYSTEMS:  Other than that discussed above is noncontributory.  PHYSICAL EXAMINATION: ECOG PERFORMANCE STATUS: 1 - Symptomatic but completely ambulatory  Blood pressure 126/84, pulse 90, temperature 98.9 F (37.2 C), temperature source Oral, resp. rate 18, weight 269 lb 9.6 oz (122.29 kg), SpO2 98.00%.  GENERAL:alert, no distress and comfortable. Morbidly obese. SKIN: skin color, texture, turgor are normal, no rashes or significant lesions EYES: PERLA; Conjunctiva are pink and non-injected, sclera clear SINUSES: No redness or tenderness over maxillary or ethmoid sinuses OROPHARYNX:no exudate, no erythema on lips, buccal mucosa, or tongue. NECK: supple, thyroid normal size, non-tender, without nodularity. No masses CHEST: Normal AP diameter with no breast masses. LYMPH:  no palpable lymphadenopathy in the cervical, axillary or inguinal LUNGS: clear to auscultation and percussion with normal breathing effort HEART: regular rate & rhythm and no murmurs. ABDOMEN:abdomen soft, non-tender and normal bowel sounds.  Obese with no appreciation of organomegaly. MUSCULOSKELETAL:no cyanosis of digits and no clubbing. Range of motion normal.  NEURO: alert & oriented x 3 with fluent speech, no focal motor/sensory deficits   LABORATORY DATA:  Results for Holly Rodriguez, Holly Rodriguez (MRN [id number]) as of 08/04/2014 06:17  Ref. Range 05/16/2014 16:32 06/06/2014 15:30 08/03/2014 15:30  Hemoglobin Latest Range: 12.0-15.0 g/dL 11.4 (A) 11.6 (L) 13.2    Appointment on 08/03/2014  Component Date Value Ref Range Status  . WBC 08/03/2014 13.1* 4.0 - 10.5 K/uL Final  . RBC 08/03/2014 4.15  3.87 - 5.11 MIL/uL Final  . Hemoglobin 08/03/2014 13.2  12.0 - 15.0 g/dL Final  . HCT 08/03/2014 38.6  36.0 - 46.0 % Final  . MCV 08/03/2014 93.0  78.0 - 100.0 fL Final  . MCH 08/03/2014 31.8  26.0 - 34.0 pg Final  . MCHC 08/03/2014 34.2  30.0 - 36.0 g/dL Final  . RDW 08/03/2014 15.2  11.5 - 15.5 % Final  . Platelets 08/03/2014 321  150 - 400 K/uL Final  . Neutrophils Relative % 08/03/2014 59  43 - 77 % Final  . Neutro Abs 08/03/2014 7.6  1.7 - 7.7 K/uL Final  . Lymphocytes Relative 08/03/2014 32  12 - 46 % Final  . Lymphs Abs 08/03/2014 4.2* 0.7 - 4.0 K/uL Final  . Monocytes Relative 08/03/2014 6  3 - 12 % Final  . Monocytes Absolute 08/03/2014 0.8  0.1 - 1.0 K/uL Final  . Eosinophils Relative 08/03/2014 3  0 - 5 % Final  . Eosinophils Absolute 08/03/2014 0.4  0.0 - 0.7 K/uL Final  . Basophils Relative 08/03/2014 0  0 - 1 % Final  . Basophils Absolute 08/03/2014 0.1  0.0 - 0.1 K/uL Final  . Ferritin 08/03/2014 220  10 - 291 ng/mL Final   Performed at [facility]: for Holly Rodriguez, Holly Rodriguez [phone]) Patient: Holly Rodriguez, Holly Rodriguez Collected: 06/09/2014 Client: [facility] Accession: [id number] Received: 06/13/2014 Clarene Essex, MD DOB: April 27, 1966 Age: 25 Gender: F Reported: 06/14/2014 [address] Patient Ph: MRN #: [id number] [place], [place] [address] Client Acc#: [phone] Chart #: [id number] Phone:  [phone] Fax: CC: REPORT OF SURGICAL PATHOLOGY FINAL DIAGNOSIS Diagnosis 1. Surgical [P], ascending and transverse, biopsy - TUBULAR ADENOMAS. NO HIGH GRADE DYSPLASIA OR MALIGNANCY IDENTIFIED. 2. Surgical [P], random colon, biopsy - UNREMARKABLE COLONIC MUCOSA. NO FEATURES OF MICROSCOPIC COLITIS, ACTIVE INFLAMMATION OR GRANULOMAS. 3. Surgical [P], descending and sigmoid, biopsy - TUBULAR ADENOMA AND HYPERPLASTIC POLYP. NO HIGH GRADE DYSPLASIA OR MALIGNANCY IDENTIFIED. Microscopic Comment 2. There is colorectal mucosa with normal crypt architecture and no objective increase in inflammation. No active inflammation, microscopic colitis, collagenous colitis or significant chronic change is identified. No hyperplastic or adenomatous changes are seen, and there is no evidence of malignancy. Claudette Laws MD Pathologist, Electronic Signature (Case signed 06/14/2014) Specimen Gross and Clinical Information Specimen(s) Obtained: 1. Surgical [P], ascending and transverse, biopsy 2. Surgical [P], random colon, biopsy 3. Surgical [P], descending and sigmoid, biopsy Specimen Clinical Information 2. For diarrhea 1 of 2 FINAL for Rodriguez, Holly Rodriguez) Gross 1. Received in formalin are tan, soft tissue fragments that are submitted in toto. Number: multiple, Size: 0.2 cm smallest to 0.3 cm largest, (1 B) ( mb ) 2. Received in formalin are tan, soft tissue fragments that are submitted in toto. Number: multiple, Size: 0.1 cm smallest to 0.2 cm largest, (1 B) ( mb ) 3. Received in formalin are tan, soft tissue  fragments that are submitted in toto. Number: multiple, Size: 0.3 cm smallest to 0.6 cm largest, (1 B) ( mb ) Technical component for this case was performed at [facility] CLIA# [id number] Report signed out from the following location(s) Technical Component performed at [facility]. [address],[place]  [id number]., Interpretation performed at [facility] [place], [place], [place] [address]. CLIA #: [id number],  Urinalysis    Component Value Date/Time   BILIRUBINUR neg 10/25/2013 1515   PROTEINUR neg 10/25/2013 1515   UROBILINOGEN negative 10/25/2013 1515   NITRITE neg 10/25/2013 1515   LEUKOCYTESUR Negative 10/25/2013 1515    RADIOGRAPHIC STUDIES:   NM Bone Scan Whole Body Status: Final result         PACS Images    Show images for NM Bone Scan Whole Body         Study Result    CLINICAL DATA: Low back pain. Elevated Rodriguez blood count  EXAM:  NUCLEAR MEDICINE WHOLE BODY BONE SCAN  TECHNIQUE:  Whole body anterior and posterior images were obtained approximately  3 hours after intravenous injection of radiopharmaceutical.  RADIOPHARMACEUTICALS: 24.3 mCi Technetium-99 MDP  COMPARISON: None  FINDINGS:  No worrisome skeletal uptake. No evidence of fracture or mass. Mild  degenerative change in the knees.  IMPRESSION:  No worrisome skeletal uptake.  Electronically Signed  By: Franchot Gallo M.D.  On: 06/17/2014      DG Chest 2 View Status: Final result         PACS Images    Show images for DG Chest 2 View         Study Result    CLINICAL DATA: Diabetes and hypertension.  EXAM:  CHEST 2 VIEW  COMPARISON: None.  FINDINGS:  The lungs are well-expanded and clear. The heart and mediastinal  structures are normal. The bony thorax is unremarkable.  IMPRESSION:  There is no acute cardiopulmonary disease.  Electronically Signed  By: David Martinique  On: 05/03/2014 08:24      ASSESSMENT:  #1. Iron deficiency secondary to malabsorption of iron, chronic blood loss from menstruation and GI bleeding from internal and external hemorrhoids. #2. Gastroesophageal reflux disease, on long-term proton pump inhibitors. #3. Diabetes mellitus, type II, non-insulin requiring, controlled. #4. Hypertension,  controlled.   PLAN:  #1. Followup in 3 months with CBC and ferritin. Plan is to maintain normal ferritin levels despite chronic blood loss and malabsorption of iron utilizing intravenous Feraheme infusions. Frequency of Feraheme infusions will depend upon tempo of CBC and ferritin values.    All questions were answered. The patient knows to call the clinic with any problems, questions or concerns. We can certainly see the patient much sooner if necessary.   I spent 25 minutes counseling the patient face to face. The total time spent in the appointment was 30 minutes.    Doroteo Bradford, MD 08/04/2014 6:18 AM  DISCLAIMER:  This note was dictated with voice recognition software.  Similar sounding words can inadvertently be transcribed inaccurately and may not be corrected upon review.

## 2014-08-03 NOTE — Patient Instructions (Signed)
[facility] Discharge Instructions  RECOMMENDATIONS MADE BY THE CONSULTANT AND ANY TEST RESULTS WILL BE SENT TO YOUR REFERRING PHYSICIAN.  We will see you in 3 months  For repeat lab work and a [facility] appointment. Please call for any questions or concerns.   Thank you for choosing [facility] to provide your oncology and hematology care.  To afford each patient quality time with our providers, please arrive at least 15 minutes before your scheduled appointment time.  With your help, our goal is to use those 15 minutes to complete the necessary work-up to ensure our physicians have the information they need to help with your evaluation and healthcare recommendations.    Effective January 1st, 2014, we ask that you re-schedule your appointment with our physicians should you arrive 10 or more minutes late for your appointment.  We strive to give you quality time with our providers, and arriving late affects you and other patients whose appointments are after yours.    Again, thank you for choosing [facility]  Our hope is that these requests will decrease the amount of time that you wait before being seen by our physicians.       _____________________________________________________________  Should you have questions after your visit to [facility], please contact our office at [phone] between the hours of 8:30 a.m. and 4:30 p.m.  Voicemails left after 4:30 p.m. will not be returned until the following business day.  For prescription refill requests, have your pharmacy contact our office with your prescription refill request.    _______________________________________________________________  We hope that we have given you very good care.  You may receive a patient satisfaction survey in the mail, please complete it and return it as soon as possible.  We value your  feedback!  _______________________________________________________________  Have you asked about our STAR program?  STAR stands for Survivorship Training and Rehabilitation, and this is a nationally recognized cancer care program that focuses on survivorship and rehabilitation.  Cancer and cancer treatments may cause problems, such as, pain, making you feel tired and keeping you from doing the things that you need or want to do. Cancer rehabilitation can help. Our goal is to reduce these troubling effects and help you have the best quality of life possible.  You may receive a survey from a nurse that asks questions about your current state of health.  Based on the survey results, all eligible patients will be referred to the [facility] program for an evaluation so we can better serve you!  A frequently asked questions sheet is available upon request.

## 2014-08-03 NOTE — Progress Notes (Signed)
Labs drawn from left ac, 23 gauge butterfly.  Pt tolerated well.

## 2014-08-04 LAB — FERRITIN: FERRITIN: 220 ng/mL (ref 10–291)

## 2014-08-09 NOTE — Progress Notes (Signed)
This encounter was created in error - please disregard.

## 2014-08-15 ENCOUNTER — Ambulatory Visit: Payer: BC Managed Care – PPO | Admitting: Family Medicine

## 2014-09-26 ENCOUNTER — Ambulatory Visit: Payer: BC Managed Care – PPO | Admitting: Family Medicine

## 2014-10-31 ENCOUNTER — Ambulatory Visit: Payer: BC Managed Care – PPO | Admitting: Family Medicine

## 2014-11-02 ENCOUNTER — Other Ambulatory Visit (HOSPITAL_COMMUNITY): Payer: BC Managed Care – PPO

## 2014-11-02 ENCOUNTER — Ambulatory Visit (HOSPITAL_COMMUNITY): Payer: BC Managed Care – PPO

## 2014-11-14 ENCOUNTER — Other Ambulatory Visit: Payer: Self-pay | Admitting: Family Medicine

## 2014-11-15 ENCOUNTER — Ambulatory Visit (INDEPENDENT_AMBULATORY_CARE_PROVIDER_SITE_OTHER): Payer: BC Managed Care – PPO | Admitting: Family Medicine

## 2014-11-15 ENCOUNTER — Encounter: Payer: Self-pay | Admitting: Family Medicine

## 2014-11-15 VITALS — BP 115/78 | HR 77 | Temp 97.6°F | Ht 63.5 in | Wt 283.0 lb

## 2014-11-15 DIAGNOSIS — K219 Gastro-esophageal reflux disease without esophagitis: Secondary | ICD-10-CM

## 2014-11-15 DIAGNOSIS — F411 Generalized anxiety disorder: Secondary | ICD-10-CM

## 2014-11-15 DIAGNOSIS — E039 Hypothyroidism, unspecified: Secondary | ICD-10-CM

## 2014-11-15 DIAGNOSIS — D509 Iron deficiency anemia, unspecified: Secondary | ICD-10-CM

## 2014-11-15 DIAGNOSIS — E119 Type 2 diabetes mellitus without complications: Secondary | ICD-10-CM

## 2014-11-15 DIAGNOSIS — G5690 Unspecified mononeuropathy of unspecified upper limb: Secondary | ICD-10-CM

## 2014-11-15 DIAGNOSIS — E1169 Type 2 diabetes mellitus with other specified complication: Secondary | ICD-10-CM

## 2014-11-15 DIAGNOSIS — I152 Hypertension secondary to endocrine disorders: Secondary | ICD-10-CM

## 2014-11-15 DIAGNOSIS — E1159 Type 2 diabetes mellitus with other circulatory complications: Secondary | ICD-10-CM

## 2014-11-15 DIAGNOSIS — E559 Vitamin D deficiency, unspecified: Secondary | ICD-10-CM

## 2014-11-15 DIAGNOSIS — I10 Essential (primary) hypertension: Secondary | ICD-10-CM

## 2014-11-15 MED ORDER — VITAMIN D (ERGOCALCIFEROL) 1.25 MG (50000 UNIT) PO CAPS: 50000.0000 [IU] | ORAL_CAPSULE | ORAL | Status: DC

## 2014-11-15 MED ORDER — ALPRAZOLAM 0.5 MG PO TABS: 1.0000 mg | ORAL_TABLET | Freq: Every evening | ORAL | Status: DC | PRN

## 2014-11-15 MED ORDER — METFORMIN HCL ER (OSM) 1000 MG PO TB24: 1000.0000 mg | ORAL_TABLET | Freq: Every day | ORAL | Status: DC

## 2014-11-15 NOTE — Progress Notes (Signed)
Subjective:    Patient ID: Holly Rodriguez, female    DOB: [birth year], 48 y.o.   MRN: [id number]  HPI Pt here for follow up and management of chronic medical problems. The patient complains of fatigue today. The patient Also complains of general myalgias and arthralgias and numbness in both hands with the right being worse than the left. She will return to the office for fasting lab work. She is requesting refills on her vitamin D, her Levbid, and her Xanax. She is also to be given an FOBT to return. The patient indicates that she has not been very compliant with taking her metformin twice a day for several months. She says she just forgets it at nighttime.         Patient Active Problem List   Diagnosis Date Noted  . Colon polyps 08/03/2014  . Hemorrhoids, external 08/03/2014  . Prolapsed internal hemorrhoids, grade 1 08/03/2014  . Iron deficiency 06/22/2014  . Type 2 diabetes mellitus not at goal 12/06/2013  . Hypertension associated with diabetes 12/06/2013  . Acute appendicitis 10/25/2013   Outpatient Encounter Prescriptions as of 11/15/2014  Medication Sig  . ALPRAZolam (XANAX) 0.5 MG tablet Take 1 tablet (0.5 mg total) by mouth at bedtime as needed for anxiety.  Marland Kitchen glucose blood test strip Reli-on glucometer.  Test Blood sugar qid Dx. 250.01  . hyoscyamine (LEVBID) 0.375 MG 12 hr tablet Take 1 tablet (0.375 mg total) by mouth 2 (two) times daily.  . Lancets (ONETOUCH ULTRASOFT) lancets Test 1X per day and prn   Dx 250.01  . levothyroxine (SYNTHROID) 75 MCG tablet Take 1 tablet (75 mcg total) by mouth daily before breakfast.  . lisinopril-hydrochlorothiazide (PRINZIDE,ZESTORETIC) 20-25 MG per tablet Take 1 tablet by mouth daily.  . metFORMIN (GLUCOPHAGE) 500 MG tablet Take 1 tablet (500 mg total) by mouth 2 (two) times daily with a meal.  . Multiple Vitamins-Minerals (ALIVE WOMENS ENERGY PO) Take 1 tablet by mouth daily.  . Omega-3 Fatty Acids (OMEGA 3 PO) Take 1 capsule by mouth  daily. Uses Mega Red 500 mg daily  . omeprazole (PRILOSEC) 40 MG capsule Take 1 capsule (40 mg total) by mouth daily.  . potassium chloride (KLOR-CON 10) 10 MEQ tablet Take 1 tablet (10 mEq total) by mouth daily.  . Vitamin D, Ergocalciferol, (DRISDOL) 50000 UNITS CAPS capsule Take 1 capsule (50,000 Units total) by mouth every 7 (seven) days.  . [DISCONTINUED] B Complex-Biotin-FA (B-COMPLEX PO) Take 1 tablet by mouth daily.  . [DISCONTINUED] hyoscyamine (LEVBID) 0.375 MG 12 hr tablet TAKE 1 TABLET TWICE A DAY    Review of Systems  Constitutional: Positive for fatigue.  HENT: Negative.   Eyes: Negative.   Respiratory: Negative.   Cardiovascular: Negative.   Gastrointestinal: Negative.   Endocrine: Negative.   Genitourinary: Negative.   Musculoskeletal: Positive for myalgias and arthralgias.  Skin: Negative.   Allergic/Immunologic: Negative.   Neurological: Positive for numbness (of bilateral hands right is worse).  Hematological: Negative.   Psychiatric/Behavioral: Negative.        Objective:   Physical Exam  Constitutional: She is oriented to person, place, and time. She appears well-developed and well-nourished. No distress.  Pleasant and kind and alert  HENT:  Head: Normocephalic and atraumatic.  Right Ear: External ear normal.  Left Ear: External ear normal.  Nose: Nose normal.  The throat was slightly red posteriorly  Eyes: Conjunctivae and EOM are normal. Pupils are equal, round, and reactive to light. Right eye exhibits no  discharge. Left eye exhibits no discharge. No scleral icterus.  Neck: Normal range of motion. Neck supple. No thyromegaly present.  No anterior cervical nodes or carotid bruits  Cardiovascular: Normal rate, regular rhythm, normal heart sounds and intact distal pulses.  Exam reveals no gallop and no friction rub.   No murmur heard. The heart has a regular rate and rhythm at 84/m  Pulmonary/Chest: Effort normal and breath sounds normal. No respiratory  distress. She has no wheezes. She has no rales. She exhibits no tenderness.  Lungs are clear anteriorly and posteriorly  Abdominal: Soft. Bowel sounds are normal. She exhibits no mass. There is no tenderness. There is no rebound and no guarding.  The abdomen is morbidly obese and there was slight tenderness in the left upper quadrant. There were no inguinal nodes.  Musculoskeletal: Normal range of motion. She exhibits no edema or tenderness.  Lymphadenopathy:    She has no cervical adenopathy.  Neurological: She is alert and oriented to person, place, and time. She has normal reflexes. No cranial nerve deficit.  Upper extremity reflexes were slightly decreased and lower extremity reflexes were equal bilaterally  Skin: Skin is warm and dry. No rash noted.  Psychiatric: She has a normal mood and affect. Her behavior is normal. Judgment and thought content normal.  Nursing note and vitals reviewed.  BP 115/78 mmHg  Pulse 77  Temp(Src) 97.6 F (36.4 C) (Oral)  Ht 5' 3.5" (1.613 m)  Wt 283 lb (128.368 kg)  BMI 49.34 kg/m2        Assessment & Plan:  1. Type 2 diabetes mellitus not at goal - POCT glycosylated hemoglobin (Hb A1C); Future - BMP8+EGFR; Future - metformin (FORTAMET) 1000 MG (OSM) 24 hr tablet; Take 1 tablet (1,000 mg total) by mouth daily with breakfast.  Dispense: 90 tablet; Refill: 3  2. Hypertension associated with diabetes - BMP8+EGFR; Future - Hepatic function panel; Future - NMR, lipoprofile; Future  3. Hypothyroidism, unspecified hypothyroidism type - Thyroid Panel With TSH; Future  4. Vitamin D deficiency - Vit D  25 hydroxy (rtn osteoporosis monitoring); Future  5. Gastroesophageal reflux disease, esophagitis presence not specified - NMR, lipoprofile; Future  6. Anemia, iron deficiency - Anemia Profile B; Future - Ambulatory referral to Hematology / Oncology  7. Anxiety state - ALPRAZolam (XANAX) 0.5 MG tablet; Take 2 tablets (1 mg total) by mouth at  bedtime as needed for anxiety.  Dispense: 90 tablet; Refill: 1  8. Morbid obesity  9. Neuropathy of hand, unspecified laterality  Meds ordered this encounter  Medications  . ALPRAZolam (XANAX) 0.5 MG tablet    Sig: Take 2 tablets (1 mg total) by mouth at bedtime as needed for anxiety.    Dispense:  90 tablet    Refill:  1  . Vitamin D, Ergocalciferol, (DRISDOL) 50000 UNITS CAPS capsule    Sig: Take 1 capsule (50,000 Units total) by mouth every 7 (seven) days.    Dispense:  12 capsule    Refill:  1  . metformin (FORTAMET) 1000 MG (OSM) 24 hr tablet    Sig: Take 1 tablet (1,000 mg total) by mouth daily with breakfast.    Dispense:  90 tablet    Refill:  3   Patient Instructions  Continue current medications. Continue good therapeutic lifestyle changes which include good diet and exercise. Fall precautions discussed with patient. If an FOBT was given today- please return it to our front desk. If you are over 48 years old - you may  need Prevnar 13 or the adult Pneumonia vaccine.  Flu Shots will be available at our office starting mid- September. Please call and schedule a FLU CLINIC APPOINTMENT.   We will order the long acting metformin and when that comes and you can just take it once a day. In the meantime try to be prudent and take the 500 mg metformin twice daily. Try to stick to your diet more closely Try to get up from your work position and walk around several times during the day Drink more water Use wrist brace at night as directed. Try the right hand first.   Arrie Senate MD

## 2014-11-15 NOTE — Patient Instructions (Addendum)
Continue current medications. Continue good therapeutic lifestyle changes which include good diet and exercise. Fall precautions discussed with patient. If an FOBT was given today- please return it to our front desk. If you are over 48 years old - you may need Prevnar 21 or the adult Pneumonia vaccine.  Flu Shots will be available at our office starting mid- September. Please call and schedule a FLU CLINIC APPOINTMENT.   We will order the long acting metformin and when that comes and you can just take it once a day. In the meantime try to be prudent and take the 500 mg metformin twice daily. Try to stick to your diet more closely Try to get up from your work position and walk around several times during the day Drink more water Use wrist brace at night as directed. Try the right hand first. Use scent free soaps, fabric softeners, and detergents.

## 2014-11-17 ENCOUNTER — Telehealth: Payer: Self-pay | Admitting: Hematology

## 2014-11-17 ENCOUNTER — Other Ambulatory Visit (INDEPENDENT_AMBULATORY_CARE_PROVIDER_SITE_OTHER): Payer: BC Managed Care – PPO

## 2014-11-17 DIAGNOSIS — E039 Hypothyroidism, unspecified: Secondary | ICD-10-CM

## 2014-11-17 DIAGNOSIS — I10 Essential (primary) hypertension: Secondary | ICD-10-CM

## 2014-11-17 DIAGNOSIS — D509 Iron deficiency anemia, unspecified: Secondary | ICD-10-CM

## 2014-11-17 DIAGNOSIS — E119 Type 2 diabetes mellitus without complications: Secondary | ICD-10-CM

## 2014-11-17 DIAGNOSIS — I152 Hypertension secondary to endocrine disorders: Secondary | ICD-10-CM

## 2014-11-17 DIAGNOSIS — E1159 Type 2 diabetes mellitus with other circulatory complications: Secondary | ICD-10-CM

## 2014-11-17 DIAGNOSIS — E559 Vitamin D deficiency, unspecified: Secondary | ICD-10-CM

## 2014-11-17 DIAGNOSIS — K219 Gastro-esophageal reflux disease without esophagitis: Secondary | ICD-10-CM

## 2014-11-17 LAB — POCT GLYCOSYLATED HEMOGLOBIN (HGB A1C): Hemoglobin A1C: 6.3

## 2014-11-17 NOTE — Progress Notes (Signed)
Lab only for Dr Laurance Flatten

## 2014-11-17 NOTE — Telephone Encounter (Signed)
S/W PATIENT AND GAVE NP APPT FOR 01/21 @ 1:30 W/DR. FENG.  REFERRING DR. Elenore Rota MOORE DX- ANEMIA, IDA

## 2014-11-18 LAB — THYROID PANEL WITH TSH
FREE THYROXINE INDEX: 2.6 (ref 1.2–4.9)
T3 UPTAKE RATIO: 30 % (ref 24–39)
T4, Total: 8.7 ug/dL (ref 4.5–12.0)
TSH: 2.18 u[IU]/mL (ref 0.450–4.500)

## 2014-11-18 LAB — ANEMIA PROFILE B
BASOS ABS: 0.1 10*3/uL (ref 0.0–0.2)
Basos: 1 %
EOS: 3 %
Eosinophils Absolute: 0.3 10*3/uL (ref 0.0–0.4)
FOLATE: 9.6 ng/mL (ref >3.0)
Ferritin: 133 ng/mL (ref 15–150)
HCT: 33.5 % — ABNORMAL LOW (ref 34.0–46.6)
Hemoglobin: 11.8 g/dL (ref 11.1–15.9)
IRON: 58 ug/dL (ref 35–155)
Immature Grans (Abs): 0 10*3/uL (ref 0.0–0.1)
Immature Granulocytes: 0 %
Iron Saturation: 18 % (ref 15–55)
LYMPHS: 32 %
Lymphocytes Absolute: 3.4 10*3/uL — ABNORMAL HIGH (ref 0.7–3.1)
MCH: 31.8 pg (ref 26.6–33.0)
MCHC: 35.2 g/dL (ref 31.5–35.7)
MCV: 90 fL (ref 79–97)
Monocytes Absolute: 0.7 10*3/uL (ref 0.1–0.9)
Monocytes: 6 %
NEUTROS ABS: 6.3 10*3/uL (ref 1.4–7.0)
NEUTROS PCT: 58 %
Platelets: 278 10*3/uL (ref 150–379)
RBC: 3.71 x10E6/uL — AB (ref 3.77–5.28)
RDW: 13.6 % (ref 12.3–15.4)
Retic Ct Pct: 2 % (ref 0.6–2.6)
TIBC: 330 ug/dL (ref 250–450)
UIBC: 272 ug/dL (ref 150–375)
VITAMIN B 12: 546 pg/mL (ref 211–946)
WBC: 10.8 10*3/uL (ref 3.4–10.8)

## 2014-11-18 LAB — BMP8+EGFR
BUN/Creatinine Ratio: 23 (ref 9–23)
BUN: 18 mg/dL (ref 6–24)
CALCIUM: 9 mg/dL (ref 8.7–10.2)
CHLORIDE: 99 mmol/L (ref 97–108)
CO2: 23 mmol/L (ref 18–29)
CREATININE: 0.77 mg/dL (ref 0.57–1.00)
GFR calc Af Amer: 106 mL/min/{1.73_m2} (ref >59)
GFR calc non Af Amer: 92 mL/min/{1.73_m2} (ref >59)
GLUCOSE: 108 mg/dL — AB (ref 65–99)
Potassium: 4.6 mmol/L (ref 3.5–5.2)
Sodium: 137 mmol/L (ref 134–144)

## 2014-11-18 LAB — HEPATIC FUNCTION PANEL
ALT: 20 IU/L (ref 0–32)
AST: 17 IU/L (ref 0–40)
Albumin: 4.2 g/dL (ref 3.5–5.5)
Alkaline Phosphatase: 82 IU/L (ref 39–117)
BILIRUBIN DIRECT: 0.09 mg/dL (ref 0.00–0.40)
BILIRUBIN TOTAL: 0.3 mg/dL (ref 0.0–1.2)
Total Protein: 6.9 g/dL (ref 6.0–8.5)

## 2014-11-18 LAB — NMR, LIPOPROFILE
CHOLESTEROL: 194 mg/dL (ref 100–199)
HDL Cholesterol by NMR: 36 mg/dL — ABNORMAL LOW (ref >39)
HDL PARTICLE NUMBER: 28.5 umol/L — AB (ref >=30.5)
LDL Particle Number: 1737 nmol/L — ABNORMAL HIGH (ref <1000)
LDL Size: 19.8 nm (ref >20.5)
LDL-C: 92 mg/dL (ref 0–99)
LP-IR SCORE: 91 — AB (ref <=45)
SMALL LDL PARTICLE NUMBER: 1371 nmol/L — AB (ref <=527)
Triglycerides by NMR: 332 mg/dL — ABNORMAL HIGH (ref 0–149)

## 2014-11-18 LAB — VITAMIN D 25 HYDROXY (VIT D DEFICIENCY, FRACTURES): Vit D, 25-Hydroxy: 24.6 ng/mL — ABNORMAL LOW (ref 30.0–100.0)

## 2014-12-08 ENCOUNTER — Other Ambulatory Visit: Payer: BC Managed Care – PPO

## 2014-12-08 ENCOUNTER — Telehealth: Payer: Self-pay | Admitting: Hematology

## 2014-12-08 ENCOUNTER — Ambulatory Visit: Payer: BC Managed Care – PPO | Admitting: Hematology

## 2014-12-08 ENCOUNTER — Ambulatory Visit: Payer: BC Managed Care – PPO

## 2014-12-08 NOTE — Telephone Encounter (Signed)
Pt aware of np appt. 12/20/14@10 :30

## 2014-12-20 ENCOUNTER — Other Ambulatory Visit: Payer: BLUE CROSS/BLUE SHIELD

## 2014-12-20 ENCOUNTER — Telehealth: Payer: Self-pay | Admitting: Hematology

## 2014-12-20 ENCOUNTER — Encounter: Payer: Self-pay | Admitting: Hematology

## 2014-12-20 ENCOUNTER — Ambulatory Visit (HOSPITAL_BASED_OUTPATIENT_CLINIC_OR_DEPARTMENT_OTHER): Payer: BLUE CROSS/BLUE SHIELD | Admitting: Hematology

## 2014-12-20 ENCOUNTER — Ambulatory Visit: Payer: BLUE CROSS/BLUE SHIELD

## 2014-12-20 ENCOUNTER — Ambulatory Visit (HOSPITAL_BASED_OUTPATIENT_CLINIC_OR_DEPARTMENT_OTHER): Payer: BLUE CROSS/BLUE SHIELD

## 2014-12-20 VITALS — BP 123/61 | HR 98 | Temp 98.2°F | Resp 18 | Ht 63.5 in | Wt 281.2 lb

## 2014-12-20 DIAGNOSIS — E669 Obesity, unspecified: Secondary | ICD-10-CM

## 2014-12-20 DIAGNOSIS — E119 Type 2 diabetes mellitus without complications: Secondary | ICD-10-CM

## 2014-12-20 DIAGNOSIS — D509 Iron deficiency anemia, unspecified: Secondary | ICD-10-CM

## 2014-12-20 DIAGNOSIS — K219 Gastro-esophageal reflux disease without esophagitis: Secondary | ICD-10-CM

## 2014-12-20 DIAGNOSIS — K635 Polyp of colon: Secondary | ICD-10-CM

## 2014-12-20 LAB — IRON AND TIBC CHCC
%SAT: 22 % (ref 21–57)
IRON: 72 ug/dL (ref 41–142)
TIBC: 324 ug/dL (ref 236–444)
UIBC: 252 ug/dL (ref 120–384)

## 2014-12-20 LAB — CBC & DIFF AND RETIC
BASO%: 0.6 % (ref 0.0–2.0)
Basophils Absolute: 0.1 10*3/uL (ref 0.0–0.1)
EOS%: 3.3 % (ref 0.0–7.0)
Eosinophils Absolute: 0.4 10*3/uL (ref 0.0–0.5)
HEMATOCRIT: 36.9 % (ref 34.8–46.6)
HGB: 12.4 g/dL (ref 11.6–15.9)
IMMATURE RETIC FRACT: 7.9 % (ref 1.60–10.00)
LYMPH%: 33.3 % (ref 14.0–49.7)
MCH: 31.2 pg (ref 25.1–34.0)
MCHC: 33.6 g/dL (ref 31.5–36.0)
MCV: 92.7 fL (ref 79.5–101.0)
MONO#: 0.9 10*3/uL (ref 0.1–0.9)
MONO%: 6.9 % (ref 0.0–14.0)
NEUT#: 7.1 10*3/uL — ABNORMAL HIGH (ref 1.5–6.5)
NEUT%: 55.9 % (ref 38.4–76.8)
Platelets: 278 10*3/uL (ref 145–400)
RBC: 3.98 10*6/uL (ref 3.70–5.45)
RDW: 13.4 % (ref 11.2–14.5)
RETIC %: 1.86 % (ref 0.70–2.10)
Retic Ct Abs: 74.03 10*3/uL (ref 33.70–90.70)
WBC: 12.6 10*3/uL — ABNORMAL HIGH (ref 3.9–10.3)
lymph#: 4.2 10*3/uL — ABNORMAL HIGH (ref 0.9–3.3)

## 2014-12-20 LAB — FERRITIN CHCC: Ferritin: 97 ng/ml (ref 9–269)

## 2014-12-20 NOTE — Progress Notes (Signed)
Checked in new pt with no financial concerns prior to seeing the dr. [name] is here for a hematology concern so financial assistance may not be needed but she has my card for any billing or insurance questions or concerns.

## 2014-12-20 NOTE — Telephone Encounter (Signed)
appts complete. pt sent back to lab and given scheduled for may.

## 2014-12-20 NOTE — Progress Notes (Signed)
SKIN. He is a large think is a still states he is a  [facility]  [phone] [phone]  Clinic Follow up Note   Patient Care Team: Chipper Herb, MD as PCP - General (Family Medicine) 12/20/2014  DIAGNOSIS: iron deficient anemia  Treatment: Feraheme 510 mg IV twice in August 2015  INTERVAL HISTORY: Holly Rodriguez is a 49 year old female coming in today for follow-up of her anemia of iron deficiency. She was previously seen by Dr. Orland Mustard at [facility], and he wishes to be transferred here which is closer to her work place.  She had gastric restriction surgery 3 times when she was young. She has regular period, it lasts about 7 days, heavy for the first few days, changes pad 4-5 times daily. She denies significant GI bleeding. Her last colonoscopy was in July 2015 which showed a few polyps no bleeding.   She denied any bleeding episodes including hematochezia, melana, hemoptysis, hematuria or epitaxis. No mucosal bleeding or easy bruising.   She feels well overall, mild fatigue during the day, but able to walk for time and to daily living activities without limitations. She has sinus drainage in the past 1 week, getting better, dry cough. No fever or chills. No recent weight loss.    REVIEW OF SYSTEMS:   Constitutional: Denies fevers, chills or abnormal weight loss Eyes: Denies blurriness of vision Ears, nose, mouth, throat, and face: Denies mucositis or sore throat Respiratory: Denies cough, dyspnea or wheezes Cardiovascular: Denies palpitation, chest discomfort or lower extremity swelling Gastrointestinal:  Denies nausea, heartburn or change in bowel habits Skin: Denies abnormal skin rashes Lymphatics: Denies new lymphadenopathy or easy bruising Neurological:Denies numbness, tingling or new weaknesses Behavioral/Psych: Mood is stable, no new changes  All other systems were reviewed with the patient and are negative.  MEDICAL HISTORY:  Past  Medical History  Diagnosis Date  . GERD (gastroesophageal reflux disease)   . Thyroid disease   . Anxiety   . IBS (irritable bowel syndrome)   . Sleep apnea   . Diabetes mellitus without complication     SURGICAL HISTORY: Past Surgical History  Procedure Laterality Date  . Cesarean section    . Tonsillectomy    . Gastric restriction surgery    . Tubal ligation    . Cholecystectomy    . Laparoscopic appendectomy N/A 10/25/2013    Procedure: APPENDECTOMY LAPAROSCOPIC;  Surgeon: Jamesetta So, MD;  Location: [facility];  Service: General;  Laterality: N/A;    I have reviewed the social history and family history with the patient and they are unchanged from previous note.  ALLERGIES:  has No Known Allergies.  MEDICATIONS:  Current Outpatient Prescriptions  Medication Sig Dispense Refill  . ALPRAZolam (XANAX) 0.5 MG tablet Take 2 tablets (1 mg total) by mouth at bedtime as needed for anxiety. 90 tablet 1  . glucose blood test strip Reli-on glucometer.  Test Blood sugar qid Dx. 250.01    . hyoscyamine (LEVBID) 0.375 MG 12 hr tablet Take 1 tablet (0.375 mg total) by mouth 2 (two) times daily. 180 tablet 3  . Lancets (ONETOUCH ULTRASOFT) lancets Test 1X per day and prn   Dx 250.01 100 each 12  . levothyroxine (SYNTHROID) 75 MCG tablet Take 1 tablet (75 mcg total) by mouth daily before breakfast. 90 tablet 3  . lisinopril-hydrochlorothiazide (PRINZIDE,ZESTORETIC) 20-25 MG per tablet Take 1 tablet by mouth daily. 90 tablet 2  . metFORMIN (GLUCOPHAGE) 500 MG tablet Take 1  tablet (500 mg total) by mouth 2 (two) times daily with a meal. 180 tablet 3  . Multiple Vitamins-Minerals (ALIVE WOMENS ENERGY PO) Take 1 tablet by mouth daily.    . Omega-3 Fatty Acids (OMEGA 3 PO) Take 1 capsule by mouth daily. Uses Mega Red 500 mg daily    . omeprazole (PRILOSEC) 40 MG capsule Take 1 capsule (40 mg total) by mouth daily. 90 capsule 3  . potassium chloride (KLOR-CON 10) 10 MEQ tablet Take 1 tablet (10 mEq  total) by mouth daily. 90 tablet 3  . Vitamin D, Ergocalciferol, (DRISDOL) 50000 UNITS CAPS capsule Take 1 capsule (50,000 Units total) by mouth every 7 (seven) days. 12 capsule 1  . metformin (FORTAMET) 1000 MG (OSM) 24 hr tablet Take 1 tablet (1,000 mg total) by mouth daily with breakfast. (Patient not taking: Reported on 12/20/2014) 90 tablet 3   No current facility-administered medications for this visit.    PHYSICAL EXAMINATION: ECOG PERFORMANCE STATUS: 0 - Asymptomatic  Filed Vitals:   12/20/14 1108  BP: 123/61  Pulse: 98  Temp: 98.2 F (36.8 C)  Resp: 18   Filed Weights   12/20/14 1108  Weight: 281 lb 3.2 oz (127.551 kg)    GENERAL:alert, no distress and comfortable SKIN: skin color, texture, turgor are normal, no rashes or significant lesions EYES: normal, Conjunctiva are pink and non-injected, sclera clear OROPHARYNX:no exudate, no erythema and lips, buccal mucosa, and tongue normal  NECK: supple, thyroid normal size, non-tender, without nodularity LYMPH:  no palpable lymphadenopathy in the cervical, axillary or inguinal LUNGS: clear to auscultation and percussion with normal breathing effort HEART: regular rate & rhythm and no murmurs and no lower extremity edema ABDOMEN:abdomen soft, non-tender and normal bowel sounds Musculoskeletal:no cyanosis of digits and no clubbing  NEURO: alert & oriented x 3 with fluent speech, no focal motor/sensory deficits  LABORATORY DATA:  I have reviewed the data as listed CBC Latest Ref Rng 12/20/2014 11/17/2014 08/03/2014  WBC 3.9 - 10.3 10e3/uL 12.6(H) 10.8 13.1(H)  Hemoglobin 11.6 - 15.9 g/dL 12.4 11.8 13.2  Hematocrit 34.8 - 46.6 % 36.9 33.5(L) 38.6  Platelets 145 - 400 10e3/uL 278 278 321     CMP Latest Ref Rng 11/17/2014 06/06/2014 05/02/2014  Glucose 65 - 99 mg/dL 108(H) 90 97  BUN 6 - 24 mg/dL 18 15 16   Creatinine 0.57 - 1.00 mg/dL 0.77 0.80 0.81  Sodium 134 - 144 mmol/L 137 137 136  Potassium 3.5 - 5.2 mmol/L 4.6 4.3 4.7    Chloride 97 - 108 mmol/L 99 101 98  CO2 18 - 29 mmol/L 23 22 26   Calcium 8.7 - 10.2 mg/dL 9.0 9.1 9.2  Total Protein 6.0 - 8.5 g/dL 6.9 - 6.8  Albumin 3.5 - 5.5 g/dL 4.2 - 4.3  Total Bilirubin 0.0 - 1.2 mg/dL 0.3 - <0.2  Alkaline Phos 39 - 117 IU/L 82 - 81  AST 0 - 40 IU/L 17 - 15  ALT 0 - 32 IU/L 20 - 16      RADIOGRAPHIC STUDIES: I have personally reviewed the radiological images as listed and agreed with the findings in the report. No results found.   ASSESSMENT & PLAN:  49 year old female with past medical history of obesity and gastric obstruction surgery, colon polyps, history of iron deficient anemia, here for follow-up.  1. History of iron deficient anemia, presumably secondary to malabsorption of iron, chronic blood loss from menstruation and the possible GI bleeding from hemorroids -Her blood counts normal today,  with hemoglobin 12.4. She denies any recent history of bleeding. Her last colonoscopy was July 2015, which showed multiple polyps status post polypectomy. -Her ferritin and iron study results are still pending from today, I'll consider IV Feraheme if she is lab evidence of iron deficiency. -I encouraged her to take oral iron pill 1 tablet day with along with orange juice.  -Follow-up CBC and iron study every 3 months. If she has normal iron level and hemoglobin with po iron supplement, she can be follow-up with her primary care physician in the future.  2. Colon Polyps -Biopsy showed total bilirubin adenoma in July 2015. -She'll follow-up with her gastroenterologist Dr. Watt Climes.   3. Diabetes, GERD, obesity -She will continue follow-up with her primary care physician.  Follow-up: Return in 3 months with labs CBC and iron study.   Orders Placed This Encounter  Procedures  . CBC & Diff and Retic    Standing Status: Standing     Number of Occurrences: 10     Standing Expiration Date: 12/21/2015  . Ferritin    Standing Status: Standing     Number of  Occurrences: 10     Standing Expiration Date: 12/21/2015  . Iron and TIBC [facility]    Standing Status: Standing     Number of Occurrences: 10     Standing Expiration Date: 12/21/2015   All questions were answered. The patient knows to call the clinic with any problems, questions or concerns. No barriers to learning was detected. I spent 30 minutes counseling the patient face to face. The total time spent in the appointment was 30 minutes and more than 50% was on counseling and review of test results     Truitt Merle, MD  12/20/2014   1:12 PM

## 2015-01-24 ENCOUNTER — Other Ambulatory Visit: Payer: BLUE CROSS/BLUE SHIELD

## 2015-01-24 DIAGNOSIS — Z1212 Encounter for screening for malignant neoplasm of rectum: Secondary | ICD-10-CM

## 2015-01-24 NOTE — Progress Notes (Signed)
LAB ONLY 

## 2015-01-26 LAB — FECAL OCCULT BLOOD, IMMUNOCHEMICAL: Fecal Occult Bld: NEGATIVE

## 2015-02-23 ENCOUNTER — Other Ambulatory Visit: Payer: Self-pay | Admitting: Family Medicine

## 2015-03-14 ENCOUNTER — Telehealth: Payer: Self-pay | Admitting: Hematology

## 2015-03-14 NOTE — Telephone Encounter (Signed)
Returned patient voicemail to r/s appointment. Patient confirmed appointment for 07/13, Mailed calendar.

## 2015-03-21 ENCOUNTER — Other Ambulatory Visit: Payer: BLUE CROSS/BLUE SHIELD

## 2015-03-21 ENCOUNTER — Ambulatory Visit: Payer: BLUE CROSS/BLUE SHIELD | Admitting: Hematology

## 2015-03-23 ENCOUNTER — Ambulatory Visit: Payer: BC Managed Care – PPO | Admitting: Family Medicine

## 2015-04-11 ENCOUNTER — Telehealth: Payer: Self-pay | Admitting: Family Medicine

## 2015-04-11 NOTE — Telephone Encounter (Signed)
Patient aware that she will need to be seen for referral so we have not seen her for the issues she is having.

## 2015-04-14 ENCOUNTER — Ambulatory Visit (INDEPENDENT_AMBULATORY_CARE_PROVIDER_SITE_OTHER): Payer: BLUE CROSS/BLUE SHIELD | Admitting: Family Medicine

## 2015-04-14 ENCOUNTER — Encounter: Payer: Self-pay | Admitting: Family Medicine

## 2015-04-14 VITALS — BP 121/83 | HR 85 | Temp 97.6°F | Ht 63.5 in | Wt 277.0 lb

## 2015-04-14 DIAGNOSIS — G629 Polyneuropathy, unspecified: Secondary | ICD-10-CM

## 2015-04-14 DIAGNOSIS — Z79899 Other long term (current) drug therapy: Secondary | ICD-10-CM | POA: Diagnosis not present

## 2015-04-14 DIAGNOSIS — M542 Cervicalgia: Secondary | ICD-10-CM | POA: Diagnosis not present

## 2015-04-14 LAB — POCT CBC
Granulocyte percent: 57.8 %G (ref 37–80)
HCT, POC: 37.5 % — AB (ref 37.7–47.9)
Hemoglobin: 12.2 g/dL (ref 12.2–16.2)
Lymph, poc: 4 — AB (ref 0.6–3.4)
MCH, POC: 29.2 pg (ref 27–31.2)
MCHC: 32.4 g/dL (ref 31.8–35.4)
MCV: 90.2 fL (ref 80–97)
MPV: 7.2 fL (ref 0–99.8)
POC Granulocyte: 6.5 (ref 2–6.9)
POC LYMPH PERCENT: 35.6 %L (ref 10–50)
Platelet Count, POC: 305 10*3/uL (ref 142–424)
RBC: 4.16 M/uL (ref 4.04–5.48)
RDW, POC: 13.8 %
WBC: 11.2 10*3/uL — AB (ref 4.6–10.2)

## 2015-04-14 NOTE — Patient Instructions (Signed)
Continue to take Naprosyn twice daily after breakfast and supper if needed. Otherwise substitute extra strength Tylenol. Use warm wet compresses to the neck 3 or 4 times daily for 20 minutes We will arrange to get an MRI at [facility] of the cervical spine We will also do a referral in a couple weeks to the neurosurgeon because of the persistent neck pain and neuropathy for over 6 weeks.
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
## 2015-04-15 LAB — BMP8+EGFR
BUN/Creatinine Ratio: 23 (ref 9–23)
BUN: 23 mg/dL (ref 6–24)
CO2: 23 mmol/L (ref 18–29)
Calcium: 9.2 mg/dL (ref 8.7–10.2)
Chloride: 97 mmol/L (ref 97–108)
Creatinine, Ser: 1.01 mg/dL — ABNORMAL HIGH (ref 0.57–1.00)
GFR calc non Af Amer: 66 mL/min/{1.73_m2} (ref >59)
GFR, EST AFRICAN AMERICAN: 76 mL/min/{1.73_m2} (ref >59)
GLUCOSE: 115 mg/dL — AB (ref 65–99)
POTASSIUM: 5 mmol/L (ref 3.5–5.2)
SODIUM: 139 mmol/L (ref 134–144)

## 2015-04-21 ENCOUNTER — Ambulatory Visit
Admission: RE | Admit: 2015-04-21 | Discharge: 2015-04-21 | Disposition: A | Discharge status: Home / Self Care | Payer: BLUE CROSS/BLUE SHIELD | Source: Ambulatory Visit | Attending: Family Medicine | Admitting: Family Medicine

## 2015-04-21 DIAGNOSIS — G629 Polyneuropathy, unspecified: Secondary | ICD-10-CM

## 2015-04-21 DIAGNOSIS — M542 Cervicalgia: Secondary | ICD-10-CM

## 2015-04-30 ENCOUNTER — Other Ambulatory Visit: Payer: Self-pay | Admitting: Family Medicine

## 2015-05-01 ENCOUNTER — Encounter: Payer: Self-pay | Admitting: Family Medicine

## 2015-05-01 ENCOUNTER — Ambulatory Visit (INDEPENDENT_AMBULATORY_CARE_PROVIDER_SITE_OTHER): Payer: BLUE CROSS/BLUE SHIELD | Admitting: Family Medicine

## 2015-05-01 VITALS — BP 99/66 | HR 70 | Temp 97.0°F | Ht 63.5 in | Wt 278.0 lb

## 2015-05-01 DIAGNOSIS — J209 Acute bronchitis, unspecified: Secondary | ICD-10-CM | POA: Diagnosis not present

## 2015-05-01 DIAGNOSIS — E1159 Type 2 diabetes mellitus with other circulatory complications: Secondary | ICD-10-CM

## 2015-05-01 DIAGNOSIS — M5030 Other cervical disc degeneration, unspecified cervical region: Secondary | ICD-10-CM

## 2015-05-01 DIAGNOSIS — J3489 Other specified disorders of nose and nasal sinuses: Secondary | ICD-10-CM | POA: Diagnosis not present

## 2015-05-01 DIAGNOSIS — D509 Iron deficiency anemia, unspecified: Secondary | ICD-10-CM | POA: Diagnosis not present

## 2015-05-01 DIAGNOSIS — E039 Hypothyroidism, unspecified: Secondary | ICD-10-CM

## 2015-05-01 DIAGNOSIS — K219 Gastro-esophageal reflux disease without esophagitis: Secondary | ICD-10-CM | POA: Diagnosis not present

## 2015-05-01 DIAGNOSIS — E119 Type 2 diabetes mellitus without complications: Secondary | ICD-10-CM

## 2015-05-01 DIAGNOSIS — E1169 Type 2 diabetes mellitus with other specified complication: Secondary | ICD-10-CM

## 2015-05-01 DIAGNOSIS — I10 Essential (primary) hypertension: Secondary | ICD-10-CM

## 2015-05-01 DIAGNOSIS — E559 Vitamin D deficiency, unspecified: Secondary | ICD-10-CM

## 2015-05-01 DIAGNOSIS — I152 Hypertension secondary to endocrine disorders: Secondary | ICD-10-CM

## 2015-05-01 LAB — POCT CBC
Granulocyte percent: 60.1 %G (ref 37–80)
HCT, POC: 38.4 % (ref 37.7–47.9)
Hemoglobin: 12.3 g/dL (ref 12.2–16.2)
Lymph, poc: 5 — AB (ref 0.6–3.4)
MCH, POC: 29 pg (ref 27–31.2)
MCHC: 32 g/dL (ref 31.8–35.4)
MCV: 90.6 fL (ref 80–97)
MPV: 7.6 fL (ref 0–99.8)
PLATELET COUNT, POC: 300 10*3/uL (ref 142–424)
POC Granulocyte: 8.8 — AB (ref 2–6.9)
POC LYMPH PERCENT: 34.4 %L (ref 10–50)
RBC: 4.23 M/uL (ref 4.04–5.48)
RDW, POC: 13.9 %
WBC: 14.6 10*3/uL — AB (ref 4.6–10.2)

## 2015-05-01 MED ORDER — METAXALONE 800 MG PO TABS: 800.0000 mg | ORAL_TABLET | Freq: Three times a day (TID) | ORAL | Status: DC

## 2015-05-01 MED ORDER — FLUTICASONE PROPIONATE 50 MCG/ACT NA SUSP: 1.0000 | Freq: Every day | NASAL | Status: DC

## 2015-05-01 MED ORDER — AZITHROMYCIN 250 MG PO TABS: ORAL_TABLET | ORAL | Status: DC

## 2015-05-01 MED ORDER — NAPROXEN 500 MG PO TABS: 500.0000 mg | ORAL_TABLET | Freq: Two times a day (BID) | ORAL | Status: DC

## 2015-05-01 NOTE — Patient Instructions (Addendum)
Continue current medications. Continue good therapeutic lifestyle changes which include good diet and exercise. Fall precautions discussed with patient. If an FOBT was given today- please return it to our front desk. If you are over 49 years old - you may need Prevnar 48 or the adult Pneumonia vaccine.  Flu Shots are still available at our office. If you still haven't had one please call to set up a nurse visit to get one.   After your visit with [place] today you will receive a survey in the mail or online from [employer] regarding your care with [place]. Please take a moment to fill this out. Your feedback is very important to [place] as you can help [place] better understand your patient needs as well as improve your experience and satisfaction. WE CARE ABOUT YOU!!!   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 She will follow-up with [place] in 3-4 weeks to make sure the neck pain is getting better. She should avoid heavy lifting pushing or pulling and try to prevent any further falling.
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
## 2015-05-02 LAB — BMP8+EGFR
BUN / CREAT RATIO: 24 — AB (ref 9–23)
BUN: 21 mg/dL (ref 6–24)
CO2: 21 mmol/L (ref 18–29)
Calcium: 10 mg/dL (ref 8.7–10.2)
Chloride: 98 mmol/L (ref 97–108)
Creatinine, Ser: 0.89 mg/dL (ref 0.57–1.00)
GFR, EST AFRICAN AMERICAN: 88 mL/min/{1.73_m2} (ref >59)
GFR, EST NON AFRICAN AMERICAN: 76 mL/min/{1.73_m2} (ref >59)
Glucose: 109 mg/dL — ABNORMAL HIGH (ref 65–99)
Potassium: 5 mmol/L (ref 3.5–5.2)
Sodium: 138 mmol/L (ref 134–144)

## 2015-05-09 ENCOUNTER — Ambulatory Visit: Payer: BLUE CROSS/BLUE SHIELD | Attending: Family Medicine

## 2015-05-09 DIAGNOSIS — M5380 Other specified dorsopathies, site unspecified: Secondary | ICD-10-CM | POA: Diagnosis not present

## 2015-05-09 DIAGNOSIS — M2560 Stiffness of unspecified joint, not elsewhere classified: Secondary | ICD-10-CM

## 2015-05-09 DIAGNOSIS — M542 Cervicalgia: Secondary | ICD-10-CM | POA: Diagnosis not present

## 2015-05-09 DIAGNOSIS — M546 Pain in thoracic spine: Secondary | ICD-10-CM | POA: Diagnosis not present

## 2015-05-09 DIAGNOSIS — R293 Abnormal posture: Secondary | ICD-10-CM | POA: Diagnosis not present
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
## 2015-05-09 NOTE — Patient Instructions (Signed)
Postural education  of decreasing load to spine with lumbar support and support of arms

## 2015-05-11 ENCOUNTER — Ambulatory Visit: Payer: BLUE CROSS/BLUE SHIELD | Admitting: Physical Therapy

## 2015-05-11 DIAGNOSIS — R293 Abnormal posture: Secondary | ICD-10-CM

## 2015-05-11 DIAGNOSIS — M542 Cervicalgia: Secondary | ICD-10-CM

## 2015-05-11 DIAGNOSIS — M546 Pain in thoracic spine: Secondary | ICD-10-CM

## 2015-05-11 DIAGNOSIS — M2560 Stiffness of unspecified joint, not elsewhere classified: Secondary | ICD-10-CM

## 2015-05-11 NOTE — Patient Instructions (Signed)
Trigger Point Dry Needling  . What is Trigger Point Dry Needling (DN)? o DN is a physical therapy technique used to treat muscle pain and dysfunction. Specifically, DN helps deactivate muscle trigger points (muscle knots).  o A thin filiform needle is used to penetrate the skin and stimulate the underlying trigger point. The goal is for a local twitch response (LTR) to occur and for the trigger point to relax. No medication of any kind is injected during the procedure.   . What Does Trigger Point Dry Needling Feel Like?  o The procedure feels different for each individual patient. Some patients report that they do not actually feel the needle enter the skin and overall the process is not painful. Very mild bleeding may occur. However, many patients feel a deep cramping in the muscle in which the needle was inserted. This is the local twitch response.   Marland Kitchen How Will I feel after the treatment? o Soreness is normal, and the onset of soreness may not occur for a few hours. Typically this soreness does not last longer than two days.  o Bruising is uncommon, however; ice can be used to decrease any possible bruising.  o In rare cases feeling tired or nauseous after the treatment is normal. In addition, your symptoms may get worse before they get better, this period will typically not last longer than 24 hours.   . What Can I do After My Treatment? o Increase your hydration by drinking more water for the next 24 hours. o You may place ice or heat on the areas treated that have become sore, however, do not use heat on inflamed or bruised areas. Heat often brings more relief post needling. o You can continue your regular activities, but vigorous activity is not recommended initially after the treatment for 24 hours. o DN is best combined with other physical therapy such as strengthening, stretching, and other therapies.   Levator Stretch   Grasp seat or sit on hand on side to be stretched. Turn head  toward other side and look down. Use hand on head to gently stretch neck in that position. Hold ___30_ seconds. Repeat on other side. In clinic, Holly Rodriguez showed you how to do this against wall. Look into left hip pocket against wall. For Right neck pain Repeat _2-3___ times. Do __2-3__ sessions per day.  http://gt2.exer.us/30   Copyright  VHI. All rights reserved.  Side-Bending   One hand on opposite side of head, pull head to side as far as is comfortable. Stop if there is pain. Hold ____ seconds. Repeat with other hand to other side. Repeat ____ times. Do ____ sessions per day.   Copyright  VHI. All rights reserved.  Scapular Retraction (Standing)   With arms at sides, pinch shoulder blades together. Repeat __10__ times per set. Do __1__ sets per session. Do 2-3____ sessions per day.  http://orth.exer.us/944   Copyright  VHI. All rights reserved.  Chin Protraction / Retraction   Slide head forward keeping chin level. Slide head back, pulling chin in. Hold each position _5__ seconds. Repeat _5__ times. Do _3_ sessions per day.  Posture Tips DO: - stand tall and erect - keep chin tucked in - keep head and shoulders in alignment - check posture regularly in mirror or large window - pull head back against headrest in car seat;  Change your position often.  Sit with lumbar support. DON'T: - slouch or slump while watching TV or reading - sit, stand or lie in one  position  for too long;  Sitting is especially hard on the spine so if you sit at a desk/use the computer, then stand up often!   Copyright  VHI. All rights reserved.  Posture - Standing   Good posture is important. Avoid slouching and forward head thrust. Maintain curve in low back and align ears over shoul- ders, hips over ankles.  Pull your belly button in toward your back bone. Chest string to sky and even weight in feet ( great toe, little toe and heel   Copyright  VHI. All rights reserved.  Posture -  Sitting   Sit upright, head facing forward. Try using a roll to support lower back. Keep shoulders relaxed, and avoid rounded back. Keep hips level with knees. Avoid crossing legs for long periods. Sit on Sit bones , not tail bone.   Copyright  VHI. All rights reserved.    Copyright  VHI. All rights reserved.   Holly Rodriguez, PT 05/11/2015 11:06 AM Phone: [phone] Fax: [phone]

## 2015-05-11 NOTE — Therapy (Signed)
[place], [place], [address] Phone: [phone]   Fax:  [phone]  Physical Therapy Treatment  Patient Details  Name: Holly Rodriguez MRN: [id number] Date of Birth: Nov 25, 1965 Referring Provider:  Adella Nissen, PA-C  Encounter Date: 05/11/2015      PT End of Session - 05/11/15 1151    Visit Number 1   Number of Visits 12   Date for PT Re-Evaluation 06/13/15   Authorization Type [employer]   Authorization - Visit Number 2   Authorization - Number of Visits 12   PT Start Time 1101   PT Stop Time 1203   PT Time Calculation (min) 62 min   Equipment Utilized During Treatment Cervical collar   Activity Tolerance Patient tolerated treatment well   Behavior During Therapy [facility] for tasks assessed/performed      Past Medical History  Diagnosis Date  . GERD (gastroesophageal reflux disease)   . Thyroid disease   . Anxiety   . IBS (irritable bowel syndrome)   . Sleep apnea   . Diabetes mellitus without complication     Past Surgical History  Procedure Laterality Date  . Cesarean section    . Tonsillectomy    . Gastric restriction surgery    . Tubal ligation    . Cholecystectomy    . Laparoscopic appendectomy N/A 10/25/2013    Procedure: APPENDECTOMY LAPAROSCOPIC;  Surgeon: Jamesetta So, MD;  Location: [facility];  Service: General;  Laterality: N/A;    There were no vitals filed for this visit.  Visit Diagnosis:  Joint stiffness of spine  Pain in neck  Pain in thoracic spine  Abnormal posture      Subjective Assessment - 05/11/15 1147    Currently in Pain? Yes   Pain Score 7    Pain Location Neck   Pain Orientation Right;Left;Posterior;Mid;Upper   Pain Descriptors / Indicators Sharp;Aching;Burning   Pain Onset More than a month ago            [facility] PT Assessment - 05/11/15 1136    AROM   Cervical Flexion 50   Cervical Extension 54   Cervical - Right Side Bend 58   Cervical - Left  Side Bend 56   Cervical - Right Rotation 63   Cervical - Left Rotation 60                     [facility] Adult PT Treatment/Exercise - 05/11/15 1144    Neck Exercises: Stretches   Upper Trapezius Stretch 2 reps;30 seconds   Levator Stretch 2 reps;30 seconds   Neck Stretch 5 reps;10 seconds  neck retraction   Other Neck Stretches shoulder rolls back x 10          Trigger Point Dry Needling - 05/11/15 1143    Consent Given? Yes   Education Handout Provided Yes   Muscles Treated Upper Body Upper trapezius;Levator scapulae;Rhomboids;Subscapularis  Thorcic paraspinal T-4 to T-8 Right side only   Upper Trapezius Response Twitch reponse elicited;Palpable increased muscle length  right side for all dry needling to day   Levator Scapulae Response Twitch response elicited;Palpable increased muscle length   Rhomboids Response Twitch response elicited;Palpable increased muscle length   Subscapularis Response Twitch response elicited;Palpable increased muscle length              PT Education - 05/11/15 1117    Education provided Yes   Education Details posture sitting and standing, Trigger point dry  needling precautians and after care and neck tension stretches   Person(s) Educated Patient   Methods Explanation;Demonstration;Handout;Verbal cues   Comprehension Verbalized understanding;Returned demonstration;Verbal cues required          PT Short Term Goals - 05/11/15 1148    PT SHORT TERM GOAL #1   Title Independent wiht inital HEP   Time 3   Period Weeks   Status On-going   PT SHORT TERM GOAL #2   Title Demonsrate awareness of good posture   Time 3   Period Weeks   Status On-going   PT SHORT TERM GOAL #3   Title She will report pain improved 30% or more with work tasks   Period Weeks   Status On-going           PT Long Term Goals - 05/11/15 1149    PT LONG TERM GOAL #1   Title she will e independent with all HEP issued as of last visi   Time 6    Period Weeks   Status On-going   PT LONG TERM GOAL #2   Title she will imprve thoracic rotaiton to 60 degrees RT and LT to ease pain   Time 6   Period Weeks   Status On-going   PT LONG TERM GOAL #3   Title She will report pain decreased 75% and become intermittant   Time 6   Period Weeks   Status On-going   PT LONG TERM GOAL #4   Title Her FOTO score will improve to 40% or less to demo decreased pain and improved funciton   Time 6   Period Weeks   Status On-going               Plan - 05/11/15 1146    Clinical Impression Statement Pt with very stiff and palpably tight levator /upper trap and rhomboids on Right side with decreased tissue extensibiliyty.  Pt with increased cervical flexion and lateral neck flexion after treatment.  No goals achieved due to limited visit only 2nd visit   Pt will benefit from skilled therapeutic intervention in order to improve on the following deficits Pain;Increased muscle spasms;Decreased range of motion;Postural dysfunction   Rehab Potential Good   PT Frequency 2x / week   PT Duration 6 weeks   PT Treatment/Interventions Moist Heat;Traction;Ultrasound;Cryotherapy;Taping;Manual techniques;Therapeutic exercise;Dry needling;Patient/family education;Passive range of motion   PT Next Visit Plan Assess effectiveness of Dry needling.  Review HEP and add deep neck flexor strength        Problem List Patient Active Problem List   Diagnosis Date Noted  . Morbid obesity 11/15/2014  . Colon polyps 08/03/2014  . Hemorrhoids, external 08/03/2014  . Prolapsed internal hemorrhoids, grade 1 08/03/2014  . Iron deficiency 06/22/2014  . Type 2 diabetes mellitus not at goal 12/06/2013  . Hypertension associated with diabetes 12/06/2013  . Acute appendicitis 10/25/2013    Voncille Lo, PT 05/11/2015 11:52 AM Phone: [phone] Fax: [facility] [place], [place],  [address] Phone: [phone]   Fax:  [phone]

## 2015-05-15 ENCOUNTER — Ambulatory Visit: Payer: BLUE CROSS/BLUE SHIELD

## 2015-05-15 DIAGNOSIS — R293 Abnormal posture: Secondary | ICD-10-CM

## 2015-05-15 DIAGNOSIS — M542 Cervicalgia: Secondary | ICD-10-CM | POA: Diagnosis not present

## 2015-05-15 DIAGNOSIS — M2560 Stiffness of unspecified joint, not elsewhere classified: Secondary | ICD-10-CM

## 2015-05-15 DIAGNOSIS — M546 Pain in thoracic spine: Secondary | ICD-10-CM

## 2015-05-15 NOTE — Therapy (Signed)
[place], [place], [address] Phone: [phone]   Fax:  [phone]  Physical Therapy Treatment  Patient Details  Name: CECILY LAWHORNE MRN: [id number] Date of Birth: June 29, 1966 Referring Provider:  Adella Nissen, PA-C  Encounter Date: 05/15/2015      PT End of Session - 05/15/15 1051    Visit Number 3   Number of Visits 12   Date for PT Re-Evaluation 06/13/15   PT Start Time 2694   PT Stop Time 1110   PT Time Calculation (min) 55 min   Activity Tolerance Patient tolerated treatment well   Behavior During Therapy [facility] for tasks assessed/performed      Past Medical History  Diagnosis Date  . GERD (gastroesophageal reflux disease)   . Thyroid disease   . Anxiety   . IBS (irritable bowel syndrome)   . Sleep apnea   . Diabetes mellitus without complication     Past Surgical History  Procedure Laterality Date  . Cesarean section    . Tonsillectomy    . Gastric restriction surgery    . Tubal ligation    . Cholecystectomy    . Laparoscopic appendectomy N/A 10/25/2013    Procedure: APPENDECTOMY LAPAROSCOPIC;  Surgeon: Jamesetta So, MD;  Location: [facility];  Service: General;  Laterality: N/A;    There were no vitals filed for this visit.  Visit Diagnosis:  Joint stiffness of spine  Pain in neck  Pain in thoracic spine  Abnormal posture      Subjective Assessment - 05/15/15 1014    Subjective Awsome. She feels better and burning reduced.    Currently in Pain? Yes   Pain Score 4    Pain Location Neck   Pain Orientation Right;Left;Posterior   Pain Descriptors / Indicators Aching;Burning   Pain Type --  subacute   Pain Onset More than a month ago   Pain Frequency Constant   Aggravating Factors  work/home task   Pain Relieving Factors needling , rest , med   Multiple Pain Sites No                         [facility] Adult PT Treatment/Exercise - 05/15/15 1018    Exercises   Exercises Neck   Neck Exercises: Supine   Neck Retraction 10 reps;5 secs   Neck Retraction Limitations plus one set of 5   Capital Flexion 10 reps   Capital Flexion Limitations plus on set 5  done with neck retraction gentle   Other Supine Exercise nod and retract  with grips x 15 5 sec and with arm rotation x10 5 sets.    Moist Heat Therapy   Number Minutes Moist Heat 15 Minutes   Moist Heat Location Cervical   Manual Therapy   Manual Therapy Soft tissue mobilization   Soft tissue mobilization STW to R upper trap, levator, rhomboid and subscapularis with rock blade and hands.                 PT Education - 05/15/15 1051    Education provided Yes   Education Details cervical stab exercises   Person(s) Educated Patient   Methods Explanation;Demonstration;Verbal cues;Handout   Comprehension Returned demonstration          PT Short Term Goals - 05/15/15 1053    PT SHORT TERM GOAL #1   Title Independent wiht inital HEP   Status On-going   PT SHORT TERM GOAL #  2   Title Demonsrate awareness of good posture   Status Achieved   PT SHORT TERM GOAL #3   Title She will report pain improved 30% or more with work tasks   Status On-going           PT Long Term Goals - 05/11/15 [place] #1   Title she will e independent with all HEP issued as of last visi   Time 6   Period Weeks   Status On-going   PT LONG TERM GOAL #2   Title she will imprve thoracic rotaiton to 60 degrees RT and LT to ease pain   Time 6   Period Weeks   Status On-going   PT LONG TERM GOAL #3   Title She will report pain decreased 75% and become intermittant   Time 6   Period Weeks   Status On-going   PT LONG TERM GOAL #4   Title Her FOTO score will improve to 40% or less to demo decreased pain and improved funciton   Time 6   Period Weeks   Status On-going               Plan - 05/15/15 1052    Clinical Impression Statement She is improved with pain  reduced and she felt bette post needling an duse of tool. She was able to do the stab exercises correctly   PT Next Visit Plan Contniue manual and needling and postureal strength stability( level 2 )   PT Home Exercise Plan stab exercise   Consulted and Agree with Plan of Care Patient        Problem List Patient Active Problem List   Diagnosis Date Noted  . Morbid obesity 11/15/2014  . Colon polyps 08/03/2014  . Hemorrhoids, external 08/03/2014  . Prolapsed internal hemorrhoids, grade 1 08/03/2014  . Iron deficiency 06/22/2014  . Type 2 diabetes mellitus not at goal 12/06/2013  . Hypertension associated with diabetes 12/06/2013  . Acute appendicitis 10/25/2013    Darrel Hoover PT 05/15/2015, 10:54 AM  [facility] [address] [place], [place], [address] Phone: [phone]   Fax:  [phone]

## 2015-05-15 NOTE — Patient Instructions (Signed)
Issued from cabinet Level 1 cervical stabilization exercises. 10-15 reps 1-3 sets daily. Cautioned to stop if painful

## 2015-05-16 ENCOUNTER — Other Ambulatory Visit: Payer: Self-pay

## 2015-05-16 ENCOUNTER — Other Ambulatory Visit: Payer: Self-pay | Admitting: Family Medicine

## 2015-05-16 MED ORDER — OMEPRAZOLE 40 MG PO CPDR: 40.0000 mg | DELAYED_RELEASE_CAPSULE | Freq: Every day | ORAL | Status: DC

## 2015-05-17 ENCOUNTER — Telehealth: Payer: Self-pay | Admitting: Hematology

## 2015-05-17 NOTE — Telephone Encounter (Signed)
Returned call to patient re cancelling 7/13 appointments due to accident. Spoke with patient confirming cancellation and patient will call back to reschedule.

## 2015-05-18 ENCOUNTER — Other Ambulatory Visit: Payer: Self-pay

## 2015-05-18 ENCOUNTER — Ambulatory Visit: Payer: BLUE CROSS/BLUE SHIELD

## 2015-05-18 DIAGNOSIS — M2560 Stiffness of unspecified joint, not elsewhere classified: Secondary | ICD-10-CM

## 2015-05-18 DIAGNOSIS — R293 Abnormal posture: Secondary | ICD-10-CM

## 2015-05-18 DIAGNOSIS — M542 Cervicalgia: Secondary | ICD-10-CM | POA: Diagnosis not present

## 2015-05-18 MED ORDER — OMEPRAZOLE 40 MG PO CPDR: 40.0000 mg | DELAYED_RELEASE_CAPSULE | Freq: Every day | ORAL | Status: DC

## 2015-05-18 NOTE — Therapy (Signed)
[place], [place], [address] Phone: [phone]   Fax:  [phone]  Physical Therapy Treatment  Patient Details  Name: Holly Rodriguez MRN: [id number] Date of Birth: 23-Feb-1966 Referring Provider:  Adella Nissen, PA-C  Encounter Date: 05/18/2015      PT End of Session - 05/18/15 1052    Visit Number 4   Number of Visits 12   Date for PT Re-Evaluation 06/13/15   PT Start Time 1018   PT Stop Time 1100   PT Time Calculation (min) 42 min   Activity Tolerance Patient tolerated treatment well   Behavior During Therapy [facility] for tasks assessed/performed      Past Medical History  Diagnosis Date  . GERD (gastroesophageal reflux disease)   . Thyroid disease   . Anxiety   . IBS (irritable bowel syndrome)   . Sleep apnea   . Diabetes mellitus without complication     Past Surgical History  Procedure Laterality Date  . Cesarean section    . Tonsillectomy    . Gastric restriction surgery    . Tubal ligation    . Cholecystectomy    . Laparoscopic appendectomy N/A 10/25/2013    Procedure: APPENDECTOMY LAPAROSCOPIC;  Surgeon: Jamesetta So, MD;  Location: [facility];  Service: General;  Laterality: N/A;    There were no vitals filed for this visit.  Visit Diagnosis:  Joint stiffness of spine  Pain in neck  Abnormal posture      Subjective Assessment - 05/18/15 1047    Subjective More pain . I turned my head to Lt and felt pain .  May have slept wrong also   Currently in Pain? Yes   Pain Score 5    Pain Location Neck  levator area   Pain Orientation Right   Pain Descriptors / Indicators Aching;Burning   Pain Type --  subacute   Pain Onset More than a month ago   Pain Frequency Constant   Aggravating Factors  work and home tasks   Pain Relieving Factors PT    Multiple Pain Sites No                         [facility] Adult PT Treatment/Exercise - 05/18/15 1049    Neck Exercises:  Machines for Strengthening   UBE (Upper Arm Bike) 120 RPM 5 min  no incraxed pain   Neck Exercises: Seated   Other Seated Exercise Stretcuing RT lavatorand scalenes. 15sec x 4 with gentle overpressure   Moist Heat Therapy   Number Minutes Moist Heat 15 Minutes   Moist Heat Location Cervical   Manual Therapy   Manual therapy comments kineseotape ove levator RT inhibition   Soft tissue mobilization STW to R upper trap, levator, rhomboid and subscapularis with rock blade and hands. emphasis on levetor on RT.                   PT Short Term Goals - 05/15/15 1053    PT SHORT TERM GOAL #1   Title Independent wiht inital HEP   Status On-going   PT SHORT TERM GOAL #2   Title Demonsrate awareness of good posture   Status Achieved   PT SHORT TERM GOAL #3   Title She will report pain improved 30% or more with work tasks   Status On-going           PT Long Term Goals - 05/11/15 1149  PT LONG TERM GOAL #1   Title she will e independent with all HEP issued as of last visi   Time 6   Period Weeks   Status On-going   PT LONG TERM GOAL #2   Title she will imprve thoracic rotaiton to 60 degrees RT and LT to ease pain   Time 6   Period Weeks   Status On-going   PT LONG TERM GOAL #3   Title She will report pain decreased 75% and become intermittant   Time 6   Period Weeks   Status On-going   PT LONG TERM GOAL #4   Title Her FOTO score will improve to 40% or less to demo decreased pain and improved funciton   Time 6   Period Weeks   Status On-going               Plan - 05/18/15 1053    Clinical Impression Statement [place] appears strain of RT levator. Encourage stretch and heat at home and will resume exercies next visit and or needling   PT Next Visit Plan Contniue manual and needling and postureal strength stability( level 2 )   Consulted and Agree with Plan of Care Patient        Problem List Patient Active Problem List   Diagnosis Date Noted  .  Morbid obesity 11/15/2014  . Colon polyps 08/03/2014  . Hemorrhoids, external 08/03/2014  . Prolapsed internal hemorrhoids, grade 1 08/03/2014  . Iron deficiency 06/22/2014  . Type 2 diabetes mellitus not at goal 12/06/2013  . Hypertension associated with diabetes 12/06/2013  . Acute appendicitis 10/25/2013    Darrel Hoover PT 05/18/2015, 10:57 AM  [facility] [address] [place], [place], [address] Phone: [phone]   Fax:  [phone]

## 2015-05-18 NOTE — Patient Instructions (Addendum)
Encouraged pt to strech regular basis 6-8x/day gentle 15 sec 2 reps per time. Pt to remove kineseotape in 3 days or before if bothering her.

## 2015-05-24 ENCOUNTER — Ambulatory Visit: Payer: BLUE CROSS/BLUE SHIELD | Attending: Family Medicine

## 2015-05-24 ENCOUNTER — Telehealth: Payer: Self-pay | Admitting: Family Medicine

## 2015-05-24 DIAGNOSIS — M542 Cervicalgia: Secondary | ICD-10-CM | POA: Insufficient documentation

## 2015-05-24 DIAGNOSIS — M546 Pain in thoracic spine: Secondary | ICD-10-CM | POA: Insufficient documentation

## 2015-05-24 DIAGNOSIS — M2560 Stiffness of unspecified joint, not elsewhere classified: Secondary | ICD-10-CM

## 2015-05-24 DIAGNOSIS — R293 Abnormal posture: Secondary | ICD-10-CM | POA: Insufficient documentation

## 2015-05-24 NOTE — Therapy (Signed)
[place], [place], [address] Phone: [phone]   Fax:  [phone]  Physical Therapy Treatment  Patient Details  Name: Holly Rodriguez MRN: [id number] Date of Birth: 1966-10-06 Referring Provider:  Adella Nissen, PA-C  Encounter Date: 05/24/2015      PT End of Session - 05/24/15 0838    Visit Number 5   Number of Visits 12   Date for PT Re-Evaluation 06/13/15   PT Start Time 0800   PT Stop Time 0845   PT Time Calculation (min) 45 min   Activity Tolerance Patient tolerated treatment well   Behavior During Therapy [facility] for tasks assessed/performed      Past Medical History  Diagnosis Date  . GERD (gastroesophageal reflux disease)   . Thyroid disease   . Anxiety   . IBS (irritable bowel syndrome)   . Sleep apnea   . Diabetes mellitus without complication     Past Surgical History  Procedure Laterality Date  . Cesarean section    . Tonsillectomy    . Gastric restriction surgery    . Tubal ligation    . Cholecystectomy    . Laparoscopic appendectomy N/A 10/25/2013    Procedure: APPENDECTOMY LAPAROSCOPIC;  Surgeon: Jamesetta So, MD;  Location: [facility];  Service: General;  Laterality: N/A;    There were no vitals filed for this visit.  Visit Diagnosis:  Pain in neck  Joint stiffness of spine      Subjective Assessment - 05/24/15 0801    Subjective Doing better with pulling feeling on Rt neck.   Currently in Pain? Yes   Pain Score 1    Pain Location Neck   Pain Orientation Right   Pain Type --  subacute   Pain Onset More than a month ago   Pain Frequency Intermittent   Multiple Pain Sites No                         [facility] Adult PT Treatment/Exercise - 05/24/15 0802    Modalities   Modalities Ultrasound   Moist Heat Therapy   Number Minutes Moist Heat 15 Minutes   Moist Heat Location Cervical   Ultrasound   Ultrasound Location RT neck at levator   Ultrasound  Parameters 100% 1 MHZ 1.5 Wcm2   Ultrasound Goals Pain   Manual Therapy   Manual therapy comments kineseotape over levator RT inhibition   Soft tissue mobilization STW to R upper trap, levator, rhomboid and subscapularis with rock blade and hands. emphasis on levetor on RT.                   PT Short Term Goals - 05/15/15 1053    PT SHORT TERM GOAL #1   Title Independent wiht inital HEP   Status On-going   PT SHORT TERM GOAL #2   Title Demonsrate awareness of good posture   Status Achieved   PT SHORT TERM GOAL #3   Title She will report pain improved 30% or more with work tasks   Status On-going           PT Long Term Goals - 05/11/15 1149    PT LONG TERM GOAL #1   Title she will e independent with all HEP issued as of last visi   Time 6   Period Weeks   Status On-going   PT LONG TERM GOAL #2   Title she will imprve thoracic  rotaiton to 60 degrees RT and LT to ease pain   Time 6   Period Weeks   Status On-going   PT LONG TERM GOAL #3   Title She will report pain decreased 75% and become intermittant   Time 6   Period Weeks   Status On-going   PT LONG TERM GOAL #4   Title Her FOTO score will improve to 40% or less to demo decreased pain and improved funciton   Time 6   Period Weeks   Status On-going               Plan - 05/24/15 0841    Clinical Impression Statement Much improved She reports feeling bettrer after treatment   PT Next Visit Plan Contniue manual and needling and postureal strength stability( level 2 )   Consulted and Agree with Plan of Care Patient        Problem List Patient Active Problem List   Diagnosis Date Noted  . Morbid obesity 11/15/2014  . Colon polyps 08/03/2014  . Hemorrhoids, external 08/03/2014  . Prolapsed internal hemorrhoids, grade 1 08/03/2014  . Iron deficiency 06/22/2014  . Type 2 diabetes mellitus not at goal 12/06/2013  . Hypertension associated with diabetes 12/06/2013  . Acute appendicitis  10/25/2013    Darrel Hoover PT 05/24/2015, 8:42 AM  [facility] [address] [place], [place], [address] Phone: [phone]   Fax:  [phone]

## 2015-05-25 ENCOUNTER — Ambulatory Visit: Payer: BLUE CROSS/BLUE SHIELD | Admitting: Physical Therapy

## 2015-05-25 DIAGNOSIS — R293 Abnormal posture: Secondary | ICD-10-CM

## 2015-05-25 DIAGNOSIS — M2560 Stiffness of unspecified joint, not elsewhere classified: Secondary | ICD-10-CM

## 2015-05-25 DIAGNOSIS — M542 Cervicalgia: Secondary | ICD-10-CM | POA: Diagnosis not present

## 2015-05-25 DIAGNOSIS — M546 Pain in thoracic spine: Secondary | ICD-10-CM

## 2015-05-25 NOTE — Telephone Encounter (Signed)
Pt aware appt moved

## 2015-05-25 NOTE — Therapy (Signed)
[place], [place], [address] Phone: [phone]   Fax:  [phone]  Physical Therapy Treatment  Patient Details  Name: Holly Rodriguez MRN: [id number] Date of Birth: 1966-09-29 Referring Provider:  Adella Nissen, PA-C  Encounter Date: 05/25/2015      PT End of Session - 05/25/15 1734    Visit Number 6   Number of Visits 12   Date for PT Re-Evaluation 06/13/15   Authorization Type [employer]   PT Start Time 1545   PT Stop Time 1642   PT Time Calculation (min) 57 min   Activity Tolerance Patient tolerated treatment well      Past Medical History  Diagnosis Date  . GERD (gastroesophageal reflux disease)   . Thyroid disease   . Anxiety   . IBS (irritable bowel syndrome)   . Sleep apnea   . Diabetes mellitus without complication     Past Surgical History  Procedure Laterality Date  . Cesarean section    . Tonsillectomy    . Gastric restriction surgery    . Tubal ligation    . Cholecystectomy    . Laparoscopic appendectomy N/A 10/25/2013    Procedure: APPENDECTOMY LAPAROSCOPIC;  Surgeon: Jamesetta So, MD;  Location: [facility];  Service: General;  Laterality: N/A;    There were no vitals filed for this visit.  Visit Diagnosis:  Pain in neck  Joint stiffness of spine  Abnormal posture  Pain in thoracic spine      Subjective Assessment - 05/25/15 1547    Subjective I thought the needling helped more than anything else.  Right upper trap pulls but not burning.  Intermittent rather than constant now.   Pain Score 4    Pain Location Neck   Pain Orientation Right   Pain Type Chronic pain   Pain Onset More than a month ago   Pain Frequency Intermittent   Aggravating Factors  slouching            [facility] PT Assessment - 05/25/15 0001    AROM   Cervical Flexion 53   Cervical Extension 55   Cervical - Right Side Bend 43   Cervical - Left Side Bend 50   Cervical - Right Rotation 40   Cervical - Left Rotation 40                     [facility] Adult PT Treatment/Exercise - 05/25/15 1552    Shoulder Exercises: Standing   External Rotation Strengthening;Both;20 reps;Theraband   Theraband Level (Shoulder External Rotation) Level 2 (Red)   Extension Strengthening;Both;Theraband   Theraband Level (Shoulder Extension) Level 2 (Red)   Row Strengthening;Both;20 reps;Theraband   Theraband Level (Shoulder Row) Level 2 (Red)   Moist Heat Therapy   Number Minutes Moist Heat 10 Minutes   Moist Heat Location Cervical   Manual Therapy   Manual Therapy Soft tissue mobilization;Manual Traction;Scapular mobilization   Manual therapy comments suboccipital release 3 x 20sec   Joint Mobilization contract-relax right upper traps   Soft tissue mobilization STW to R upper trap, levator, rhomboid and subscapularis with rock blade and hands. emphasis on levetor on RT.    Scapular Mobilization grade 3 scapular distraction   Manual Traction 3x 20 sec          Trigger Point Dry Needling - 05/25/15 1731    Consent Given? Yes   Muscles Treated Upper Body Upper trapezius;Levator scapulae;Rhomboids;Subscapularis  suboccipitals, right cervical multifidi  Upper Trapezius Response Palpable increased muscle length   Levator Scapulae Response Palpable increased muscle length   Rhomboids Response Palpable increased muscle length   Subscapularis Response Palpable increased muscle length       Right side only.       PT Education - 05/25/15 1733    Education provided Yes   Education Details red band rows, extensions, ER   Person(s) Educated Patient   Methods Explanation;Demonstration   Comprehension Verbalized understanding;Returned demonstration          PT Short Term Goals - 05/25/15 1738    PT SHORT TERM GOAL #1   Title Independent wiht inital HEP   Time 3   Period Weeks   Status Achieved   PT SHORT TERM GOAL #2   Title Demonsrate awareness of good posture   Status  Achieved   PT SHORT TERM GOAL #3   Title She will report pain improved 30% or more with work tasks   Status Achieved           PT Long Term Goals - 05/25/15 1739    PT LONG TERM GOAL #1   Title she will e independent with all HEP issued as of last visi   Time 6   Period Weeks   Status On-going   PT LONG TERM GOAL #2   Title she will imprve thoracic rotaiton to 60 degrees RT and LT to ease pain   Time 6   Period Weeks   Status On-going   PT LONG TERM GOAL #3   Title She will report pain decreased 75% and become intermittant   Time 6   Period Weeks   Status On-going   PT LONG TERM GOAL #4   Title Her FOTO score will improve to 40% or less to demo decreased pain and improved funciton   Time 6   Period Weeks   Status On-going               Plan - 05/25/15 1735    Clinical Impression Statement All short term goals met.  Patient with improvements in cervical flexion and extension although sidebending and rotation still limited bilaterally.  Pain intensity decreasing and now intermittent rather than constant.  No excacerbation of pain with scapular muscle strengthening with red band.  Patient reports good response from previous dry needling with fewer trigger points noted today.     PT Next Visit Plan Continue manual interventions, needling and postural strength stability( level 2 ); deep cervical flexor strengthening        Problem List Patient Active Problem List   Diagnosis Date Noted  . Morbid obesity 11/15/2014  . Colon polyps 08/03/2014  . Hemorrhoids, external 08/03/2014  . Prolapsed internal hemorrhoids, grade 1 08/03/2014  . Iron deficiency 06/22/2014  . Type 2 diabetes mellitus not at goal 12/06/2013  . Hypertension associated with diabetes 12/06/2013  . Acute appendicitis 10/25/2013    Simpson, Stacy C 05/25/2015, 5:41 PM  [facility] [address] [place], [place], [address] Phone: [phone]    Fax:  [phone]   Stacy Simpson, PT 05/25/2015 5:41 PM Phone: [phone] Fax: [phone]   

## 2015-05-29 ENCOUNTER — Ambulatory Visit: Payer: BLUE CROSS/BLUE SHIELD | Admitting: Physical Therapy

## 2015-05-29 DIAGNOSIS — M546 Pain in thoracic spine: Secondary | ICD-10-CM

## 2015-05-29 DIAGNOSIS — M542 Cervicalgia: Secondary | ICD-10-CM

## 2015-05-29 DIAGNOSIS — M2560 Stiffness of unspecified joint, not elsewhere classified: Secondary | ICD-10-CM

## 2015-05-29 DIAGNOSIS — R293 Abnormal posture: Secondary | ICD-10-CM

## 2015-05-29 NOTE — Therapy (Signed)
[place], [place], [address] Phone: [phone]   Fax:  [phone]  Physical Therapy Treatment  Patient Details  Name: Holly Rodriguez MRN: [id number] Date of Birth: [birth year] Referring Provider:  Adella Nissen, PA-C  Encounter Date: 05/29/2015      PT End of Session - 05/29/15 1552    Visit Number 7   Number of Visits 12   Date for PT Re-Evaluation 06/13/15   PT Start Time 0350   PT Stop Time 0443   PT Time Calculation (min) 53 min      Past Medical History  Diagnosis Date  . GERD (gastroesophageal reflux disease)   . Thyroid disease   . Anxiety   . IBS (irritable bowel syndrome)   . Sleep apnea   . Diabetes mellitus without complication     Past Surgical History  Procedure Laterality Date  . Cesarean section    . Tonsillectomy    . Gastric restriction surgery    . Tubal ligation    . Cholecystectomy    . Laparoscopic appendectomy N/A 10/25/2013    Procedure: APPENDECTOMY LAPAROSCOPIC;  Surgeon: Jamesetta So, MD;  Location: [facility];  Service: General;  Laterality: N/A;    There were no vitals filed for this visit.  Visit Diagnosis:  Joint stiffness of spine  Abnormal posture  Pain in thoracic spine  Pain in neck      Subjective Assessment - 05/29/15 1551    Subjective The burning is gone. Have intermittent pulling pain with end range movements or move a certain way.    Currently in Pain? No/denies            [facility] [place] PT Assessment - 05/29/15 0001    AROM   Cervical - Right Rotation 75   Cervical - Left Rotation 70                     [facility] Adult PT Treatment/Exercise - 05/29/15 1558    Shoulder Exercises: Standing   External Rotation Strengthening;Both;20 reps;Theraband   Theraband Level (Shoulder External Rotation) Level 2 (Red)   Extension Strengthening;Both;20 reps;Theraband   Theraband Level (Shoulder Extension) Level 2 (Red)   Row  Strengthening;Both;20 reps;Theraband   Theraband Level (Shoulder Row) Level 2 (Red)   Other Standing Exercises standing with back to foam roller- chin tuck with horizontal abdct x 10, ER x10, diagonals alternating x 10, pullover x 10 all with red band   Moist Heat Therapy   Number Minutes Moist Heat 15 Minutes   Moist Heat Location Cervical   Manual Therapy   Manual Therapy Soft tissue mobilization;Passive ROM   Soft tissue mobilization STW to R upper trap, levator, rhomboid and levetors                  PT Short Term Goals - 05/25/15 1738    PT SHORT TERM GOAL #1   Title Independent wiht inital HEP   Time 3   Period Weeks   Status Achieved   PT SHORT TERM GOAL #2   Title Demonsrate awareness of good posture   Status Achieved   PT SHORT TERM GOAL #3   Title She will report pain improved 30% or more with work tasks   Status Achieved           PT Long Term Goals - 05/29/15 1618    PT LONG TERM GOAL #1   Title she will e independent with all  HEP issued as of last visi   Time 6   Period Weeks   Status On-going   PT LONG TERM GOAL #2   Title she will imprve thoracic rotaiton to 60 degrees RT and LT to ease pain   Time 6   Period Weeks   Status On-going   PT LONG TERM GOAL #3   Title She will report pain decreased 75% and become intermittant   Time 6   Period Weeks   Status Achieved   PT LONG TERM GOAL #4   Title Her FOTO score will improve to 40% or less to demo decreased pain and improved funciton   Time 6   Period Weeks   Status On-going               Plan - 05/29/15 1553    Clinical Impression Statement Pt reports 75% decrease in pain and intermittent now. LTG#3 MET.    PT Next Visit Plan Continue manual interventions, needling and postural strength stability( level 2 ); deep cervical flexor strengthening        Problem List Patient Active Problem List   Diagnosis Date Noted  . Morbid obesity 11/15/2014  . Colon polyps 08/03/2014  .  Hemorrhoids, external 08/03/2014  . Prolapsed internal hemorrhoids, grade 1 08/03/2014  . Iron deficiency 06/22/2014  . Type 2 diabetes mellitus not at goal 12/06/2013  . Hypertension associated with diabetes 12/06/2013  . Acute appendicitis 10/25/2013    Dorene Ar, PTA 05/29/2015, 4:35 PM  [place], [place], [address] Phone: [phone]   Fax:  [phone]

## 2015-05-30 ENCOUNTER — Ambulatory Visit: Payer: BLUE CROSS/BLUE SHIELD | Admitting: Family Medicine

## 2015-05-31 ENCOUNTER — Ambulatory Visit: Payer: BLUE CROSS/BLUE SHIELD | Admitting: Hematology

## 2015-05-31 ENCOUNTER — Other Ambulatory Visit: Payer: BLUE CROSS/BLUE SHIELD

## 2015-06-01 ENCOUNTER — Ambulatory Visit: Payer: BLUE CROSS/BLUE SHIELD | Admitting: Physical Therapy

## 2015-06-01 DIAGNOSIS — M2560 Stiffness of unspecified joint, not elsewhere classified: Secondary | ICD-10-CM

## 2015-06-01 DIAGNOSIS — M542 Cervicalgia: Secondary | ICD-10-CM | POA: Diagnosis not present

## 2015-06-01 DIAGNOSIS — R293 Abnormal posture: Secondary | ICD-10-CM

## 2015-06-01 DIAGNOSIS — M546 Pain in thoracic spine: Secondary | ICD-10-CM

## 2015-06-01 NOTE — Patient Instructions (Signed)
Patient instructed in neck supine stabilization part 2 handout, levels 1, 2 and 3 (chin tucks with arm circles, horizontal abd and alternating opp shoulder flex/ext)

## 2015-06-01 NOTE — Therapy (Cosign Needed)
[place], [place], [address] Phone: [phone]   Fax:  [phone]  Physical Therapy Treatment  Patient Details  Name: Holly Rodriguez MRN: [id number] Date of Birth: August 20, 1966 Referring Provider:  Adella Nissen, PA-C  Encounter Date: 06/01/2015      PT End of Session - 06/01/15 1723    Visit Number 8   Number of Visits 12   Date for PT Re-Evaluation 06/13/15   PT Start Time 0347   PT Stop Time 0430   PT Time Calculation (min) 43 min   Activity Tolerance Patient limited by pain   Behavior During Therapy [facility] for tasks assessed/performed      Past Medical History  Diagnosis Date  . GERD (gastroesophageal reflux disease)   . Thyroid disease   . Anxiety   . IBS (irritable bowel syndrome)   . Sleep apnea   . Diabetes mellitus without complication     Past Surgical History  Procedure Laterality Date  . Cesarean section    . Tonsillectomy    . Gastric restriction surgery    . Tubal ligation    . Cholecystectomy    . Laparoscopic appendectomy N/A 10/25/2013    Procedure: APPENDECTOMY LAPAROSCOPIC;  Surgeon: Jamesetta So, MD;  Location: [facility];  Service: General;  Laterality: N/A;    There were no vitals filed for this visit.  Visit Diagnosis:  Joint stiffness of spine  Abnormal posture  Pain in thoracic spine  Pain in neck      Subjective Assessment - 06/01/15 1553    Subjective not feeling too bad; still only pain with certain movements; feels like she has a cramp in back of R shoulder/near shoulder blade   Currently in Pain? No/denies   Multiple Pain Sites No                         [facility] Adult PT Treatment/Exercise - 06/01/15 1714    Neck Exercises: Theraband   Shoulder Extension 15 reps;Red   Rows 15 reps;Red   Shoulder External Rotation 15 reps;Red   Neck Exercises: Seated   Neck Retraction 10 reps;3 secs   Neck Exercises: Supine   Other Supine Exercise  Neck: supine stabilization 2 handout (level 1, 2, 3) 10 reps, 2 sets   Shoulder Exercises: Seated   Retraction AROM;Strengthening;Both;15 reps   Shoulder Exercises: Standing   Other Standing Exercises standing with back to foam roller- chin tuck with horizontal abdct x 15, ER x15, diagonals alternating x 15, pullover x 15 all with red band   Manual Therapy   Manual Therapy Soft tissue mobilization   Soft tissue mobilization focus on R levator and trap with BIL suboccipital release   Neck Exercises: Stretches   Upper Trapezius Stretch 3 reps;20 seconds   Levator Stretch 3 reps;20 seconds                PT Education - 06/01/15 1723    Education provided Yes   Education Details neck supine stabilization part 2 - see HEP    Person(s) Educated Patient   Methods Explanation;Demonstration;Handout   Comprehension Verbalized understanding;Returned demonstration          PT Short Term Goals - 05/25/15 1738    PT SHORT TERM GOAL #1   Title Independent wiht inital HEP   Time 3   Period Weeks   Status Achieved   PT SHORT TERM GOAL #2   Title  Demonsrate awareness of good posture   Status Achieved   PT SHORT TERM GOAL #3   Title She will report pain improved 30% or more with work tasks   Status Achieved           PT Long Term Goals - 05/29/15 1618    PT LONG TERM GOAL #1   Title she will e independent with all HEP issued as of last visi   Time 6   Period Weeks   Status On-going   PT LONG TERM GOAL #2   Title she will imprve thoracic rotaiton to 60 degrees RT and LT to ease pain   Time 6   Period Weeks   Status On-going   PT LONG TERM GOAL #3   Title She will report pain decreased 75% and become intermittant   Time 6   Period Weeks   Status Achieved   PT LONG TERM GOAL #4   Title Her FOTO score will improve to 40% or less to demo decreased pain and improved funciton   Time 6   Period Weeks   Status On-going               Plan - 06/01/15 1725     Clinical Impression Statement patient continues to progress reps with exercises and feeling less pain    PT Next Visit Plan assess new exercises added to HEP; measure thoracic rotation; thoracic stretches; continue with STM; progress to green band if no pain increase from today    Consulted and Agree with Plan of Care Patient        Problem List Patient Active Problem List   Diagnosis Date Noted  . Morbid obesity 11/15/2014  . Colon polyps 08/03/2014  . Hemorrhoids, external 08/03/2014  . Prolapsed internal hemorrhoids, grade 1 08/03/2014  . Iron deficiency 06/22/2014  . Type 2 diabetes mellitus not at goal 12/06/2013  . Hypertension associated with diabetes 12/06/2013  . Acute appendicitis 10/25/2013    Denna Haggard, [place]  06/01/2015 5:29 PM  Phone: [phone]  Fax: [facility] [place], [place], [address] Phone: [phone]   Fax:  [phone]

## 2015-06-03 ENCOUNTER — Other Ambulatory Visit (INDEPENDENT_AMBULATORY_CARE_PROVIDER_SITE_OTHER): Payer: BLUE CROSS/BLUE SHIELD

## 2015-06-03 DIAGNOSIS — E1169 Type 2 diabetes mellitus with other specified complication: Secondary | ICD-10-CM | POA: Diagnosis not present

## 2015-06-03 DIAGNOSIS — I10 Essential (primary) hypertension: Secondary | ICD-10-CM

## 2015-06-03 LAB — POCT GLYCOSYLATED HEMOGLOBIN (HGB A1C): Hemoglobin A1C: 6.9

## 2015-06-03 NOTE — Addendum Note (Signed)
Addended by: Pollyann Kennedy F on: 06/03/2015 10:43 AM   Modules accepted: Orders

## 2015-06-03 NOTE — Addendum Note (Signed)
Addended by: Pollyann Kennedy F on: 06/03/2015 10:45 AM   Modules accepted: Orders

## 2015-06-03 NOTE — Addendum Note (Signed)
Addended by: Pollyann Kennedy F on: 06/03/2015 10:59 AM   Modules accepted: Orders

## 2015-06-05 ENCOUNTER — Ambulatory Visit: Payer: BLUE CROSS/BLUE SHIELD | Admitting: Physical Therapy

## 2015-06-05 DIAGNOSIS — M542 Cervicalgia: Secondary | ICD-10-CM

## 2015-06-05 DIAGNOSIS — M2560 Stiffness of unspecified joint, not elsewhere classified: Secondary | ICD-10-CM

## 2015-06-05 DIAGNOSIS — R293 Abnormal posture: Secondary | ICD-10-CM

## 2015-06-05 DIAGNOSIS — M546 Pain in thoracic spine: Secondary | ICD-10-CM

## 2015-06-05 LAB — CBC WITH DIFFERENTIAL/PLATELET
Basophils Absolute: 0.1 10*3/uL (ref 0.0–0.2)
Basos: 1 %
EOS (ABSOLUTE): 0.4 10*3/uL (ref 0.0–0.4)
Eos: 4 %
Hematocrit: 37.1 % (ref 34.0–46.6)
Hemoglobin: 12.3 g/dL (ref 11.1–15.9)
Immature Grans (Abs): 0 10*3/uL (ref 0.0–0.1)
Immature Granulocytes: 0 %
LYMPHS ABS: 3.4 10*3/uL — AB (ref 0.7–3.1)
Lymphs: 33 %
MCH: 30.6 pg (ref 26.6–33.0)
MCHC: 33.2 g/dL (ref 31.5–35.7)
MCV: 92 fL (ref 79–97)
MONOCYTES: 6 %
Monocytes Absolute: 0.6 10*3/uL (ref 0.1–0.9)
Neutrophils Absolute: 5.7 10*3/uL (ref 1.4–7.0)
Neutrophils: 56 %
Platelets: 273 10*3/uL (ref 150–379)
RBC: 4.02 x10E6/uL (ref 3.77–5.28)
RDW: 14.2 % (ref 12.3–15.4)
WBC: 10.1 10*3/uL (ref 3.4–10.8)

## 2015-06-05 LAB — LIPID PANEL
Chol/HDL Ratio: 6 ratio units — ABNORMAL HIGH (ref 0.0–4.4)
Cholesterol, Total: 191 mg/dL (ref 100–199)
HDL: 32 mg/dL — AB (ref >39)
LDL Calculated: 91 mg/dL (ref 0–99)
TRIGLYCERIDES: 338 mg/dL — AB (ref 0–149)
VLDL CHOLESTEROL CAL: 68 mg/dL — AB (ref 5–40)

## 2015-06-05 LAB — VITAMIN D 25 HYDROXY (VIT D DEFICIENCY, FRACTURES): VIT D 25 HYDROXY: 24.1 ng/mL — AB (ref 30.0–100.0)

## 2015-06-05 LAB — BMP8+EGFR
BUN/Creatinine Ratio: 19 (ref 9–23)
BUN: 18 mg/dL (ref 6–24)
CO2: 19 mmol/L (ref 18–29)
Calcium: 9.2 mg/dL (ref 8.7–10.2)
Chloride: 98 mmol/L (ref 97–108)
Creatinine, Ser: 0.97 mg/dL (ref 0.57–1.00)
GFR calc Af Amer: 79 mL/min/{1.73_m2} (ref >59)
GFR calc non Af Amer: 69 mL/min/{1.73_m2} (ref >59)
Glucose: 121 mg/dL — ABNORMAL HIGH (ref 65–99)
Potassium: 4.7 mmol/L (ref 3.5–5.2)
Sodium: 138 mmol/L (ref 134–144)

## 2015-06-05 LAB — HEPATIC FUNCTION PANEL
ALT: 22 IU/L (ref 0–32)
AST: 21 IU/L (ref 0–40)
Albumin: 4.2 g/dL (ref 3.5–5.5)
Alkaline Phosphatase: 83 IU/L (ref 39–117)
Bilirubin Total: 0.2 mg/dL (ref 0.0–1.2)
Bilirubin, Direct: 0.08 mg/dL (ref 0.00–0.40)
Total Protein: 7.3 g/dL (ref 6.0–8.5)

## 2015-06-05 LAB — THYROID PANEL WITH TSH
Free Thyroxine Index: 2.6 (ref 1.2–4.9)
T3 Uptake Ratio: 27 % (ref 24–39)
T4, Total: 9.6 ug/dL (ref 4.5–12.0)
TSH: 1.89 u[IU]/mL (ref 0.450–4.500)

## 2015-06-05 NOTE — Therapy (Signed)
[place], [place], [address] Phone: [phone]   Fax:  [phone]  Physical Therapy Treatment  Patient Details  Name: Holly Rodriguez MRN: [id number] Date of Birth: May 21, 1966 Referring Provider:  Adella Nissen, PA-C  Encounter Date: 06/05/2015      PT End of Session - 06/05/15 1805    Visit Number 9   Number of Visits 12   Date for PT Re-Evaluation 06/13/15   PT Start Time 0569   PT Stop Time 1633   PT Time Calculation (min) 43 min   Activity Tolerance Patient tolerated treatment well   Behavior During Therapy [facility] for tasks assessed/performed      Past Medical History  Diagnosis Date  . GERD (gastroesophageal reflux disease)   . Thyroid disease   . Anxiety   . IBS (irritable bowel syndrome)   . Sleep apnea   . Diabetes mellitus without complication     Past Surgical History  Procedure Laterality Date  . Cesarean section    . Tonsillectomy    . Gastric restriction surgery    . Tubal ligation    . Cholecystectomy    . Laparoscopic appendectomy N/A 10/25/2013    Procedure: APPENDECTOMY LAPAROSCOPIC;  Surgeon: Jamesetta So, MD;  Location: [facility];  Service: General;  Laterality: N/A;    There were no vitals filed for this visit.  Visit Diagnosis:  Joint stiffness of spine  Abnormal posture  Pain in thoracic spine  Pain in neck      Subjective Assessment - 06/05/15 1546    Subjective Did not sleep well.  Did OK after last session with bands.     Currently in Pain? No/denies   Pain Location Neck   Pain Orientation Right   Pain Descriptors / Indicators --  Feels like stress   Pain Frequency Intermittent   Aggravating Factors  slouching, driving, cleaning. Carring 6  Lb baby   Pain Relieving Factors DN, posture focus.  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                         [facility] Adult PT Treatment/Exercise - 06/05/15 1557    Shoulder  Exercises: Seated   Horizontal ABduction 10 reps   Theraband Level (Shoulder Horizontal ABduction) Level 3 (Green)   External Rotation 10 reps   Theraband Level (Shoulder External Rotation) Level 3 (Green)  issued for home   Shoulder Exercises: Standing   Extension Strengthening   Theraband Level (Shoulder Extension) Level 3 (Green)   Extension Limitations 10, cues   Row Strengthening   Theraband Level (Shoulder Row) Level 3 (Green)   Row Limitations cues technique 10 reps.   Other Standing Exercises foam roller at wall,, ER ABDuction, diagonals alternating, pull over 10 reps to 15 reps.  small towel needed at head for neutral neck   Manual Therapy   Manual Therapy Soft tissue mobilization   Soft tissue mobilization focus on R levator and trap with BIL suboccipital release  also clavicular portion of pectoralis followed by stretch                   PT Short Term Goals - 05/25/15 1738    PT SHORT TERM GOAL #1   Title Independent wiht inital HEP   Time 3   Period Weeks   Status Achieved   PT SHORT TERM GOAL #2   Title Demonsrate awareness of good posture  Status Achieved   PT SHORT TERM GOAL #3   Title She will report pain improved 30% or more with work tasks   Status Achieved           PT Long Term Goals - 05/29/15 1618    PT LONG TERM GOAL #1   Title she will e independent with all HEP issued as of last visi   Time 6   Period Weeks   Status On-going   PT LONG TERM GOAL #2   Title she will imprve thoracic rotaiton to 60 degrees RT and LT to ease pain   Time 6   Period Weeks   Status On-going   PT LONG TERM GOAL #3   Title She will report pain decreased 75% and become intermittant   Time 6   Period Weeks   Status Achieved   PT LONG TERM GOAL #4   Title Her FOTO score will improve to 40% or less to demo decreased pain and improved funciton   Time 6   Period Weeks   Status On-going               Plan - 06/05/15 1806    Clinical Impression  Statement Progressed to green band for home  Neck ROM WNL.Posture practice in standing helpful, cues needed.     PT Next Visit Plan FOTO 10th visit. [place] specific goals.  Last appointment?   Consulted and Agree with Plan of Care Patient        Problem List Patient Active Problem List   Diagnosis Date Noted  . Morbid obesity 11/15/2014  . Colon polyps 08/03/2014  . Hemorrhoids, external 08/03/2014  . Prolapsed internal hemorrhoids, grade 1 08/03/2014  . Iron deficiency 06/22/2014  . Type 2 diabetes mellitus not at goal 12/06/2013  . Hypertension associated with diabetes 12/06/2013  . Acute appendicitis 10/25/2013    [facility] 06/05/2015, 6:09 PM  [facility] [address] [place], [place], [address] Phone: [phone]   Fax:  [phone]     Melvenia Needles, PTA 06/05/2015 6:09 PM Phone: [phone] Fax: [phone]

## 2015-06-07 ENCOUNTER — Ambulatory Visit: Payer: BLUE CROSS/BLUE SHIELD | Admitting: Physical Therapy

## 2015-06-07 DIAGNOSIS — M2560 Stiffness of unspecified joint, not elsewhere classified: Secondary | ICD-10-CM

## 2015-06-07 DIAGNOSIS — M542 Cervicalgia: Secondary | ICD-10-CM

## 2015-06-07 DIAGNOSIS — M546 Pain in thoracic spine: Secondary | ICD-10-CM

## 2015-06-07 DIAGNOSIS — R293 Abnormal posture: Secondary | ICD-10-CM

## 2015-06-07 NOTE — Therapy (Signed)
[place], [place], [address] Phone: [phone]   Fax:  [phone]  Physical Therapy Treatment  Patient Details  Name: Holly Rodriguez MRN: [id number] Date of Birth: [birth year] Referring Provider:  Adella Nissen, PA-C  Encounter Date: 06/07/2015      PT End of Session - 06/07/15 1811    Visit Number 10   Number of Visits 12   Date for PT Re-Evaluation 06/13/15   PT Start Time 3267   PT Stop Time 1655   PT Time Calculation (min) 70 min   Activity Tolerance Patient tolerated treatment well   Behavior During Therapy [facility] for tasks assessed/performed      Past Medical History  Diagnosis Date  . GERD (gastroesophageal reflux disease)   . Thyroid disease   . Anxiety   . IBS (irritable bowel syndrome)   . Sleep apnea   . Diabetes mellitus without complication     Past Surgical History  Procedure Laterality Date  . Cesarean section    . Tonsillectomy    . Gastric restriction surgery    . Tubal ligation    . Cholecystectomy    . Laparoscopic appendectomy N/A 10/25/2013    Procedure: APPENDECTOMY LAPAROSCOPIC;  Surgeon: Jamesetta So, MD;  Location: [facility];  Service: General;  Laterality: N/A;    There were no vitals filed for this visit.  Visit Diagnosis:  Pain in neck  Pain in thoracic spine  Abnormal posture  Joint stiffness of spine      Subjective Assessment - 06/07/15 1543    Subjective Tight, tense.  Burning sensation gone.  (after the dry needeling)   Pain Score 0-No pain                         [facility] Adult PT Treatment/Exercise - 06/07/15 1550    Neck Exercises: Supine   Other Supine Exercise decompression series. 3 reps each, added to home exercise   Modalities   Modalities Electrical Stimulation   Moist Heat Therapy   Number Minutes Moist Heat 15 Minutes   Moist Heat Location Cervical   Electrical Stimulation   Electrical Stimulation Location neck,  upper trap   Electrical Stimulation Action IFC   Electrical Stimulation Parameters to tolerance   Electrical Stimulation Goals Pain   Manual Therapy   Manual Therapy Soft tissue mobilization  to decrease stiffness from desk work today   Manual therapy comments upper traps, neck levator                PT Education - 06/07/15 1810    Education provided Yes   Education Details decompression series from ex drawer.  ADL posture abd body mechanics   Person(s) Educated Patient   Methods Explanation;Demonstration;Tactile cues;Verbal cues;Handout   Comprehension Verbalized understanding;Returned demonstration          PT Short Term Goals - 05/25/15 1738    PT SHORT TERM GOAL #1   Title Independent wiht inital HEP   Time 3   Period Weeks   Status Achieved   PT SHORT TERM GOAL #2   Title Demonsrate awareness of good posture   Status Achieved   PT SHORT TERM GOAL #3   Title She will report pain improved 30% or more with work tasks   Status Achieved           PT Long Term Goals - 06/07/15 1817    PT LONG TERM GOAL #1  Title she will e independent with all HEP issued as of last visi   Baseline independent   Time 6   Period Weeks   Status Achieved   PT LONG TERM GOAL #2   Title she will imprve thoracic rotaiton to 60 degrees RT and LT to ease pain   Baseline not assessed   Time 6   Period Weeks   Status Deferred   PT LONG TERM GOAL #3   Title She will report pain decreased 75% and become intermittant   Baseline stiff, pressure vs pain   Time 6   Period Weeks   Status Achieved   PT LONG TERM GOAL #4   Title Her FOTO score will improve to 40% or less to demo decreased pain and improved funciton   Baseline 33%   Time 6   Period Weeks   Status Achieved               Plan - 06/07/15 1812    Clinical Impression Statement Last visit today.  She had scheduled 1 more DN session but since it was past POC it was cancelled.  She feels she is ready for  discharge, not really needing continued exercise etc.  FOTO score 47% limited. 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     PT Next Visit Plan Discharge to home exercise program   Consulted and Agree with Plan of Care Patient        Problem List Patient Active Problem List   Diagnosis Date Noted  . Morbid obesity 11/15/2014  . Colon polyps 08/03/2014  . Hemorrhoids, external 08/03/2014  . Prolapsed internal hemorrhoids, grade 1 08/03/2014  . Iron deficiency 06/22/2014  . Type 2 diabetes mellitus not at goal 12/06/2013  . Hypertension associated with diabetes 12/06/2013  . Acute appendicitis 10/25/2013    [facility] 06/07/2015, 6:27 PM  [facility] [address] [place], [place], [address] Phone: [phone]   Fax:  [phone]     Melvenia Needles, PTA 06/07/2015 6:27 PM Phone: [phone] Fax: [phone] PHYSICAL THERAPY DISCHARGE SUMMARY  Visits from Start of Care: 10  Current functional level related to goals / functional outcomes: See above   Remaining deficits: Stiffness primarily with pressure rather than pain   Education / Equipment: HEp Plan: Patient agrees to discharge.  Patient goals were met. Patient is being discharged due to meeting the stated rehab goals.  ?????   Leonie Douglas Chasse PT   06/08/15      7:15

## 2015-06-07 NOTE — Patient Instructions (Addendum)
Posture Tips DO: - stand tall and erect - keep chin tucked in - keep head and shoulders in alignment - check posture regularly in mirror or large window - pull head back against headrest in car seat;  Change your position often.  Sit with lumbar support. DON'T: - slouch or slump while watching TV or reading - sit, stand or lie in one position  for too long;  Sitting is especially hard on the spine so if you sit at a desk/use the computer, then stand up often!   Copyright  VHI. All rights reserved.  Posture - Standing   Good posture is important. Avoid slouching and forward head thrust. Maintain curve in low back and align ears over shoul- ders, hips over ankles.  Pull your belly button in toward your back bone.   Copyright  VHI. All rights reserved.  Posture - Sitting   Sit upright, head facing forward. Try using a roll to support lower back. Keep shoulders relaxed, and avoid rounded back. Keep hips level with knees. Avoid crossing legs for long periods.   Copyright  VHI. All rights reserved.    Sleeping on Back  Place pillow under knees. A pillow with cervical support and a roll around waist are also helpful. Copyright  VHI. All rights reserved.  Sleeping on Side Place pillow between knees. Use cervical support under neck and a roll around waist as needed. Copyright  VHI. All rights reserved.   Sleeping on Stomach   If this is the only desirable sleeping position, place pillow under lower legs, and under stomach or chest as needed.  Posture - Sitting   Sit upright, head facing forward. Try using a roll to support lower back. Keep shoulders relaxed, and avoid rounded back. Keep hips level with knees. Avoid crossing legs for long periods. Stand to Sit / Sit to Stand   To sit: Bend knees to lower self onto front edge of chair, then scoot back on seat. To stand: Reverse sequence by placing one foot forward, and scoot to front of seat. Use rocking motion to stand up.    Work Height and Reach  Ideal work height is no more than 2 to 4 inches below elbow level when standing, and at elbow level when sitting. Reaching should be limited to arm's length, with elbows slightly bent.  Bending  Bend at hips and knees, not back. Keep feet shoulder-width apart.    Posture - Standing   Good posture is important. Avoid slouching and forward head thrust. Maintain curve in low back and align ears over shoul- ders, hips over ankles.  Alternating Positions   Alternate tasks and change positions frequently to reduce fatigue and muscle tension. Take rest breaks. Computer Work   Position work to face forward. Use proper work and seat height. Keep shoulders back and down, wrists straight, and elbows at right angles. Use chair that provides full back support. Add footrest and lumbar roll as needed.  Getting Into / Out of Car  Lower self onto seat, scoot back, then bring in one leg at a time. Reverse sequence to get out.  Dressing  Lie on back to pull socks or slacks over feet, or sit and bend leg while keeping back straight.    Housework - Sink  Place one foot on ledge of cabinet under sink when standing at sink for prolonged periods.   Pushing / Pulling  Pushing is preferable to pulling. Keep back in proper alignment, and use leg muscles to   do the work.  Deep Squat   Squat and lift with both arms held against upper trunk. Tighten stomach muscles without holding breath. Use smooth movements to avoid jerking.  Avoid Twisting   Avoid twisting or bending back. Pivot around using foot movements, and bend at knees if needed when reaching for articles.  Carrying Luggage   Distribute weight evenly on both sides. Use a cart whenever possible. Do not twist trunk. Move body as a unit.   Lifting Principles .Maintain proper posture and head alignment. .Slide object as close as possible before lifting. .Move obstacles out of the way. .Test before  lifting; ask for help if too heavy. .Tighten stomach muscles without holding breath. .Use smooth movements; do not jerk. .Use legs to do the work, and pivot with feet. .Distribute the work load symmetrically and close to the center of trunk. .Push instead of pull whenever possible.   Ask For Help   Ask for help and delegate to others when possible. Coordinate your movements when lifting together, and maintain the low back curve.  Log Roll   Lying on back, bend left knee and place left arm across chest. Roll all in one movement to the right. Reverse to roll to the left. Always move as one unit. Housework - Sweeping  Use long-handled equipment to avoid stooping.   Housework - Wiping  Position yourself as close as possible to reach work surface. Avoid straining your back.  Laundry - Unloading Wash   To unload small items at bottom of washer, lift leg opposite to arm being used to reach.  Gardening - Raking  Move close to area to be raked. Use arm movements to do the work. Keep back straight and avoid twisting.     Cart  When reaching into cart with one arm, lift opposite leg to keep back straight.   Getting Into / Out of Bed  Lower self to lie down on one side by raising legs and lowering head at the same time. Use arms to assist moving without twisting. Bend both knees to roll onto back if desired. To sit up, start from lying on side, and use same move-ments in reverse. Housework - Vacuuming  Hold the vacuum with arm held at side. Step back and forth to move it, keeping head up. Avoid twisting.   Laundry - Loading Wash  Position laundry basket so that bending and twisting can be avoided.   Laundry - Unloading Dryer  Squat down to reach into clothes dryer or use a reacher.  Gardening - Weeding / Planting  Squat or Kneel. Knee pads may be helpful.                    

## 2015-06-15 ENCOUNTER — Ambulatory Visit (INDEPENDENT_AMBULATORY_CARE_PROVIDER_SITE_OTHER): Payer: BLUE CROSS/BLUE SHIELD | Admitting: Family Medicine

## 2015-06-15 ENCOUNTER — Encounter: Payer: Self-pay | Admitting: Family Medicine

## 2015-06-15 ENCOUNTER — Encounter: Payer: BLUE CROSS/BLUE SHIELD | Admitting: Physical Therapy

## 2015-06-15 DIAGNOSIS — M542 Cervicalgia: Secondary | ICD-10-CM

## 2015-06-15 DIAGNOSIS — M5030 Other cervical disc degeneration, unspecified cervical region: Secondary | ICD-10-CM | POA: Diagnosis not present

## 2015-06-15 DIAGNOSIS — G629 Polyneuropathy, unspecified: Secondary | ICD-10-CM | POA: Diagnosis not present

## 2015-06-15 NOTE — Patient Instructions (Signed)
Continue to be careful and avoid heavy lifting pushing pulling and always try to maintain good posture Continue to do home therapy as prescribed by the physical therapist as needed We will not schedule any further visits for return as we think you have reached her maximum potential and can be discharged
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
## 2015-07-07 ENCOUNTER — Telehealth: Payer: Self-pay | Admitting: Physician Assistant

## 2015-07-07 DIAGNOSIS — E039 Hypothyroidism, unspecified: Secondary | ICD-10-CM

## 2015-07-07 MED ORDER — LISINOPRIL-HYDROCHLOROTHIAZIDE 20-25 MG PO TABS: ORAL_TABLET | ORAL | Status: DC

## 2015-07-07 MED ORDER — LEVOTHYROXINE SODIUM 75 MCG PO TABS: 75.0000 ug | ORAL_TABLET | Freq: Every day | ORAL | Status: DC

## 2015-07-07 MED ORDER — VITAMIN D (ERGOCALCIFEROL) 1.25 MG (50000 UNIT) PO CAPS: ORAL_CAPSULE | ORAL | Status: DC

## 2015-07-07 NOTE — Telephone Encounter (Signed)
done

## 2015-09-01 ENCOUNTER — Ambulatory Visit: Payer: BLUE CROSS/BLUE SHIELD | Admitting: Family Medicine

## 2015-10-16 ENCOUNTER — Other Ambulatory Visit: Payer: Self-pay | Admitting: Family Medicine

## 2015-10-17 NOTE — Telephone Encounter (Signed)
Last seen 06/15/15  DWM  Last Vit D 06/03/15  24.1

## 2015-10-28 LAB — HM DIABETES EYE EXAM

## 2015-10-30 ENCOUNTER — Telehealth: Payer: Self-pay | Admitting: Physician Assistant

## 2015-11-03 NOTE — Telephone Encounter (Signed)
duplicate

## 2015-11-06 ENCOUNTER — Encounter: Payer: Self-pay | Admitting: *Deleted

## 2015-11-08 ENCOUNTER — Ambulatory Visit: Payer: BLUE CROSS/BLUE SHIELD | Admitting: Family Medicine

## 2015-11-14 ENCOUNTER — Ambulatory Visit (INDEPENDENT_AMBULATORY_CARE_PROVIDER_SITE_OTHER): Payer: BLUE CROSS/BLUE SHIELD | Admitting: Family Medicine

## 2015-11-14 ENCOUNTER — Encounter: Payer: Self-pay | Admitting: Family Medicine

## 2015-11-14 ENCOUNTER — Ambulatory Visit (INDEPENDENT_AMBULATORY_CARE_PROVIDER_SITE_OTHER): Payer: BLUE CROSS/BLUE SHIELD

## 2015-11-14 VITALS — BP 108/68 | HR 97 | Temp 97.8°F | Ht 63.0 in | Wt 285.0 lb

## 2015-11-14 DIAGNOSIS — R1031 Right lower quadrant pain: Secondary | ICD-10-CM

## 2015-11-14 DIAGNOSIS — I10 Essential (primary) hypertension: Secondary | ICD-10-CM | POA: Diagnosis not present

## 2015-11-14 DIAGNOSIS — D509 Iron deficiency anemia, unspecified: Secondary | ICD-10-CM

## 2015-11-14 DIAGNOSIS — E039 Hypothyroidism, unspecified: Secondary | ICD-10-CM

## 2015-11-14 DIAGNOSIS — M25579 Pain in unspecified ankle and joints of unspecified foot: Secondary | ICD-10-CM | POA: Diagnosis not present

## 2015-11-14 DIAGNOSIS — E559 Vitamin D deficiency, unspecified: Secondary | ICD-10-CM

## 2015-11-14 DIAGNOSIS — E1159 Type 2 diabetes mellitus with other circulatory complications: Secondary | ICD-10-CM | POA: Diagnosis not present

## 2015-11-14 DIAGNOSIS — K219 Gastro-esophageal reflux disease without esophagitis: Secondary | ICD-10-CM

## 2015-11-14 DIAGNOSIS — E119 Type 2 diabetes mellitus without complications: Secondary | ICD-10-CM | POA: Diagnosis not present

## 2015-11-14 DIAGNOSIS — I152 Hypertension secondary to endocrine disorders: Secondary | ICD-10-CM

## 2015-11-14 DIAGNOSIS — F411 Generalized anxiety disorder: Secondary | ICD-10-CM | POA: Diagnosis not present

## 2015-11-14 MED ORDER — LEVOTHYROXINE SODIUM 75 MCG PO TABS
75.0000 ug | ORAL_TABLET | Freq: Every day | ORAL | Status: DC
Start: 2015-11-14 — End: 2016-11-29

## 2015-11-14 MED ORDER — POTASSIUM CHLORIDE ER 10 MEQ PO TBCR: 10.0000 meq | EXTENDED_RELEASE_TABLET | Freq: Every day | ORAL | Status: DC

## 2015-11-14 MED ORDER — HYOSCYAMINE SULFATE ER 0.375 MG PO TB12: 0.3750 mg | ORAL_TABLET | Freq: Two times a day (BID) | ORAL | Status: DC

## 2015-11-14 MED ORDER — OMEPRAZOLE 40 MG PO CPDR: 40.0000 mg | DELAYED_RELEASE_CAPSULE | Freq: Every day | ORAL | Status: DC

## 2015-11-14 MED ORDER — ALPRAZOLAM 0.5 MG PO TABS: 1.0000 mg | ORAL_TABLET | Freq: Every evening | ORAL | Status: DC | PRN

## 2015-11-14 MED ORDER — METFORMIN HCL 500 MG PO TABS: 500.0000 mg | ORAL_TABLET | Freq: Two times a day (BID) | ORAL | Status: DC

## 2015-11-14 MED ORDER — LISINOPRIL-HYDROCHLOROTHIAZIDE 20-25 MG PO TABS: ORAL_TABLET | ORAL | Status: DC

## 2015-11-14 NOTE — Progress Notes (Addendum)
Subjective:    Patient ID: Holly Rodriguez, female    DOB: 07/08/1966, 49 y.o.   MRN: [id number]  HPI Pt here for follow up and management of chronic medical problems. Which includes diabetes, hypothyroidism and hypertension. She is taking medications regularly. The patient does complain of some arthralgias on her right side and in her right groin area. This is especially noticeable when she lifts her right leg and abducts it. She is requesting refills on her omeprazole and Xanax. She refuses to take the flu shot. As she is too young for the Prevnar vaccine. She will return to the office fasting for lab work. She needs a prescription for diabetic shoes which we will give to her today. Her feet have been hurting and she has calluses on both great toes and the lateral foot on both feet. She denies chest pain or shortness of breath. She has no trouble swallowing and has no heartburn indigestion and nausea vomiting diarrhea or blood in the stool. She is passing her water without problems. She says she has overeaten during the holidays and feels more bloated than usual.       Patient Active Problem List   Diagnosis Date Noted  . Morbid obesity ([place]) 11/15/2014  . Colon polyps 08/03/2014  . Hemorrhoids, external 08/03/2014  . Prolapsed internal hemorrhoids, grade 1 08/03/2014  . Iron deficiency 06/22/2014  . Type 2 diabetes mellitus not at goal [facility]) 12/06/2013  . Hypertension associated with diabetes ([place]) 12/06/2013  . Acute appendicitis 10/25/2013   Outpatient Encounter Prescriptions as of 11/14/2015  Medication Sig  . ALPRAZolam (XANAX) 0.5 MG tablet Take 2 tablets (1 mg total) by mouth at bedtime as needed for anxiety.  Marland Kitchen glucose blood test strip Reli-on glucometer.  Test Blood sugar qid Dx. 250.01  . hyoscyamine (LEVBID) 0.375 MG 12 hr tablet TAKE ONE TABLET BY MOUTH TWICE DAILY  . KLOR-CON 10 10 MEQ tablet TAKE 1 TABLET DAILY  . Lancets (ONETOUCH ULTRASOFT) lancets Test 1X per day and  prn   Dx 250.01  . levothyroxine (SYNTHROID) 75 MCG tablet Take 1 tablet (75 mcg total) by mouth daily before breakfast.  . lisinopril-hydrochlorothiazide (PRINZIDE,ZESTORETIC) 20-25 MG per tablet TAKE 1 TABLET DAILY        (DISCONTINUE FUROSEMIDE)  . metformin (FORTAMET) 1000 MG (OSM) 24 hr tablet Take 1 tablet (1,000 mg total) by mouth daily with breakfast.  . Multiple Vitamins-Minerals (ALIVE WOMENS ENERGY PO) Take 1 tablet by mouth daily.  . Omega-3 Fatty Acids (OMEGA 3 PO) Take 1 capsule by mouth daily. Uses Mega Red 500 mg daily  . omeprazole (PRILOSEC) 40 MG capsule Take 1 capsule (40 mg total) by mouth daily.  . Vitamin D, Ergocalciferol, (DRISDOL) 50000 UNITS CAPS capsule TAKE 1 CAPSULE EVERY 7 DAYS  . [DISCONTINUED] ALPRAZolam (XANAX) 0.5 MG tablet Take 2 tablets (1 mg total) by mouth at bedtime as needed for anxiety.  . [DISCONTINUED] omeprazole (PRILOSEC) 40 MG capsule Take 1 capsule (40 mg total) by mouth daily.   No facility-administered encounter medications on file as of 11/14/2015.      Review of Systems  Constitutional: Negative.   HENT: Negative.   Eyes: Negative.   Respiratory: Negative.   Cardiovascular: Negative.   Gastrointestinal: Negative.   Endocrine: Negative.   Genitourinary: Negative.   Musculoskeletal: Positive for arthralgias (Right side groin/pelvic pain).  Skin: Negative.   Allergic/Immunologic: Negative.   Neurological: Negative.   Hematological: Negative.   Psychiatric/Behavioral: Negative.  Objective:   Physical Exam  Constitutional: She is oriented to person, place, and time. She appears well-developed and well-nourished. No distress.  HENT:  Head: Normocephalic and atraumatic.  Right Ear: External ear normal.  Left Ear: External ear normal.  Nose: Nose normal.  Mouth/Throat: Oropharynx is clear and moist.  Eyes: Conjunctivae and EOM are normal. Pupils are equal, round, and reactive to light. Right eye exhibits no discharge. Left  eye exhibits no discharge. No scleral icterus.  Neck: Normal range of motion. Neck supple. No thyromegaly present.  Cardiovascular: Normal rate, regular rhythm, normal heart sounds and intact distal pulses.   No murmur heard. At 84/m with a regular rate and rhythm  Pulmonary/Chest: Effort normal and breath sounds normal. No respiratory distress. She has no wheezes. She has no rales. She exhibits no tenderness.  Clear anteriorly and posteriorly  Abdominal: Soft. Bowel sounds are normal. She exhibits no mass. There is no tenderness. There is no rebound and no guarding.  The abdomen is obese and the patient is slightly tender in the epigastric area. There were no masses apparent and no organ enlargement..  Musculoskeletal: Normal range of motion. She exhibits tenderness. She exhibits no edema.  With leg raising and abduction bilaterally the patient felt the pain in the right groin area. The right groin area was examined and there was no sign of any infection or cellulitis. She was tender in this area but especially with abducting the femur and raising it.  Lymphadenopathy:    She has no cervical adenopathy.  Neurological: She is alert and oriented to person, place, and time. She has normal reflexes. No cranial nerve deficit.  Skin: Skin is warm and dry. No rash noted.  Psychiatric: She has a normal mood and affect. Her behavior is normal. Judgment and thought content normal.  Nursing note and vitals reviewed.   BP 108/68 mmHg  Pulse 97  Temp(Src) 97.8 F (36.6 C) (Oral)  Ht 5' 3"  (1.6 m)  Wt 285 lb (129.275 kg)  BMI 50.50 kg/m2  LMP 10/29/2015 (Exact Date)  [facility] reading (PRIMARY) by  Dr. Governor Rooks hip and groin   ----no obvious bony abnormality                                    Assessment & Plan:  1. Hypothyroidism, unspecified hypothyroidism type -Continue current treatment pending results of lab work - CBC with Differential/Platelet; Future  2. Type 2 diabetes mellitus not at  goal [facility]) -Continue current treatment pending results of lab work - BMP8+EGFR; Future - CBC with Differential/Platelet; Future - Lipid panel; Future  3. Hypertension associated with diabetes ([place]) -The blood pressure is good today and she will continue current treatment - BMP8+EGFR; Future - CBC with Differential/Platelet; Future - Hepatic function panel; Future - Lipid panel; Future  4. Vitamin D deficiency -Continue current treatment pending results of lab work - CBC with Differential/Platelet; Future - VITAMIN D 25 Hydroxy (Vit-D Deficiency, Fractures); Future  5. Gastroesophageal reflux disease, esophagitis presence not specified -She is having no reflux symptoms and will continue with her omeprazole - CBC with Differential/Platelet; Future - Hepatic function panel; Future  6. Anemia, iron deficiency -No history of any bleeding issues  - CBC with Differential/Platelet; Future  7. Anxiety state -Continue alprazolam as needed - ALPRAZolam (XANAX) 0.5 MG tablet; Take 2 tablets (1 mg total) by mouth at bedtime as needed for anxiety.  Dispense: 90  tablet; Refill: 1 - CBC with Differential/Platelet; Future  8. Pain in joint, ankle and foot, unspecified laterality -The patient also has calluses on both feet as well as foot pain and numbness bilaterally. - DG HIP UNILAT W OR W/O PELVIS 2-3 VIEWS RIGHT; Future  9. Groin pain, right -Referred to orthopedic surgeon for further evaluation - DG HIP UNILAT W OR W/O PELVIS 2-3 VIEWS RIGHT; Future Meds ordered this encounter  Medications  . omeprazole (PRILOSEC) 40 MG capsule    Sig: Take 1 capsule (40 mg total) by mouth daily.    Dispense:  90 capsule    Refill:  3  . ALPRAZolam (XANAX) 0.5 MG tablet    Sig: Take 2 tablets (1 mg total) by mouth at bedtime as needed for anxiety.    Dispense:  90 tablet    Refill:  1   Patient Instructions  Continue current medications. Continue good therapeutic lifestyle changes which include  good diet and exercise. Fall precautions discussed with patient. If an FOBT was given today- please return it to our front desk. If you are over 53 years old - you may need Prevnar 26 or the adult Pneumonia vaccine.  **Flu shots are available--- please call and schedule a FLU-CLINIC appointment**  After your visit with [place] today you will receive a survey in the mail or online from [employer] regarding your care with [place]. Please take a moment to fill this out. Your feedback is very important to [place] as you can help [place] better understand your patient needs as well as improve your experience and satisfaction. WE CARE ABOUT YOU!!!   We will get x-rays of the right hip and groin today and will call you with the results as soon as possible We will also arrange for you to see the orthopedic surgeon to follow-up on this right groin pain as soon as possible Continue to monitor blood sugars at home We will give you a prescription for diabetic shoes to see if this helps your foot pain and the calluses that are currently experiencing.  She does have some bilateral foot soreness and numbness as well as the calluses.   Arrie Senate MD

## 2015-11-14 NOTE — Addendum Note (Signed)
Addended by: Zannie Cove on: 11/14/2015 04:14 PM   Modules accepted: Orders

## 2015-11-14 NOTE — Addendum Note (Signed)
Addended by: Zannie Cove on: 11/14/2015 04:09 PM   Modules accepted: Orders

## 2015-11-14 NOTE — Patient Instructions (Addendum)
Continue current medications. Continue good therapeutic lifestyle changes which include good diet and exercise. Fall precautions discussed with patient. If an FOBT was given today- please return it to our front desk. If you are over 49 years old - you may need Prevnar 54 or the adult Pneumonia vaccine.  **Flu shots are available--- please call and schedule a FLU-CLINIC appointment**  After your visit with [place] today you will receive a survey in the mail or online from [employer] regarding your care with [place]. Please take a moment to fill this out. Your feedback is very important to [place] as you can help [place] better understand your patient needs as well as improve your experience and satisfaction. WE CARE ABOUT YOU!!!   We will get x-rays of the right hip and groin today and will call you with the results as soon as possible We will also arrange for you to see the orthopedic surgeon to follow-up on this right groin pain as soon as possible Continue to monitor blood sugars at home We will give you a prescription for diabetic shoes to see if this helps your foot pain and the calluses that are currently experiencing.

## 2015-11-14 NOTE — Addendum Note (Signed)
Addended by: Zannie Cove on: 11/14/2015 04:10 PM   Modules accepted: Orders

## 2015-11-15 ENCOUNTER — Telehealth: Payer: Self-pay | Admitting: Family Medicine

## 2015-11-28 ENCOUNTER — Telehealth: Payer: Self-pay | Admitting: Family Medicine

## 2015-11-28 NOTE — Telephone Encounter (Signed)
This was for notes on diabetes and foot problems - this was noted at last visit

## 2015-11-28 NOTE — Telephone Encounter (Signed)
error 

## 2015-11-29 NOTE — Telephone Encounter (Signed)
Faxed forms

## 2015-11-29 NOTE — Telephone Encounter (Signed)
Pt coming back for foot exam

## 2015-12-02 ENCOUNTER — Other Ambulatory Visit: Payer: BLUE CROSS/BLUE SHIELD

## 2015-12-02 DIAGNOSIS — F411 Generalized anxiety disorder: Secondary | ICD-10-CM

## 2015-12-02 DIAGNOSIS — E559 Vitamin D deficiency, unspecified: Secondary | ICD-10-CM

## 2015-12-02 DIAGNOSIS — E039 Hypothyroidism, unspecified: Secondary | ICD-10-CM

## 2015-12-02 DIAGNOSIS — I10 Essential (primary) hypertension: Secondary | ICD-10-CM

## 2015-12-02 DIAGNOSIS — K219 Gastro-esophageal reflux disease without esophagitis: Secondary | ICD-10-CM

## 2015-12-02 DIAGNOSIS — E119 Type 2 diabetes mellitus without complications: Secondary | ICD-10-CM

## 2015-12-02 DIAGNOSIS — D509 Iron deficiency anemia, unspecified: Secondary | ICD-10-CM

## 2015-12-02 DIAGNOSIS — E1159 Type 2 diabetes mellitus with other circulatory complications: Secondary | ICD-10-CM

## 2015-12-02 DIAGNOSIS — I152 Hypertension secondary to endocrine disorders: Secondary | ICD-10-CM

## 2015-12-04 LAB — CBC WITH DIFFERENTIAL/PLATELET
BASOS ABS: 0.1 10*3/uL (ref 0.0–0.2)
Basos: 1 %
EOS (ABSOLUTE): 0.3 10*3/uL (ref 0.0–0.4)
Eos: 3 %
HEMOGLOBIN: 11.9 g/dL (ref 11.1–15.9)
Hematocrit: 35.4 % (ref 34.0–46.6)
IMMATURE GRANS (ABS): 0 10*3/uL (ref 0.0–0.1)
Immature Granulocytes: 0 %
LYMPHS: 36 %
Lymphocytes Absolute: 4.5 10*3/uL — ABNORMAL HIGH (ref 0.7–3.1)
MCH: 29.6 pg (ref 26.6–33.0)
MCHC: 33.6 g/dL (ref 31.5–35.7)
MCV: 88 fL (ref 79–97)
MONOCYTES: 5 %
Monocytes Absolute: 0.6 10*3/uL (ref 0.1–0.9)
NEUTROS ABS: 6.8 10*3/uL (ref 1.4–7.0)
Neutrophils: 55 %
Platelets: 383 10*3/uL — ABNORMAL HIGH (ref 150–379)
RBC: 4.02 x10E6/uL (ref 3.77–5.28)
RDW: 14.2 % (ref 12.3–15.4)
WBC: 12.3 10*3/uL — AB (ref 3.4–10.8)

## 2015-12-04 LAB — LIPID PANEL
CHOL/HDL RATIO: 5.9 ratio — AB (ref 0.0–4.4)
Cholesterol, Total: 194 mg/dL (ref 100–199)
HDL: 33 mg/dL — ABNORMAL LOW (ref >39)
LDL CALC: 92 mg/dL (ref 0–99)
Triglycerides: 344 mg/dL — ABNORMAL HIGH (ref 0–149)
VLDL Cholesterol Cal: 69 mg/dL — ABNORMAL HIGH (ref 5–40)

## 2015-12-04 LAB — HEPATIC FUNCTION PANEL
ALBUMIN: 4.2 g/dL (ref 3.5–5.5)
ALK PHOS: 90 IU/L (ref 39–117)
ALT: 21 IU/L (ref 0–32)
AST: 19 IU/L (ref 0–40)
BILIRUBIN TOTAL: 0.2 mg/dL (ref 0.0–1.2)
BILIRUBIN, DIRECT: 0.09 mg/dL (ref 0.00–0.40)
TOTAL PROTEIN: 7.1 g/dL (ref 6.0–8.5)

## 2015-12-04 LAB — BMP8+EGFR
BUN/Creatinine Ratio: 23 (ref 9–23)
BUN: 20 mg/dL (ref 6–24)
CALCIUM: 9.1 mg/dL (ref 8.7–10.2)
CHLORIDE: 100 mmol/L (ref 96–106)
CO2: 22 mmol/L (ref 18–29)
Creatinine, Ser: 0.88 mg/dL (ref 0.57–1.00)
GFR calc non Af Amer: 77 mL/min/{1.73_m2} (ref >59)
GFR, EST AFRICAN AMERICAN: 89 mL/min/{1.73_m2} (ref >59)
Glucose: 132 mg/dL — ABNORMAL HIGH (ref 65–99)
Potassium: 4.9 mmol/L (ref 3.5–5.2)
Sodium: 140 mmol/L (ref 134–144)

## 2015-12-04 LAB — VITAMIN D 25 HYDROXY (VIT D DEFICIENCY, FRACTURES): Vit D, 25-Hydroxy: 29.1 ng/mL — ABNORMAL LOW (ref 30.0–100.0)

## 2015-12-05 LAB — HGB A1C W/O EAG: Hgb A1c MFr Bld: 6.9 % — ABNORMAL HIGH (ref 4.8–5.6)

## 2015-12-05 LAB — SPECIMEN STATUS REPORT

## 2016-01-05 ENCOUNTER — Telehealth: Payer: Self-pay | Admitting: Family Medicine

## 2016-01-05 NOTE — Telephone Encounter (Signed)
The patient is currently taking lisinopril HCTZ 20/25 one daily. She indicates that she had to go to the emergency room at [facility] because of gout. We need to know what her uric acid was. HCTZ can raise the uric acid. In the short-term please call in lisinopril 25 mg without the HCTZ. Have her take the lisinopril regularly for couple weeks and come by the office for repeat blood pressure and a uric acid. If the lisinopril is not controlling her blood pressure by itself, we will need to add something else besides a diuretic to her regimen to go along with the lisinopril.

## 2016-01-06 MED ORDER — LISINOPRIL 20 MG PO TABS: 20.0000 mg | ORAL_TABLET | Freq: Every day | ORAL | Status: DC

## 2016-01-06 NOTE — Telephone Encounter (Signed)
Patient aware and I will call Monday to get records from [place]. We sent in lisinopril 20 mg to pharmacy. Patient will call and make nurse visit appointment in 2 weeks.

## 2016-01-17 DIAGNOSIS — J189 Pneumonia, unspecified organism: Secondary | ICD-10-CM

## 2016-01-17 HISTORY — DX: Pneumonia, unspecified organism: J18.9

## 2016-02-03 ENCOUNTER — Ambulatory Visit (INDEPENDENT_AMBULATORY_CARE_PROVIDER_SITE_OTHER): Payer: BLUE CROSS/BLUE SHIELD | Admitting: Family Medicine

## 2016-02-03 ENCOUNTER — Encounter: Payer: Self-pay | Admitting: Family Medicine

## 2016-02-03 VITALS — BP 128/85 | HR 98 | Temp 98.4°F | Ht 63.0 in | Wt 267.8 lb

## 2016-02-03 DIAGNOSIS — R1031 Right lower quadrant pain: Secondary | ICD-10-CM

## 2016-02-03 DIAGNOSIS — F411 Generalized anxiety disorder: Secondary | ICD-10-CM

## 2016-02-03 DIAGNOSIS — E1142 Type 2 diabetes mellitus with diabetic polyneuropathy: Secondary | ICD-10-CM | POA: Diagnosis not present

## 2016-02-03 DIAGNOSIS — F4321 Adjustment disorder with depressed mood: Secondary | ICD-10-CM | POA: Diagnosis not present

## 2016-02-03 DIAGNOSIS — J101 Influenza due to other identified influenza virus with other respiratory manifestations: Secondary | ICD-10-CM

## 2016-02-03 DIAGNOSIS — Z634 Disappearance and death of family member: Secondary | ICD-10-CM

## 2016-02-03 MED ORDER — METOPROLOL SUCCINATE ER 25 MG PO TB24: 25.0000 mg | ORAL_TABLET | Freq: Every day | ORAL | Status: DC

## 2016-02-03 MED ORDER — DICYCLOMINE HCL 20 MG PO TABS: 20.0000 mg | ORAL_TABLET | Freq: Three times a day (TID) | ORAL | Status: DC

## 2016-02-03 MED ORDER — OSELTAMIVIR PHOSPHATE 75 MG PO CAPS: 75.0000 mg | ORAL_CAPSULE | Freq: Two times a day (BID) | ORAL | Status: DC
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
## 2016-02-04 LAB — CMP14+EGFR
A/G RATIO: 1.5 (ref 1.2–2.2)
ALT: 22 IU/L (ref 0–32)
AST: 19 IU/L (ref 0–40)
Albumin: 4.2 g/dL (ref 3.5–5.5)
Alkaline Phosphatase: 94 IU/L (ref 39–117)
BUN/Creatinine Ratio: 15 (ref 9–23)
BUN: 12 mg/dL (ref 6–24)
Bilirubin Total: 0.3 mg/dL (ref 0.0–1.2)
CALCIUM: 9.3 mg/dL (ref 8.7–10.2)
CO2: 21 mmol/L (ref 18–29)
Chloride: 99 mmol/L (ref 96–106)
Creatinine, Ser: 0.79 mg/dL (ref 0.57–1.00)
GFR calc Af Amer: 102 mL/min/{1.73_m2} (ref >59)
GFR, EST NON AFRICAN AMERICAN: 88 mL/min/{1.73_m2} (ref >59)
Globulin, Total: 2.8 g/dL (ref 1.5–4.5)
Glucose: 108 mg/dL — ABNORMAL HIGH (ref 65–99)
POTASSIUM: 4.2 mmol/L (ref 3.5–5.2)
Sodium: 139 mmol/L (ref 134–144)
Total Protein: 7 g/dL (ref 6.0–8.5)

## 2016-02-04 LAB — CBC WITH DIFFERENTIAL/PLATELET
BASOS ABS: 0.1 10*3/uL (ref 0.0–0.2)
Basos: 1 %
EOS (ABSOLUTE): 0.1 10*3/uL (ref 0.0–0.4)
Eos: 2 %
Hematocrit: 36.3 % (ref 34.0–46.6)
Hemoglobin: 11.9 g/dL (ref 11.1–15.9)
IMMATURE GRANULOCYTES: 1 %
Immature Grans (Abs): 0 10*3/uL (ref 0.0–0.1)
Lymphocytes Absolute: 1.6 10*3/uL (ref 0.7–3.1)
Lymphs: 22 %
MCH: 28.5 pg (ref 26.6–33.0)
MCHC: 32.8 g/dL (ref 31.5–35.7)
MCV: 87 fL (ref 79–97)
MONOS ABS: 0.8 10*3/uL (ref 0.1–0.9)
Monocytes: 12 %
NEUTROS PCT: 62 %
Neutrophils Absolute: 4.5 10*3/uL (ref 1.4–7.0)
PLATELETS: 294 10*3/uL (ref 150–379)
RBC: 4.18 x10E6/uL (ref 3.77–5.28)
RDW: 14.1 % (ref 12.3–15.4)
WBC: 7.2 10*3/uL (ref 3.4–10.8)

## 2016-02-04 LAB — URIC ACID: URIC ACID: 8.2 mg/dL — AB (ref 2.5–7.1)

## 2016-02-04 LAB — LIPASE: Lipase: 15 U/L (ref 0–59)

## 2016-02-05 ENCOUNTER — Telehealth: Payer: Self-pay | Admitting: Family Medicine

## 2016-02-05 ENCOUNTER — Encounter: Payer: Self-pay | Admitting: Family Medicine

## 2016-02-05 ENCOUNTER — Ambulatory Visit (INDEPENDENT_AMBULATORY_CARE_PROVIDER_SITE_OTHER): Payer: BLUE CROSS/BLUE SHIELD | Admitting: Family Medicine

## 2016-02-05 VITALS — BP 119/85 | HR 94 | Temp 100.8°F | Ht 63.0 in | Wt 266.0 lb

## 2016-02-05 DIAGNOSIS — J189 Pneumonia, unspecified organism: Secondary | ICD-10-CM | POA: Diagnosis not present

## 2016-02-05 MED ORDER — FLUTICASONE PROPIONATE 50 MCG/ACT NA SUSP: 1.0000 | Freq: Two times a day (BID) | NASAL | Status: DC | PRN

## 2016-02-05 MED ORDER — AZITHROMYCIN 250 MG PO TABS: ORAL_TABLET | ORAL | Status: DC

## 2016-02-05 MED ORDER — HYDROCODONE-HOMATROPINE 5-1.5 MG/5ML PO SYRP: 5.0000 mL | ORAL_SOLUTION | Freq: Four times a day (QID) | ORAL | Status: DC | PRN

## 2016-02-05 MED ORDER — CEFTRIAXONE SODIUM 1 G IJ SOLR
1.0000 g | INTRAMUSCULAR | Status: AC
  Administered 2016-02-05: 1 g via INTRAMUSCULAR

## 2016-02-05 MED ORDER — ALBUTEROL SULFATE HFA 108 (90 BASE) MCG/ACT IN AERS: 2.0000 | INHALATION_SPRAY | Freq: Four times a day (QID) | RESPIRATORY_TRACT | Status: DC | PRN

## 2016-02-05 NOTE — Addendum Note (Signed)
Addended by: Caryl Pina on: 02/05/2016 03:18 PM   Modules accepted: Orders

## 2016-02-05 NOTE — Telephone Encounter (Signed)
Patient aware.

## 2016-02-05 NOTE — Progress Notes (Signed)
BP 119/85 mmHg  Pulse 94  Temp(Src) 100.8 F (38.2 C) (Oral)  Ht 5\' 3"  (1.6 m)  Wt 266 lb (120.657 kg)  BMI 47.13 kg/m2  LMP 01/29/2016   Subjective:    Patient ID: Holly Rodriguez, female    DOB: [birth year], 50 y.o.   MRN: [id number]  HPI: Holly Rodriguez is a 50 y.o. female presenting on 02/05/2016 for Cough, chest and sinus congestion, sinus pain, left ear pain   HPI Cough and chest congestion and sinus congestion Patient has been having cough and chest congestion and sinus congestion and shortness of breath and wheezing that has been worsening over the past 3 days. She was seen here 2 days ago and was given treatment for influenza with Tamiflu and has been taking that but she feels like she's gotten worse over the past 2 days. The fever of 100.8 today and had been feeling fevers and chills yesterday but did not actually take the temperature. She denies any sick contacts that she knows of. She has been taking the Tamiflu for 2 days and feels like it has gotten worse. She is also using Tylenol and ibuprofen to help with the symptoms. Her cough is productive of yellow-green sputum. Her shortness and wheezing have been increasing especially at night.  Relevant past medical, surgical, family and social history reviewed and updated as indicated. Interim medical history since our last visit reviewed. Allergies and medications reviewed and updated.  Review of Systems  Constitutional: Positive for fever and chills.  HENT: Positive for congestion, postnasal drip, rhinorrhea, sinus pressure, sneezing and sore throat. Negative for ear discharge and ear pain.   Eyes: Negative for pain, redness and visual disturbance.  Respiratory: Positive for cough, shortness of breath and wheezing. Negative for chest tightness.   Cardiovascular: Negative for chest pain and leg swelling.  Genitourinary: Negative for dysuria and difficulty urinating.  Musculoskeletal: Negative for back pain and gait problem.    Skin: Negative for rash.  Neurological: Negative for light-headedness and headaches.  Psychiatric/Behavioral: Negative for behavioral problems and agitation.  All other systems reviewed and are negative.   Per HPI unless specifically indicated above     Medication List       This list is accurate as of: 02/05/16  2:31 PM.  Always use your most recent med list.               ALIVE WOMENS ENERGY PO  Take 1 tablet by mouth daily.     ALPRAZolam 0.5 MG tablet  Commonly known as:  XANAX  Take 2 tablets (1 mg total) by mouth at bedtime as needed for anxiety.     azithromycin 250 MG tablet  Commonly known as:  ZITHROMAX  Take 2 the first day and then one each day after.     dicyclomine 20 MG tablet  Commonly known as:  BENTYL  Take 1 tablet (20 mg total) by mouth 4 (four) times daily -  before meals and at bedtime.     fluticasone 50 MCG/ACT nasal spray  Commonly known as:  FLONASE  Place 1 spray into both nostrils 2 (two) times daily as needed for allergies or rhinitis.     glucose blood test strip  Reli-on glucometer.  Test Blood sugar qid Dx. 250.01     hyoscyamine 0.375 MG 12 hr tablet  Commonly known as:  LEVBID  Take 1 tablet (0.375 mg total) by mouth 2 (two) times daily.     levothyroxine 75  MCG tablet  Commonly known as:  SYNTHROID  Take 1 tablet (75 mcg total) by mouth daily before breakfast.     lisinopril 20 MG tablet  Commonly known as:  PRINIVIL,ZESTRIL  Take 1 tablet (20 mg total) by mouth daily.     metFORMIN 500 MG tablet  Commonly known as:  GLUCOPHAGE  Take 1 tablet (500 mg total) by mouth 2 (two) times daily with a meal.     metoprolol succinate 25 MG 24 hr tablet  Commonly known as:  TOPROL-XL  Take 1 tablet (25 mg total) by mouth daily. For heart and blood pressure     OMEGA 3 PO  Take 1 capsule by mouth daily. Uses Mega Red 500 mg daily     omeprazole 40 MG capsule  Commonly known as:  PRILOSEC  Take 1 capsule (40 mg total) by mouth  daily.     onetouch ultrasoft lancets  Test 1X per day and prn   Dx 250.01     oseltamivir 75 MG capsule  Commonly known as:  TAMIFLU  Take 1 capsule (75 mg total) by mouth 2 (two) times daily.     potassium chloride 10 MEQ tablet  Commonly known as:  KLOR-CON 10  Take 1 tablet (10 mEq total) by mouth daily.     Vitamin D (Ergocalciferol) 50000 units Caps capsule  Commonly known as:  DRISDOL  TAKE 1 CAPSULE EVERY 7 DAYS           Objective:    BP 119/85 mmHg  Pulse 94  Temp(Src) 100.8 F (38.2 C) (Oral)  Ht 5\' 3"  (1.6 m)  Wt 266 lb (120.657 kg)  BMI 47.13 kg/m2  LMP 01/29/2016  Wt Readings from Last 3 Encounters:  02/05/16 266 lb (120.657 kg)  02/03/16 267 lb 12.8 oz (121.473 kg)  11/14/15 285 lb (129.275 kg)    Physical Exam  Constitutional: She is oriented to person, place, and time. She appears well-developed and well-nourished. No distress.  HENT:  Right Ear: Tympanic membrane, external ear and ear canal normal.  Left Ear: Tympanic membrane, external ear and ear canal normal.  Nose: Mucosal edema and rhinorrhea present. No epistaxis. Right sinus exhibits no maxillary sinus tenderness and no frontal sinus tenderness. Left sinus exhibits no maxillary sinus tenderness and no frontal sinus tenderness.  Mouth/Throat: Uvula is midline and mucous membranes are normal. Posterior oropharyngeal edema and posterior oropharyngeal erythema present. No oropharyngeal exudate or tonsillar abscesses.  Eyes: Conjunctivae and EOM are normal.  Neck: Neck supple. No thyromegaly present.  Cardiovascular: Normal rate, regular rhythm, normal heart sounds and intact distal pulses.   No murmur heard. Pulmonary/Chest: Effort normal. No respiratory distress. She has no wheezes. She has rales (Right basilar crackles).  Musculoskeletal: Normal range of motion. She exhibits no edema or tenderness.  Lymphadenopathy:    She has no cervical adenopathy.  Neurological: She is alert and oriented to  person, place, and time. Coordination normal.  Skin: Skin is warm and dry. No rash noted. She is not diaphoretic.  Psychiatric: She has a normal mood and affect. Her behavior is normal.  Vitals reviewed.     Assessment & Plan:   Problem List Items Addressed This Visit    None    Visit Diagnoses    CAP (community acquired pneumonia)    -  Primary    Relevant Medications    cefTRIAXone (ROCEPHIN) injection 1 g (Start on 02/05/2016  2:45 PM)    fluticasone (FLONASE) 50 MCG/ACT  nasal spray    azithromycin (ZITHROMAX) 250 MG tablet       Follow up plan: Return if symptoms worsen or fail to improve.  Counseling provided for all of the vaccine components No orders of the defined types were placed in this encounter.    Caryl Pina, MD [facility] 02/05/2016, 2:31 PM

## 2016-02-05 NOTE — Telephone Encounter (Signed)
Sent inhaler, printed out Hycodan

## 2016-02-05 NOTE — Telephone Encounter (Signed)
Patient to come back by to pick up prescription for cough med

## 2016-02-05 NOTE — Addendum Note (Signed)
Addended by: Caryl Pina on: 02/05/2016 02:43 PM   Modules accepted: Orders

## 2016-02-06 ENCOUNTER — Telehealth: Payer: Self-pay | Admitting: Family Medicine

## 2016-02-06 MED ORDER — ALLOPURINOL 300 MG PO TABS: 300.0000 mg | ORAL_TABLET | Freq: Every day | ORAL | Status: DC

## 2016-02-06 MED ORDER — COLCHICINE 0.6 MG PO TABS: 0.6000 mg | ORAL_TABLET | Freq: Two times a day (BID) | ORAL | Status: DC

## 2016-02-13 ENCOUNTER — Telehealth: Payer: Self-pay | Admitting: Family Medicine

## 2016-02-13 MED ORDER — LISINOPRIL 20 MG PO TABS: 20.0000 mg | ORAL_TABLET | Freq: Every day | ORAL | Status: DC

## 2016-02-13 MED ORDER — VITAMIN D (ERGOCALCIFEROL) 1.25 MG (50000 UNIT) PO CAPS: ORAL_CAPSULE | ORAL | Status: DC

## 2016-02-13 NOTE — Telephone Encounter (Signed)
Did not pick up Metoprolol and will discuss further with [facility]

## 2016-02-29 ENCOUNTER — Other Ambulatory Visit: Payer: Self-pay

## 2016-02-29 ENCOUNTER — Telehealth: Payer: Self-pay | Admitting: Family Medicine

## 2016-02-29 MED ORDER — OMEPRAZOLE 40 MG PO CPDR: 40.0000 mg | DELAYED_RELEASE_CAPSULE | Freq: Every day | ORAL | Status: DC

## 2016-02-29 NOTE — Telephone Encounter (Signed)
done

## 2016-03-22 ENCOUNTER — Ambulatory Visit (INDEPENDENT_AMBULATORY_CARE_PROVIDER_SITE_OTHER): Payer: BLUE CROSS/BLUE SHIELD | Admitting: Family Medicine

## 2016-03-22 ENCOUNTER — Encounter: Payer: Self-pay | Admitting: Family Medicine

## 2016-03-22 VITALS — BP 113/75 | HR 86 | Temp 97.8°F | Ht 63.0 in | Wt 266.0 lb

## 2016-03-22 DIAGNOSIS — E559 Vitamin D deficiency, unspecified: Secondary | ICD-10-CM | POA: Diagnosis not present

## 2016-03-22 DIAGNOSIS — M2550 Pain in unspecified joint: Secondary | ICD-10-CM

## 2016-03-22 DIAGNOSIS — E781 Pure hyperglyceridemia: Secondary | ICD-10-CM

## 2016-03-22 DIAGNOSIS — D509 Iron deficiency anemia, unspecified: Secondary | ICD-10-CM | POA: Diagnosis not present

## 2016-03-22 DIAGNOSIS — I152 Hypertension secondary to endocrine disorders: Secondary | ICD-10-CM

## 2016-03-22 DIAGNOSIS — Z1211 Encounter for screening for malignant neoplasm of colon: Secondary | ICD-10-CM

## 2016-03-22 DIAGNOSIS — G4710 Hypersomnia, unspecified: Secondary | ICD-10-CM

## 2016-03-22 DIAGNOSIS — E1142 Type 2 diabetes mellitus with diabetic polyneuropathy: Secondary | ICD-10-CM

## 2016-03-22 DIAGNOSIS — E039 Hypothyroidism, unspecified: Secondary | ICD-10-CM | POA: Diagnosis not present

## 2016-03-22 DIAGNOSIS — E1159 Type 2 diabetes mellitus with other circulatory complications: Secondary | ICD-10-CM | POA: Diagnosis not present

## 2016-03-22 DIAGNOSIS — I10 Essential (primary) hypertension: Secondary | ICD-10-CM

## 2016-03-22 DIAGNOSIS — E119 Type 2 diabetes mellitus without complications: Secondary | ICD-10-CM

## 2016-03-22 DIAGNOSIS — R5383 Other fatigue: Secondary | ICD-10-CM

## 2016-03-22 DIAGNOSIS — K219 Gastro-esophageal reflux disease without esophagitis: Secondary | ICD-10-CM | POA: Diagnosis not present

## 2016-03-22 DIAGNOSIS — G4730 Sleep apnea, unspecified: Secondary | ICD-10-CM

## 2016-03-22 DIAGNOSIS — N926 Irregular menstruation, unspecified: Secondary | ICD-10-CM

## 2016-03-22 MED ORDER — LISINOPRIL 20 MG PO TABS: 20.0000 mg | ORAL_TABLET | Freq: Every day | ORAL | Status: DC

## 2016-03-22 NOTE — Patient Instructions (Addendum)
Continue current medications. Continue good therapeutic lifestyle changes which include good diet and exercise. Fall precautions discussed with patient. If an FOBT was given today- please return it to our front desk. If you are over 50 years old - you may need Prevnar 60 or the adult Pneumonia vaccine.  **Flu shots are available--- please call and schedule a FLU-CLINIC appointment**  After your visit with [place] today you will receive a survey in the mail or online from [employer] regarding your care with [place]. Please take a moment to fill this out. Your feedback is very important to [place] as you can help [place] better understand your patient needs as well as improve your experience and satisfaction. WE CARE ABOUT YOU!!!   We will arrange for you to come back in visit with the clinical pharmacist to discuss diabetes control and reducing metformin because of loose bowel movements--- she may want to have her triglyceride repeated fasting You should call and make an appointment with your gynecologist and take a copy of the lab work results with you from this visit to that visit because of these increased frequency of periods Continue to make every effort to lose weight and be more active physically We will also schedule a visit with the pulmonologist to reevaluate your CPAP and sleep apnea because of the increased fatigue that you are complaining with.

## 2016-03-22 NOTE — Progress Notes (Signed)
Subjective:    Patient ID: Holly Rodriguez, female    DOB: 04-23-1966, 50 y.o.   MRN: [id number]  HPI Pt here for follow up and management of chronic medical problems which includes diabetes, hypertension, and hyperlipidemia. She is taking medications regularly.The patient today complains of fatigue and exhaustion. She also is having more problems with her gout and having irregular periods. She is due to get an FOBT today and lab work today. If she is not fasting I will recommend she come back for fasting lab work because her previous triglycerides were very elevated. The patient denies any chest pain or shortness of breath. She has some nausea and loose bowel movements. The loose bowel movements have been going on for a good while. She thinks that the metformin has not made this any better. She has seen Dr. Limmie Patricia in the past because of irritable bowel syndrome has been over a year since she saw him. She denies any blood in the stool that she is aware of or any black tarry bowel movements. She is passing her water without problems. She hurts a lot in her feet and is hard for her to walk. She has had more problems with gout and she has had 3 bouts of this since February. Her irregular periods have been worse since February and taking medicine for gout. She is requesting refills on the lisinopril today. She is been given an FOBT to return.      Patient Active Problem List   Diagnosis Date Noted  . Hypertriglyceridemia 03/22/2016  . Grief at loss of child 02/03/2016  . Morbid obesity ([place]) 11/15/2014  . Colon polyps 08/03/2014  . Hemorrhoids, external 08/03/2014  . Prolapsed internal hemorrhoids, grade 1 08/03/2014  . Iron deficiency 06/22/2014  . Type 2 diabetes mellitus not at goal [facility]) 12/06/2013  . Hypertension associated with diabetes ([place]) 12/06/2013  . Acute appendicitis 10/25/2013   Outpatient Encounter Prescriptions as of 03/22/2016  Medication Sig  . albuterol (PROVENTIL  HFA;VENTOLIN HFA) 108 (90 Base) MCG/ACT inhaler Inhale 2 puffs into the lungs every 6 (six) hours as needed for wheezing or shortness of breath.  . ALPRAZolam (XANAX) 0.5 MG tablet Take 2 tablets (1 mg total) by mouth at bedtime as needed for anxiety.  . colchicine 0.6 MG tablet Take 1 tablet (0.6 mg total) by mouth 2 (two) times daily. Until symptoms clear.  . fluticasone (FLONASE) 50 MCG/ACT nasal spray Place 1 spray into both nostrils 2 (two) times daily as needed for allergies or rhinitis.  Marland Kitchen glucose blood test strip Reli-on glucometer.  Test Blood sugar qid Dx. 250.01  . hyoscyamine (LEVBID) 0.375 MG 12 hr tablet Take 1 tablet (0.375 mg total) by mouth 2 (two) times daily.  Marland Kitchen levothyroxine (SYNTHROID) 75 MCG tablet Take 1 tablet (75 mcg total) by mouth daily before breakfast.  . lisinopril (PRINIVIL,ZESTRIL) 20 MG tablet Take 1 tablet (20 mg total) by mouth daily.  . metFORMIN (GLUCOPHAGE) 500 MG tablet Take 1 tablet (500 mg total) by mouth 2 (two) times daily with a meal.  . Omega-3 Fatty Acids (OMEGA 3 PO) Take 1 capsule by mouth daily. Uses Mega Red 500 mg daily  . omeprazole (PRILOSEC) 40 MG capsule Take 1 capsule (40 mg total) by mouth daily.  . potassium chloride (KLOR-CON 10) 10 MEQ tablet Take 1 tablet (10 mEq total) by mouth daily.  . Vitamin D, Ergocalciferol, (DRISDOL) 50000 units CAPS capsule TAKE 1 CAPSULE EVERY 7 DAYS  . allopurinol (  ZYLOPRIM) 300 MG tablet Take 1 tablet (300 mg total) by mouth daily. For gout prevention (Patient not taking: Reported on 03/22/2016)  . Lancets (ONETOUCH ULTRASOFT) lancets Test 1X per day and prn   Dx 250.01  . Multiple Vitamins-Minerals (ALIVE WOMENS ENERGY PO) Take 1 tablet by mouth daily. Reported on 03/22/2016  . [DISCONTINUED] azithromycin (ZITHROMAX) 250 MG tablet Take 2 the first day and then one each day after.  . [DISCONTINUED] dicyclomine (BENTYL) 20 MG tablet Take 1 tablet (20 mg total) by mouth 4 (four) times daily -  before meals and at  bedtime.  . [DISCONTINUED] HYDROcodone-homatropine (HYCODAN) 5-1.5 MG/5ML syrup Take 5 mLs by mouth every 6 (six) hours as needed for cough.  . [DISCONTINUED] oseltamivir (TAMIFLU) 75 MG capsule Take 1 capsule (75 mg total) by mouth 2 (two) times daily.   No facility-administered encounter medications on file as of 03/22/2016.     Review of Systems  Constitutional: Positive for fatigue.  HENT: Negative.   Eyes: Negative.   Respiratory: Negative.   Cardiovascular: Negative.   Gastrointestinal: Negative.   Endocrine: Negative.   Genitourinary: Positive for menstrual problem (irregular).  Musculoskeletal: Positive for arthralgias.       Gout flare  Skin: Negative.   Allergic/Immunologic: Negative.   Neurological: Negative.   Hematological: Negative.   Psychiatric/Behavioral: Negative.        Objective:   Physical Exam  Constitutional: She is oriented to person, place, and time. She appears well-developed and well-nourished. No distress.  HENT:  Head: Normocephalic and atraumatic.  Right Ear: External ear normal.  Left Ear: External ear normal.  Nose: Nose normal.  Mouth/Throat: Oropharynx is clear and moist.  Eyes: Conjunctivae and EOM are normal. Pupils are equal, round, and reactive to light. Right eye exhibits no discharge. Left eye exhibits no discharge. No scleral icterus.  Neck: Normal range of motion. Neck supple. No thyromegaly present.  Without bruits or thyromegaly  Cardiovascular: Normal rate, regular rhythm, normal heart sounds and intact distal pulses.   No murmur heard. The heart has a regular rate and rhythm at 84/m  Pulmonary/Chest: Effort normal and breath sounds normal. No respiratory distress. She has no wheezes. She has no rales. She exhibits no tenderness.  Clear anteriorly and posteriorly  Abdominal: Soft. Bowel sounds are normal. She exhibits no mass. There is tenderness. There is no rebound and no guarding.  Morbid obesity and generalized abdominal  tenderness in the upper abdomen and over the scar tissue midline where she has had previous bypass surgery. No masses palpable.  Musculoskeletal: Normal range of motion. She exhibits no edema or tenderness.  Lymphadenopathy:    She has no cervical adenopathy.  Neurological: She is alert and oriented to person, place, and time. She has normal reflexes. No cranial nerve deficit.  Skin: Skin is warm and dry. No rash noted.  Psychiatric: She has a normal mood and affect. Her behavior is normal. Judgment and thought content normal.  Nursing note and vitals reviewed.   BP 113/75 mmHg  Pulse 86  Temp(Src) 97.8 F (36.6 C) (Oral)  Ht 5' 3"  (1.6 m)  Wt 266 lb (120.657 kg)  BMI 47.13 kg/m2  LMP 03/22/2016       Assessment & Plan:  1. Diabetic polyneuropathy associated with type 2 diabetes mellitus ([place]) -Appointment with clinical pharmacy to follow up on blood sugar control and loose bowel movements which could be attributed to metformin - BMP8+EGFR  2. Hypothyroidism, unspecified hypothyroidism type -Continue current treatment pending  results of lab work - Thyroid Panel With TSH  3. Type 2 diabetes mellitus not at goal [facility]) -For now continue current treatment pending results of lab work - BMP8+EGFR - Lipid panel  4. Hypertension associated with diabetes ([place]) -The blood pressure is good today and she will continue with current treatment and sodium restriction - BMP8+EGFR - Hepatic function panel - Lipid panel  5. Vitamin D deficiency -Continue with vitamin D replacement - VITAMIN D 25 Hydroxy (Vit-D Deficiency, Fractures)  6. Gastroesophageal reflux disease, esophagitis presence not specified -Continue with omeprazole - Hepatic function panel  7. Anemia, iron deficiency -Continue with multivitamins - Anemia Profile B  8. Special screening for malignant neoplasms, colon - Fecal occult blood, imunochemical; Future  9. Hypertriglyceridemia -Consider treatment once a  fasting triglyceride is obtained along with aggressive therapeutic lifestyle changes  10. Morbid obesity due to excess calories ([place]) -Continue with aggressive therapeutic lifestyle changes including diet and exercise  11. Other fatigue -Have CPAP and sleep apnea reevaluated - Urinalysis, Complete - Urine culture - Uric acid - AMENORRHEA PROFILE  12. Arthralgia - Uric acid  13. Irregular menstrual cycle -The patient will call and make an appointment with her gynecologist and take a copy of the lab work results from our office to that visit - [place]  14. Hypersomnia with sleep apnea -Reevaluate sleep apnea and CPAP with pulmonologist--- we will make patient appointment for this.  Meds ordered this encounter  Medications  . lisinopril (PRINIVIL,ZESTRIL) 20 MG tablet    Sig: Take 1 tablet (20 mg total) by mouth daily.    Dispense:  90 tablet    Refill:  3   Patient Instructions  Continue current medications. Continue good therapeutic lifestyle changes which include good diet and exercise. Fall precautions discussed with patient. If an FOBT was given today- please return it to our front desk. If you are over 78 years old - you may need Prevnar 32 or the adult Pneumonia vaccine.  **Flu shots are available--- please call and schedule a FLU-CLINIC appointment**  After your visit with [place] today you will receive a survey in the mail or online from [employer] regarding your care with [place]. Please take a moment to fill this out. Your feedback is very important to [place] as you can help [place] better understand your patient needs as well as improve your experience and satisfaction. WE CARE ABOUT YOU!!!   We will arrange for you to come back in visit with the clinical pharmacist to discuss diabetes control and reducing metformin because of loose bowel movements--- she may want to have her triglyceride repeated fasting You should call and make an appointment with your gynecologist and  take a copy of the lab work results with you from this visit to that visit because of these increased frequency of periods Continue to make every effort to lose weight and be more active physically We will also schedule a visit with the pulmonologist to reevaluate your CPAP and sleep apnea because of the increased fatigue that you are complaining with.   Arrie Senate MD

## 2016-03-23 LAB — ANEMIA PROFILE B
BASOS: 1 %
Basophils Absolute: 0.1 10*3/uL (ref 0.0–0.2)
EOS (ABSOLUTE): 0.3 10*3/uL (ref 0.0–0.4)
EOS: 2 %
FERRITIN: 69 ng/mL (ref 15–150)
FOLATE: 7 ng/mL (ref >3.0)
HEMATOCRIT: 35.6 % (ref 34.0–46.6)
HEMOGLOBIN: 11.9 g/dL (ref 11.1–15.9)
IRON SATURATION: 16 % (ref 15–55)
Immature Grans (Abs): 0.1 10*3/uL (ref 0.0–0.1)
Immature Granulocytes: 1 %
Iron: 58 ug/dL (ref 27–159)
LYMPHS ABS: 5.2 10*3/uL — AB (ref 0.7–3.1)
Lymphs: 36 %
MCH: 27.9 pg (ref 26.6–33.0)
MCHC: 33.4 g/dL (ref 31.5–35.7)
MCV: 84 fL (ref 79–97)
MONOS ABS: 0.8 10*3/uL (ref 0.1–0.9)
Monocytes: 6 %
NEUTROS ABS: 7.9 10*3/uL — AB (ref 1.4–7.0)
NEUTROS PCT: 54 %
Platelets: 359 10*3/uL (ref 150–379)
RBC: 4.26 x10E6/uL (ref 3.77–5.28)
RDW: 14.8 % (ref 12.3–15.4)
RETIC CT PCT: 1.6 % (ref 0.6–2.6)
Total Iron Binding Capacity: 372 ug/dL (ref 250–450)
UIBC: 314 ug/dL (ref 131–425)
VITAMIN B 12: 625 pg/mL (ref 211–946)
WBC: 14.5 10*3/uL — ABNORMAL HIGH (ref 3.4–10.8)

## 2016-03-23 LAB — HEPATIC FUNCTION PANEL
ALBUMIN: 4.5 g/dL (ref 3.5–5.5)
ALK PHOS: 91 IU/L (ref 39–117)
ALT: 18 IU/L (ref 0–32)
AST: 15 IU/L (ref 0–40)
BILIRUBIN, DIRECT: 0.1 mg/dL (ref 0.00–0.40)
Bilirubin Total: 0.2 mg/dL (ref 0.0–1.2)
TOTAL PROTEIN: 7.5 g/dL (ref 6.0–8.5)

## 2016-03-23 LAB — BMP8+EGFR
BUN / CREAT RATIO: 22 (ref 9–23)
BUN: 17 mg/dL (ref 6–24)
CHLORIDE: 99 mmol/L (ref 96–106)
CO2: 18 mmol/L (ref 18–29)
CREATININE: 0.79 mg/dL (ref 0.57–1.00)
Calcium: 9.5 mg/dL (ref 8.7–10.2)
GFR calc Af Amer: 102 mL/min/{1.73_m2} (ref >59)
GFR calc non Af Amer: 88 mL/min/{1.73_m2} (ref >59)
Glucose: 88 mg/dL (ref 65–99)
Potassium: 4.6 mmol/L (ref 3.5–5.2)
Sodium: 137 mmol/L (ref 134–144)

## 2016-03-23 LAB — LIPID PANEL
CHOL/HDL RATIO: 5.1 ratio — AB (ref 0.0–4.4)
CHOLESTEROL TOTAL: 180 mg/dL (ref 100–199)
HDL: 35 mg/dL — ABNORMAL LOW (ref >39)
LDL CALC: 85 mg/dL (ref 0–99)
TRIGLYCERIDES: 298 mg/dL — AB (ref 0–149)
VLDL CHOLESTEROL CAL: 60 mg/dL — AB (ref 5–40)

## 2016-03-23 LAB — THYROID PANEL WITH TSH
Free Thyroxine Index: 2.7 (ref 1.2–4.9)
T3 Uptake Ratio: 26 % (ref 24–39)
T4 TOTAL: 10.3 ug/dL (ref 4.5–12.0)
TSH: 2.3 u[IU]/mL (ref 0.450–4.500)

## 2016-03-23 LAB — VITAMIN D 25 HYDROXY (VIT D DEFICIENCY, FRACTURES): Vit D, 25-Hydroxy: 34.2 ng/mL (ref 30.0–100.0)

## 2016-03-23 LAB — URIC ACID: Uric Acid: 9.4 mg/dL — ABNORMAL HIGH (ref 2.5–7.1)

## 2016-03-23 LAB — AMENORRHEA PROFILE
FSH: 5.9 m[IU]/mL
LH: 4.5 m[IU]/mL
Prolactin: 11.4 ng/mL (ref 4.8–23.3)

## 2016-04-01 ENCOUNTER — Other Ambulatory Visit: Payer: BLUE CROSS/BLUE SHIELD

## 2016-04-01 DIAGNOSIS — D72829 Elevated white blood cell count, unspecified: Secondary | ICD-10-CM

## 2016-04-02 LAB — CBC WITH DIFFERENTIAL/PLATELET
BASOS ABS: 0.1 10*3/uL (ref 0.0–0.2)
Basos: 1 %
EOS (ABSOLUTE): 0.4 10*3/uL (ref 0.0–0.4)
Eos: 3 %
HEMOGLOBIN: 12.2 g/dL (ref 11.1–15.9)
Hematocrit: 36.8 % (ref 34.0–46.6)
Immature Grans (Abs): 0 10*3/uL (ref 0.0–0.1)
Immature Granulocytes: 0 %
LYMPHS ABS: 4.6 10*3/uL — AB (ref 0.7–3.1)
Lymphs: 38 %
MCH: 28.4 pg (ref 26.6–33.0)
MCHC: 33.2 g/dL (ref 31.5–35.7)
MCV: 86 fL (ref 79–97)
MONOS ABS: 0.7 10*3/uL (ref 0.1–0.9)
Monocytes: 5 %
NEUTROS ABS: 6.3 10*3/uL (ref 1.4–7.0)
Neutrophils: 53 %
Platelets: 350 10*3/uL (ref 150–379)
RBC: 4.29 x10E6/uL (ref 3.77–5.28)
RDW: 14.9 % (ref 12.3–15.4)
WBC: 12.1 10*3/uL — AB (ref 3.4–10.8)

## 2016-04-11 ENCOUNTER — Encounter: Payer: Self-pay | Admitting: Pharmacist

## 2016-04-11 ENCOUNTER — Ambulatory Visit (INDEPENDENT_AMBULATORY_CARE_PROVIDER_SITE_OTHER): Payer: BLUE CROSS/BLUE SHIELD | Admitting: Pharmacist

## 2016-04-11 VITALS — Ht 63.0 in | Wt 265.0 lb

## 2016-04-11 DIAGNOSIS — E781 Pure hyperglyceridemia: Secondary | ICD-10-CM

## 2016-04-11 DIAGNOSIS — E669 Obesity, unspecified: Secondary | ICD-10-CM

## 2016-04-11 DIAGNOSIS — E1140 Type 2 diabetes mellitus with diabetic neuropathy, unspecified: Secondary | ICD-10-CM

## 2016-04-11 LAB — BAYER DCA HB A1C WAIVED: HB A1C (BAYER DCA - WAIVED): 6.6 % (ref <7.0)

## 2016-04-11 MED ORDER — EMPAGLIFLOZIN 25 MG PO TABS: 25.0000 mg | ORAL_TABLET | Freq: Every day | ORAL | Status: DC

## 2016-04-11 NOTE — Patient Instructions (Signed)
Diabetes and Standards of Medical Care   Diabetes is complicated. You may find that your diabetes team includes a dietitian, nurse, diabetes educator, eye doctor, and more. To help everyone know what is going on and to help you get the care you deserve, the following schedule of care was developed to help keep you on track. Below are the tests, exams, vaccines, medicines, education, and plans you will need.  Blood Glucose Goals Prior to meals = 80 - 130 Within 2 hours of the start of a meal = less than 180  HbA1c test (goal is less than 7.0% - your last value was 6.6%) This test shows how well you have controlled your glucose over the past 2 to 3 months. It is used to see if your diabetes management plan needs to be adjusted.   It is performed at least 2 times a year if you are meeting treatment goals.  It is performed 4 times a year if therapy has changed or if you are not meeting treatment goals.  Blood pressure test  This test is performed at every routine medical visit. The goal is less than 140/90 mmHg for most people, but 130/80 mmHg in some cases. Ask your health care provider about your goal.  Dental exam  Follow up with the dentist regularly.  Eye exam  If you are diagnosed with type 1 diabetes as a child, get an exam upon reaching the age of 39 years or older and have had diabetes for 3 to 5 years. Yearly eye exams are recommended after that initial eye exam.  If you are diagnosed with type 1 diabetes as an adult, get an exam within 5 years of diagnosis and then yearly.  If you are diagnosed with type 2 diabetes, get an exam as soon as possible after the diagnosis and then yearly.  Foot care exam  Visual foot exams are performed at every routine medical visit. The exams check for cuts, injuries, or other problems with the feet.  A comprehensive foot exam should be done yearly. This includes visual inspection as well as assessing foot pulses and testing for loss of  sensation.  Check your feet nightly for cuts, injuries, or other problems with your feet. Tell your health care provider if anything is not healing.  Kidney function test (urine microalbumin)  This test is performed once a year.  Type 1 diabetes: The first test is performed 5 years after diagnosis.  Type 2 diabetes: The first test is performed at the time of diagnosis.  A serum creatinine and estimated glomerular filtration rate (eGFR) test is done once a year to assess the level of chronic kidney disease (CKD), if present.  Lipid profile (cholesterol, HDL, LDL, triglycerides)  Performed every 5 years for most people.  The goal for LDL is less than 100 mg/dL. If you are at high risk, the goal is less than 70 mg/dL.  The goal for HDL is 40 mg/dL to 50 mg/dL for men and 50 mg/dL to 60 mg/dL for women. An HDL cholesterol of 60 mg/dL or higher gives some protection against heart disease.  The goal for triglycerides is less than 150 mg/dL.  Influenza vaccine, pneumococcal vaccine, and hepatitis B vaccine  The influenza vaccine is recommended yearly.  The pneumococcal vaccine is generally given once in a lifetime. However, there are some instances when another vaccination is recommended. Check with your health care provider.  The hepatitis B vaccine is also recommended for adults with diabetes.  Diabetes self-management education  Education is recommended at diagnosis and ongoing as needed.  Treatment plan  Your treatment plan is reviewed at every medical visit.  Document Released: 09/01/2009 Document Revised: 07/07/2013 Document Reviewed: 04/06/2013 Exit[employer] Patient Information 2014 [employer].   

## 2016-04-11 NOTE — Progress Notes (Signed)
Patient ID: Holly Rodriguez, female   DOB: January 27, 1966, 50 y.o.   MRN: [id number] Subjective:    Holly Rodriguez is a 50 y.o. female who presents for an initial evaluation of Type 2 diabetes mellitus, hypertriglyceridemia and medications management.   The patient was initially diagnosed with Type 2 diabetes mellitus Known diabetic complications: peripheral neuropathy Cardiovascular risk factors: diabetes mellitus, dyslipidemia, hypertension, obesity (BMI >= 30 kg/m2) and sedentary lifestyle Current diabetic medications include -  metformin 500mg  bid.  She states she has experienced loose stool since she started metformin.  Until insurance formulary changed she took metformin XR and it was better but she still experienced loose stool with XR.   Eye exam current (within one year): yes Weight trend: stable Prior visit with CDE: yes -  But has been about 2 years ago Current diet: trying to eat less beef due to gout  Breakfast - egg sandwich or special K cereal and Vanilla almond milk  Lunch - usually skips  Dinner - more vegetables lately; chicken, greens. Current exercise: none  Current monitoring regimen: home blood tests - about one time daily Home blood sugar records: fasting range: 150 - 180 Any episodes of hypoglycemia? no  Is She on ACE inhibitor or angiotensin II receptor blocker?  Yes  lisinopril (Prinivil)  Current Outpatient Prescriptions on File Prior to Visit  Medication Sig Dispense Refill  . albuterol (PROVENTIL HFA;VENTOLIN HFA) 108 (90 Base) MCG/ACT inhaler Inhale 2 puffs into the lungs every 6 (six) hours as needed for wheezing or shortness of breath. 1 Inhaler 2  . allopurinol (ZYLOPRIM) 300 MG tablet Take 1 tablet (300 mg total) by mouth daily. For gout prevention (Patient not taking: Reported on 03/22/2016) 30 tablet 6  . ALPRAZolam (XANAX) 0.5 MG tablet Take 2 tablets (1 mg total) by mouth at bedtime as needed for anxiety. 90 tablet 1  . colchicine 0.6 MG tablet Take 1 tablet  (0.6 mg total) by mouth 2 (two) times daily. Until symptoms clear. 60 tablet 2  . fluticasone (FLONASE) 50 MCG/ACT nasal spray Place 1 spray into both nostrils 2 (two) times daily as needed for allergies or rhinitis. 16 g 6  . glucose blood test strip Reli-on glucometer.  Test Blood sugar qid Dx. 250.01    . hyoscyamine (LEVBID) 0.375 MG 12 hr tablet Take 1 tablet (0.375 mg total) by mouth 2 (two) times daily. 180 tablet 3  . Lancets (ONETOUCH ULTRASOFT) lancets Test 1X per day and prn   Dx 250.01 100 each 12  . levothyroxine (SYNTHROID) 75 MCG tablet Take 1 tablet (75 mcg total) by mouth daily before breakfast. 90 tablet 3  . lisinopril (PRINIVIL,ZESTRIL) 20 MG tablet Take 1 tablet (20 mg total) by mouth daily. 90 tablet 3  . metFORMIN (GLUCOPHAGE) 500 MG tablet Take 1 tablet (500 mg total) by mouth 2 (two) times daily with a meal. 180 tablet 3  . Multiple Vitamins-Minerals (ALIVE WOMENS ENERGY PO) Take 1 tablet by mouth daily. Reported on 03/22/2016    . Omega-3 Fatty Acids (OMEGA 3 PO) Take 1 capsule by mouth daily. Uses Mega Red 500 mg daily    . omeprazole (PRILOSEC) 40 MG capsule Take 1 capsule (40 mg total) by mouth daily. 90 capsule 1  . potassium chloride (KLOR-CON 10) 10 MEQ tablet Take 1 tablet (10 mEq total) by mouth daily. 90 tablet 3  . Vitamin D, Ergocalciferol, (DRISDOL) 50000 units CAPS capsule TAKE 1 CAPSULE EVERY 7 DAYS 12 capsule 1  No current facility-administered medications on file prior to visit.    The following portions of the patient's history were reviewed and updated as appropriate: allergies, current medications, past family history, past medical history, past social history, past surgical history and problem list.  Review of Systems Review of Systems  Constitutional: Positive for malaise/fatigue.  Eyes: Negative.   Respiratory: Negative.   Cardiovascular: Negative.   Gastrointestinal: Positive for diarrhea.  Genitourinary: Negative.   Musculoskeletal: Positive  for joint pain.  Skin: Negative.   Neurological: Positive for tingling.  Endo/Heme/Allergies: Negative.   Psychiatric/Behavioral: Negative.      Objective:    LMP 03/22/2016   A1c = 6.6 today   Lab Review GLUCOSE (mg/dL)  Date Value  03/22/2016 88  02/03/2016 108*  12/02/2015 132*   GLUCOSE, BLD (mg/dL)  Date Value  06/06/2014 90  10/26/2013 179*  10/25/2013 155*   CO2 (mmol/L)  Date Value  03/22/2016 18  02/03/2016 21  12/02/2015 22   BUN (mg/dL)  Date Value  03/22/2016 17  02/03/2016 12  12/02/2015 20  06/06/2014 15  10/26/2013 8  10/25/2013 8   CREAT (mg/dL)  Date Value  02/01/2013 0.67   CREATININE, SER (mg/dL)  Date Value  03/22/2016 0.79  02/03/2016 0.79  12/02/2015 0.88     Assessment:    Diabetes Mellitus type II, under good control.   Loose stools - possibly related to metformin Obesity Gout Hypertriglyceridemia    Plan:    1.  Rx changes: discontinue metformin due to loose stools   Start Jardiance 25mg  take 1 tablet daily 2.  Education: Reviewed 'ABCs' of diabetes management (respective goals in parentheses):  A1C (<7), blood pressure (<130/80), and cholesterol (LDL <100). 3.  Orders Placed This Encounter  Procedures  . Bayer DCA Hb A1c Waived  . Lipid panel  . Microalbumin / creatinine urine ratio    4. CHO counting diet discussed.  Also discussed limiting hugh fructose corn syrup and meats due to gout.  Reviewed CHO amount in various foods and how to read nutrition labels.  Discussed recommended serving sizes.  5.  Recommended increase physical activity - goal is 150 minutes per week but start with just 10 minutes daily and work up as able 6. Follow up: 4 weeks - check BMET and BG control

## 2016-04-12 LAB — LIPID PANEL
Chol/HDL Ratio: 5.2 ratio units — ABNORMAL HIGH (ref 0.0–4.4)
Cholesterol, Total: 187 mg/dL (ref 100–199)
HDL: 36 mg/dL — AB (ref >39)
LDL Calculated: 82 mg/dL (ref 0–99)
TRIGLYCERIDES: 347 mg/dL — AB (ref 0–149)
VLDL CHOLESTEROL CAL: 69 mg/dL — AB (ref 5–40)

## 2016-04-12 LAB — MICROALBUMIN / CREATININE URINE RATIO
CREATININE, UR: 144.5 mg/dL
MICROALB/CREAT RATIO: 3.7 mg/g creat (ref 0.0–30.0)
Microalbumin, Urine: 5.4 ug/mL

## 2016-04-16 ENCOUNTER — Other Ambulatory Visit: Payer: Self-pay | Admitting: Pharmacist

## 2016-05-14 ENCOUNTER — Ambulatory Visit: Payer: Self-pay | Admitting: Pharmacist

## 2016-05-23 ENCOUNTER — Telehealth: Payer: Self-pay | Admitting: Pharmacist

## 2016-05-23 ENCOUNTER — Telehealth: Payer: Self-pay | Admitting: Family Medicine

## 2016-05-23 MED ORDER — ALLOPURINOL 300 MG PO TABS: 300.0000 mg | ORAL_TABLET | Freq: Every day | ORAL | Status: DC

## 2016-05-23 MED ORDER — OMEPRAZOLE 40 MG PO CPDR: 40.0000 mg | DELAYED_RELEASE_CAPSULE | Freq: Every day | ORAL | Status: DC

## 2016-05-23 MED ORDER — VITAMIN D (ERGOCALCIFEROL) 1.25 MG (50000 UNIT) PO CAPS: ORAL_CAPSULE | ORAL | Status: DC

## 2016-05-23 NOTE — Telephone Encounter (Signed)
Ran out of jardiance yesterday.  Has appt next week.  Gave #7 more Jardiance 25mg  qd.  Also recommended she try 1/2 tablet of metformin 500mg  once daily with food to see if this will help bring BG down.

## 2016-05-23 NOTE — Telephone Encounter (Signed)
Pt aware - meds sent to pharm

## 2016-05-30 ENCOUNTER — Ambulatory Visit (INDEPENDENT_AMBULATORY_CARE_PROVIDER_SITE_OTHER): Payer: BLUE CROSS/BLUE SHIELD | Admitting: Pharmacist

## 2016-05-30 ENCOUNTER — Encounter: Payer: Self-pay | Admitting: Pharmacist

## 2016-05-30 VITALS — BP 112/80 | HR 68 | Ht 63.0 in | Wt 273.0 lb

## 2016-05-30 DIAGNOSIS — D649 Anemia, unspecified: Secondary | ICD-10-CM | POA: Diagnosis not present

## 2016-05-30 DIAGNOSIS — E119 Type 2 diabetes mellitus without complications: Secondary | ICD-10-CM

## 2016-05-30 NOTE — Patient Instructions (Signed)
Try increasing metformin 1 tablet daily with food Continue Jardiance '25mg'$  take 1 tablet daily  Diabetes and Standards of Medical Care   Diabetes is complicated. You may find that your diabetes team includes a dietitian, nurse, diabetes educator, eye doctor, and more. To help everyone know what is going on and to help you get the care you deserve, the following schedule of care was developed to help keep you on track. Below are the tests, exams, vaccines, medicines, education, and plans you will need.  Blood Glucose Goals Prior to meals = 80 - 130 Within 2 hours of the start of a meal = less than 180  HbA1c test (goal is less than 7.0% - your last value was 6.6%) This test shows how well you have controlled your glucose over the past 2 to 3 months. It is used to see if your diabetes management plan needs to be adjusted.   It is performed at least 2 times a year if you are meeting treatment goals.  It is performed 4 times a year if therapy has changed or if you are not meeting treatment goals.  Blood pressure test  This test is performed at every routine medical visit. The goal is less than 140/90 mmHg for most people, but 130/80 mmHg in some cases. Ask your health care provider about your goal.  Dental exam  Follow up with the dentist regularly.  Eye exam  If you are diagnosed with type 1 diabetes as a child, get an exam upon reaching the age of 32 years or older and have had diabetes for 3 to 5 years. Yearly eye exams are recommended after that initial eye exam.  If you are diagnosed with type 1 diabetes as an adult, get an exam within 5 years of diagnosis and then yearly.  If you are diagnosed with type 2 diabetes, get an exam as soon as possible after the diagnosis and then yearly.  Foot care exam  Visual foot exams are performed at every routine medical visit. The exams check for cuts, injuries, or other problems with the feet.  A comprehensive foot exam should be done  yearly. This includes visual inspection as well as assessing foot pulses and testing for loss of sensation.  Check your feet nightly for cuts, injuries, or other problems with your feet. Tell your health care provider if anything is not healing.  Kidney function test (urine microalbumin)  This test is performed once a year.  Type 1 diabetes: The first test is performed 5 years after diagnosis.  Type 2 diabetes: The first test is performed at the time of diagnosis.  A serum creatinine and estimated glomerular filtration rate (eGFR) test is done once a year to assess the level of chronic kidney disease (CKD), if present.  Lipid profile (cholesterol, HDL, LDL, triglycerides)  Performed every 5 years for most people.  The goal for LDL is less than 100 mg/dL. If you are at high risk, the goal is less than 70 mg/dL.  The goal for HDL is 40 mg/dL to 50 mg/dL for men and 50 mg/dL to 60 mg/dL for women. An HDL cholesterol of 60 mg/dL or higher gives some protection against heart disease.  The goal for triglycerides is less than 150 mg/dL.  Influenza vaccine, pneumococcal vaccine, and hepatitis B vaccine  The influenza vaccine is recommended yearly.  The pneumococcal vaccine is generally given once in a lifetime. However, there are some instances when another vaccination is recommended. Check with your  health care provider.  The hepatitis B vaccine is also recommended for adults with diabetes.  Diabetes self-management education  Education is recommended at diagnosis and ongoing as needed.  Treatment plan  Your treatment plan is reviewed at every medical visit.  Document Released: 09/01/2009 Document Revised: 07/07/2013 Document Reviewed: 04/06/2013 [facility] Patient Information 2014 [place].

## 2016-05-30 NOTE — Progress Notes (Signed)
Patient ID: Holly Rodriguez, female   DOB: 1966/09/25, 50 y.o.   MRN: [id number] Subjective:    Holly Rodriguez is a 50 y.o. female who presents for re-evaluation of Type 2 diabetes mellitus.  Current diabetic medications include -  Jardiance 7m 1 tablet daily.  Just retried metformin 5025m1/2 tablet daily starting 1 week ago - is tolerating well this time w/o loose stools Tried larger doses of metformin in past and experienced loose stools even with XR form.  Eye exam current (within one year): yes Weight trend: stable Prior visit with CDE: yes Current diet: trying to eat less beef due to gout  Breakfast - egg sandwich or special K cereal and Vanilla almond milk  Lunch - usually skips or sometimes cucumber  Dinner - more vegetables lately but likes to add cheese; chicken, greens. Limiting red meat and bread Current exercise: none  Current monitoring regimen: home blood tests - about one time daily Home blood sugar records: fasting range: 180 - 240 Any episodes of hypoglycemia? no  Is She on ACE inhibitor or angiotensin II receptor blocker?  Yes  lisinopril (Prinivil)  Current Outpatient Prescriptions on File Prior to Visit  Medication Sig Dispense Refill  . albuterol (PROVENTIL HFA;VENTOLIN HFA) 108 (90 Base) MCG/ACT inhaler Inhale 2 puffs into the lungs every 6 (six) hours as needed for wheezing or shortness of breath. 1 Inhaler 2  . allopurinol (ZYLOPRIM) 300 MG tablet Take 1 tablet (300 mg total) by mouth daily. For gout prevention 90 tablet 3  . ALPRAZolam (XANAX) 0.5 MG tablet Take 2 tablets (1 mg total) by mouth at bedtime as needed for anxiety. 90 tablet 1  . colchicine 0.6 MG tablet Take 1 tablet (0.6 mg total) by mouth 2 (two) times daily. Until symptoms clear. 60 tablet 2  . empagliflozin (JARDIANCE) 25 MG TABS tablet Take 25 mg by mouth daily. 90 tablet 1  . glucose blood test strip Reli-on glucometer.  Test Blood sugar qid Dx. 250.01    . hyoscyamine (LEVBID) 0.375 MG 12 hr  tablet Take 1 tablet (0.375 mg total) by mouth 2 (two) times daily. 180 tablet 3  . Lancets (ONETOUCH ULTRASOFT) lancets Test 1X per day and prn   Dx 250.01 100 each 12  . levothyroxine (SYNTHROID) 75 MCG tablet Take 1 tablet (75 mcg total) by mouth daily before breakfast. 90 tablet 3  . lisinopril (PRINIVIL,ZESTRIL) 20 MG tablet Take 1 tablet (20 mg total) by mouth daily. 90 tablet 3  . Multiple Vitamins-Minerals (ALIVE WOMENS ENERGY PO) Take 1 tablet by mouth daily. Reported on 03/22/2016    . Omega-3 Fatty Acids (OMEGA 3 PO) Take 4 capsules by mouth daily.    . Marland Kitchenmeprazole (PRILOSEC) 40 MG capsule Take 1 capsule (40 mg total) by mouth daily. 90 capsule 3  . potassium chloride (KLOR-CON 10) 10 MEQ tablet Take 1 tablet (10 mEq total) by mouth daily. 90 tablet 3  . Vitamin D, Ergocalciferol, (DRISDOL) 50000 units CAPS capsule TAKE 1 CAPSULE EVERY 7 DAYS 12 capsule 1   No current facility-administered medications on file prior to visit.    The following portions of the patient's history were reviewed and updated as appropriate: allergies, current medications, past family history, past medical history, past social history, past surgical history and problem list.  Review of Systems Review of Systems  Constitutional: Positive for malaise/fatigue.  Eyes: Negative.   Respiratory: Negative.   Cardiovascular: Negative.   Genitourinary: Negative.   Musculoskeletal: Positive for  joint pain.  Skin: Negative.   Neurological: Positive for tingling.  Endo/Heme/Allergies: Negative.   Psychiatric/Behavioral: Negative.      Objective:    BP 112/80 mmHg  Pulse 68  Ht 5' 3"  (1.6 m)  Wt 273 lb (123.832 kg)  BMI 48.37 kg/m2   A1c = 6.6 (04/11/2016) RBG in office today was 140  Lab Review GLUCOSE (mg/dL)  Date Value  03/22/2016 88  02/03/2016 108*  12/02/2015 132*   GLUCOSE, BLD (mg/dL)  Date Value  06/06/2014 90  10/26/2013 179*  10/25/2013 155*   CO2 (mmol/L)  Date Value  03/22/2016  18  02/03/2016 21  12/02/2015 22   BUN (mg/dL)  Date Value  03/22/2016 17  02/03/2016 12  12/02/2015 20  06/06/2014 15  10/26/2013 8  10/25/2013 8   CREAT (mg/dL)  Date Value  02/01/2013 0.67   CREATININE, SER (mg/dL)  Date Value  03/22/2016 0.79  02/03/2016 0.79  12/02/2015 0.88     Assessment:    Diabetes Mellitus type II - HBG readings have increased since change from metformin to jardiance but is tolerating retrial of metformin at lower dose. Loose stools - resolved Obesity - weight has increased.  Low HGB    Plan:    1.  Rx changes:   Increase metformin to 552m qd  Continue Jardiance 241mtake 1 tablet daily 2.  Education: Reviewed 'ABCs' of diabetes management (respective goals in parentheses):  A1C (<7), blood pressure (<130/80), and cholesterol (LDL <100). 3.  Orders Placed This Encounter  Procedures  . BMP8+EGFR  . CBC with Differential/Platelet    4. CHO counting diet discussed.   Reviewed CHO amount in various foods.  DIet plan given and discussed that will help with weight and BG.  5.  Encouraged increase physical activity - goal is 150 minutes per week but start with 10 minutes daily and work up as able 6. Follow up: 6 weeks - PCP  TaCherre RobinsPharmD, CPP

## 2016-05-31 ENCOUNTER — Telehealth: Payer: Self-pay | Admitting: *Deleted

## 2016-05-31 LAB — BMP8+EGFR
BUN / CREAT RATIO: 21 (ref 9–23)
BUN: 21 mg/dL (ref 6–24)
CO2: 23 mmol/L (ref 18–29)
CREATININE: 0.99 mg/dL (ref 0.57–1.00)
Calcium: 9.6 mg/dL (ref 8.7–10.2)
Chloride: 97 mmol/L (ref 96–106)
GFR calc Af Amer: 77 mL/min/{1.73_m2} (ref >59)
GFR, EST NON AFRICAN AMERICAN: 67 mL/min/{1.73_m2} (ref >59)
GLUCOSE: 130 mg/dL — AB (ref 65–99)
Potassium: 4.2 mmol/L (ref 3.5–5.2)
Sodium: 137 mmol/L (ref 134–144)

## 2016-05-31 LAB — CBC WITH DIFFERENTIAL/PLATELET
BASOS: 1 %
Basophils Absolute: 0.1 10*3/uL (ref 0.0–0.2)
EOS (ABSOLUTE): 0.4 10*3/uL (ref 0.0–0.4)
EOS: 3 %
HEMATOCRIT: 37.1 % (ref 34.0–46.6)
HEMOGLOBIN: 12.5 g/dL (ref 11.1–15.9)
IMMATURE GRANULOCYTES: 0 %
Immature Grans (Abs): 0 10*3/uL (ref 0.0–0.1)
LYMPHS ABS: 4.3 10*3/uL — AB (ref 0.7–3.1)
Lymphs: 38 %
MCH: 28.2 pg (ref 26.6–33.0)
MCHC: 33.7 g/dL (ref 31.5–35.7)
MCV: 84 fL (ref 79–97)
MONOCYTES: 6 %
Monocytes Absolute: 0.7 10*3/uL (ref 0.1–0.9)
Neutrophils Absolute: 5.8 10*3/uL (ref 1.4–7.0)
Neutrophils: 52 %
Platelets: 292 10*3/uL (ref 150–379)
RBC: 4.43 x10E6/uL (ref 3.77–5.28)
RDW: 15.8 % — ABNORMAL HIGH (ref 12.3–15.4)
WBC: 11.2 10*3/uL — AB (ref 3.4–10.8)

## 2016-05-31 NOTE — Telephone Encounter (Signed)
Pt notified of results Verbalizes understanding 

## 2016-05-31 NOTE — Telephone Encounter (Signed)
-----   Message from [place], [place] sent at 05/31/2016  7:43 AM EDT ----- BG was slightly elevated - metformin already increased at appt 05/30/16 Serum creatinine stable - continue Jardiance 25mg  daily HBG was normal.  WBC continue to decrease Recheck BMET and CBC at appt in September with PCP

## 2016-06-18 ENCOUNTER — Encounter: Payer: Self-pay | Admitting: Pulmonary Disease

## 2016-06-18 ENCOUNTER — Ambulatory Visit (INDEPENDENT_AMBULATORY_CARE_PROVIDER_SITE_OTHER): Payer: BLUE CROSS/BLUE SHIELD | Admitting: Pulmonary Disease

## 2016-06-18 VITALS — BP 118/86 | HR 76 | Ht 63.0 in | Wt 272.0 lb

## 2016-06-18 DIAGNOSIS — Z6841 Body Mass Index (BMI) 40.0 and over, adult: Secondary | ICD-10-CM

## 2016-06-18 DIAGNOSIS — G4733 Obstructive sleep apnea (adult) (pediatric): Secondary | ICD-10-CM | POA: Diagnosis not present

## 2016-06-18 NOTE — Progress Notes (Signed)
Past Surgical History She  has a past surgical history that includes Cesarean section; Tonsillectomy; Gastric restriction surgery; Tubal ligation; Cholecystectomy; and laparoscopic appendectomy (N/A, 10/25/2013).  No Known Allergies  Family History Her family history includes Alzheimer's disease in her maternal grandfather; Arthritis in her maternal grandmother and mother; Fibromyalgia in her mother; Gout in her daughter and son; Heart disease in her maternal grandmother; Hyperlipidemia in her mother.  Social History She  reports that she has never smoked. She has never used smokeless tobacco. She reports that she drinks alcohol. She reports that she does not use drugs.  Review of systems Constitutional: Negative for fever and unexpected weight change.  HENT: Negative for congestion, dental problem, ear pain, nosebleeds, postnasal drip, rhinorrhea, sinus pressure, sneezing, sore throat and trouble swallowing.   Eyes: Negative for redness and itching.  Respiratory: Positive for cough. Negative for chest tightness, shortness of breath and wheezing.   Cardiovascular: Negative for palpitations and leg swelling.  Gastrointestinal: Negative for nausea and vomiting.  Genitourinary: Negative for dysuria.  Musculoskeletal: Negative for joint swelling.  Skin: Negative for rash.  Neurological: Negative for headaches.  Hematological: Does not bruise/bleed easily.  Psychiatric/Behavioral: Negative for dysphoric mood. The patient is not nervous/anxious.     Current Outpatient Prescriptions on File Prior to Visit  Medication Sig  . albuterol (PROVENTIL HFA;VENTOLIN HFA) 108 (90 Base) MCG/ACT inhaler Inhale 2 puffs into the lungs every 6 (six) hours as needed for wheezing or shortness of breath.  . allopurinol (ZYLOPRIM) 300 MG tablet Take 1 tablet (300 mg total) by mouth daily. For gout prevention  . ALPRAZolam (XANAX) 0.5 MG tablet Take 2 tablets (1 mg total) by mouth at bedtime as needed for anxiety.   . colchicine 0.6 MG tablet Take 1 tablet (0.6 mg total) by mouth 2 (two) times daily. Until symptoms clear.  . empagliflozin (JARDIANCE) 25 MG TABS tablet Take 25 mg by mouth daily.  Marland Kitchen glucose blood test strip Reli-on glucometer.  Test Blood sugar qid Dx. 250.01  . hyoscyamine (LEVBID) 0.375 MG 12 hr tablet Take 1 tablet (0.375 mg total) by mouth 2 (two) times daily.  . Lancets (ONETOUCH ULTRASOFT) lancets Test 1X per day and prn   Dx 250.01  . levothyroxine (SYNTHROID) 75 MCG tablet Take 1 tablet (75 mcg total) by mouth daily before breakfast.  . lisinopril (PRINIVIL,ZESTRIL) 20 MG tablet Take 1 tablet (20 mg total) by mouth daily.  . metFORMIN (GLUCOPHAGE) 500 MG tablet Take 0.5-1 tablets (250-500 mg total) by mouth daily.  . Multiple Vitamins-Minerals (ALIVE WOMENS ENERGY PO) Take 1 tablet by mouth daily. Reported on 03/22/2016  . Omega-3 Fatty Acids (OMEGA 3 PO) Take 4 capsules by mouth daily.  Marland Kitchen omeprazole (PRILOSEC) 40 MG capsule Take 1 capsule (40 mg total) by mouth daily.  . potassium chloride (KLOR-CON 10) 10 MEQ tablet Take 1 tablet (10 mEq total) by mouth daily.  . Vitamin D, Ergocalciferol, (DRISDOL) 50000 units CAPS capsule TAKE 1 CAPSULE EVERY 7 DAYS   No current facility-administered medications on file prior to visit.     Chief Complaint  Patient presents with  . Sleep Consult    referred by Dr. Laurance Flatten for OSA. Epworth: 7.    Sleep tests: PFG 01/04/09 >> AHI 16.6, SpO2 low 88%  Past medical history She  has a past medical history of Anxiety; Diabetes mellitus without complication ([place]); GERD (gastroesophageal reflux disease); Gout; IBS (irritable bowel syndrome); Sleep apnea; and Thyroid disease.  Vital signs BP 118/86 (BP  Location: Left Arm, Cuff Size: Normal)   Pulse 76   Ht 5\' 3"  (1.6 m)   Wt 272 lb (123.4 kg)   SpO2 99%   BMI 48.18 kg/m   History of Present Illness Holly Rodriguez is a 50 y.o. female for evaluation of sleep problems.  She had a sleep study in  February 2010.  This showed moderate sleep apnea.  She was started on CPAP.  She got a new machine about 2 years ago.  She has full face mask >> she is a mouth breather.  She hasn't tried any other mask types.  She uses [employer] for supplies >> these are expensive, and she has to pay out of pocket due to high deductible.  As a result she often buys supplies on line.  She gets trouble with air leak due to mask seal wearing away.  She has noticed progressive trouble feeling sleepy during the day.    She goes to sleep at 9 pm.  She falls asleep 30 minutes.  She wakes up 2 times to use the bathroom.  She gets out of bed at 5 am.  She feels okay in the morning, but gets sleepy as the day goes on.  She denies morning headache.  She does not use anything to help her fall sleep or stay awake.  She doesn't typically take naps.  She occasionally gets leg cramps.  She has gained about 10 lbs since original sleep study.  She denies sleep walking, sleep talking, bruxism, or nightmares.  There is no history of restless legs.  She denies sleep hallucinations, sleep paralysis, or cataplexy.  The Epworth score is 7 out of 24.  CPAP 05/19/16 to 06/17/16 >> used on 30 of 30 nights with average 8 hrs 1 min with CPAP 12 cm H2O.  Physical Exam:  General - No distress ENT - No sinus tenderness, no oral exudate, no LAN, no thyromegaly, TM clear, pupils equal/reactive, MP 3, elongated uvula Cardiac - s1s2 regular, no murmur, pulses symmetric Chest - No wheeze/rales/dullness, good air entry, normal respiratory excursion Back - No focal tenderness Abd - Soft, non-tender, no organomegaly, + bowel sounds Ext - No edema Neuro - Normal strength, cranial nerves intact Skin - No rashes Psych - Normal mood, and behavior  Discussion: She has history of obstructive sleep apnea.  Her download shows good compliance with CPAP.  She has progressive daytime hypersomnolence.  She has gained weight since original sleep  study.  We discussed how sleep apnea can affect various health problems, including risks for hypertension, cardiovascular disease, and diabetes.  We also discussed how sleep disruption can increase risks for accidents, such as while driving.  Weight loss as a means of improving sleep apnea was also reviewed.  Additional treatment options discussed were CPAP therapy, oral appliance, and surgical intervention.  Assessment/plan:  Obstructive sleep apnea. - will arrange for CPAP titration study to determine optimal pressure setting - she can have mask fit assessed in sleep lab also - will arrange for new DME after reviewing her titration study  Obesity. - discussed importance of weight loss   Patient Instructions  Will arrange for CPAP titration study at sleep lab Will call to schedule follow up after sleep study reviewed    Chesley Mires, M.D. Pager [phone] 06/18/2016, 4:07 PM

## 2016-06-18 NOTE — Patient Instructions (Signed)
Will arrange for CPAP titration study at sleep lab Will call to schedule follow up after sleep study reviewed

## 2016-06-18 NOTE — Progress Notes (Signed)
   Subjective:    Patient ID: Holly Rodriguez, female    DOB: Mar 12, 1966, 50 y.o.   MRN: [id number]  HPI    Review of Systems  Constitutional: Negative for fever and unexpected weight change.  HENT: Negative for congestion, dental problem, ear pain, nosebleeds, postnasal drip, rhinorrhea, sinus pressure, sneezing, sore throat and trouble swallowing.   Eyes: Negative for redness and itching.  Respiratory: Positive for cough. Negative for chest tightness, shortness of breath and wheezing.   Cardiovascular: Negative for palpitations and leg swelling.  Gastrointestinal: Negative for nausea and vomiting.  Genitourinary: Negative for dysuria.  Musculoskeletal: Negative for joint swelling.  Skin: Negative for rash.  Neurological: Negative for headaches.  Hematological: Does not bruise/bleed easily.  Psychiatric/Behavioral: Negative for dysphoric mood. The patient is not nervous/anxious.        Objective:   Physical Exam        Assessment & Plan:

## 2016-07-19 ENCOUNTER — Telehealth: Payer: Self-pay | Admitting: Family Medicine

## 2016-07-19 MED ORDER — HYOSCYAMINE SULFATE 0.125 MG PO TABS: 0.1250 mg | ORAL_TABLET | ORAL | 0 refills | Status: DC | PRN

## 2016-07-19 NOTE — Telephone Encounter (Signed)
The pt needs hyoscyamine without the extended release but there isn't a 0.375 dose. What dose and how often will she need for regular hyoscyamine?

## 2016-07-28 ENCOUNTER — Encounter (HOSPITAL_BASED_OUTPATIENT_CLINIC_OR_DEPARTMENT_OTHER): Payer: BLUE CROSS/BLUE SHIELD

## 2016-07-31 ENCOUNTER — Encounter: Payer: Self-pay | Admitting: Family Medicine

## 2016-07-31 ENCOUNTER — Ambulatory Visit (INDEPENDENT_AMBULATORY_CARE_PROVIDER_SITE_OTHER): Payer: BLUE CROSS/BLUE SHIELD | Admitting: Family Medicine

## 2016-07-31 ENCOUNTER — Ambulatory Visit (INDEPENDENT_AMBULATORY_CARE_PROVIDER_SITE_OTHER): Payer: BLUE CROSS/BLUE SHIELD

## 2016-07-31 VITALS — BP 121/79 | HR 84 | Temp 97.2°F | Ht 63.0 in | Wt 273.0 lb

## 2016-07-31 DIAGNOSIS — I152 Hypertension secondary to endocrine disorders: Secondary | ICD-10-CM

## 2016-07-31 DIAGNOSIS — E559 Vitamin D deficiency, unspecified: Secondary | ICD-10-CM

## 2016-07-31 DIAGNOSIS — E781 Pure hyperglyceridemia: Secondary | ICD-10-CM

## 2016-07-31 DIAGNOSIS — I10 Essential (primary) hypertension: Secondary | ICD-10-CM

## 2016-07-31 DIAGNOSIS — E039 Hypothyroidism, unspecified: Secondary | ICD-10-CM | POA: Diagnosis not present

## 2016-07-31 DIAGNOSIS — K219 Gastro-esophageal reflux disease without esophagitis: Secondary | ICD-10-CM

## 2016-07-31 DIAGNOSIS — E119 Type 2 diabetes mellitus without complications: Secondary | ICD-10-CM

## 2016-07-31 DIAGNOSIS — D509 Iron deficiency anemia, unspecified: Secondary | ICD-10-CM

## 2016-07-31 DIAGNOSIS — E1159 Type 2 diabetes mellitus with other circulatory complications: Secondary | ICD-10-CM | POA: Diagnosis not present

## 2016-07-31 DIAGNOSIS — J329 Chronic sinusitis, unspecified: Secondary | ICD-10-CM

## 2016-07-31 DIAGNOSIS — J310 Chronic rhinitis: Secondary | ICD-10-CM

## 2016-07-31 DIAGNOSIS — F411 Generalized anxiety disorder: Secondary | ICD-10-CM | POA: Diagnosis not present

## 2016-07-31 LAB — BAYER DCA HB A1C WAIVED: HB A1C (BAYER DCA - WAIVED): 7.1 % — ABNORMAL HIGH (ref <7.0)

## 2016-07-31 MED ORDER — AMOXICILLIN 500 MG PO CAPS: 500.0000 mg | ORAL_CAPSULE | Freq: Three times a day (TID) | ORAL | 0 refills | Status: DC

## 2016-07-31 MED ORDER — METFORMIN HCL 500 MG PO TABS: 500.0000 mg | ORAL_TABLET | Freq: Two times a day (BID) | ORAL | 3 refills | Status: DC

## 2016-07-31 MED ORDER — ALPRAZOLAM 0.5 MG PO TABS: 1.0000 mg | ORAL_TABLET | Freq: Every evening | ORAL | 1 refills | Status: DC | PRN

## 2016-07-31 MED ORDER — FLUTICASONE PROPIONATE 50 MCG/ACT NA SUSP: 2.0000 | Freq: Every day | NASAL | 6 refills | Status: DC

## 2016-07-31 NOTE — Patient Instructions (Addendum)
Continue current medications. Continue good therapeutic lifestyle changes which include good diet and exercise. Fall precautions discussed with patient. If an FOBT was given today- please return it to our front desk. If you are over 50 years old - you may need Prevnar 49 or the adult Pneumonia vaccine.  **Flu shots are available--- please call and schedule a FLU-CLINIC appointment**  After your visit with [place] today you will receive a survey in the mail or online from [employer] regarding your care with [place]. Please take a moment to fill this out. Your feedback is very important to [place] as you can help [place] better understand your patient needs as well as improve your experience and satisfaction. WE CARE ABOUT YOU!!!   Consider joining Weight Watchers program Continue with aggressive therapeutic lifestyle changes with walking and drinking more water and fluids and eating less carbs Take the antibiotic as directed, use nasal saline, use Flonase, and take one Claritin daily. Do not forget to get your flu shot during October

## 2016-07-31 NOTE — Progress Notes (Signed)
Subjective:    Patient ID: JOSEPH JOHNS, female    DOB: 06-09-1966, 50 y.o.   MRN: [id number]  HPI Pt here for follow up and management of chronic medical problems which includes diabetes, anemia, and hypertension. She is taking medications regularly.This patient returns to the office for regular visit today. She has a history of type 2 diabetes hypertension, morbid obesity and elevated cholesterol. She is complaining today of some headache and pain around the left eye. She also complains of some sinus issues. Her blood sugars been running up in the 175 to 2:30 range. She is requesting a refill on her Xanax. We'll get a chest x-ray today and traditional lab work today. The patient has had a history of a distant past of bypass surgery that had to be redone and she is put all that weight back on. She has a lot of abdominal discomfort with scar tissue in the abdomen. She denies any chest pain or shortness of breath. She does have occasional chest discomfort that lasts only a few seconds and seems to be relieved with belching. She does have occasional heartburn as currently taken and omeprazole 40 mg every morning. She denies any blood in the stool or black tarry bowel movements. She is passing her water without problems. She has a job where she sits down a lot and does not get up and move a lot. She also would like a referral to an ophthalmologist because she is having trouble with her eyes. Her next visit is due in December.     Patient Active Problem List   Diagnosis Date Noted  . Hypertriglyceridemia 03/22/2016  . Grief at loss of child 02/03/2016  . Morbid obesity ([place]) 11/15/2014  . Colon polyps 08/03/2014  . Hemorrhoids, external 08/03/2014  . Prolapsed internal hemorrhoids, grade 1 08/03/2014  . Iron deficiency 06/22/2014  . Type 2 diabetes mellitus not at goal [facility]) 12/06/2013  . Hypertension associated with diabetes ([place]) 12/06/2013  . Acute appendicitis 10/25/2013   Outpatient  Encounter Prescriptions as of 07/31/2016  Medication Sig  . allopurinol (ZYLOPRIM) 300 MG tablet Take 1 tablet (300 mg total) by mouth daily. For gout prevention  . ALPRAZolam (XANAX) 0.5 MG tablet Take 2 tablets (1 mg total) by mouth at bedtime as needed for anxiety.  . colchicine 0.6 MG tablet Take 1 tablet (0.6 mg total) by mouth 2 (two) times daily. Until symptoms clear.  . empagliflozin (JARDIANCE) 25 MG TABS tablet Take 25 mg by mouth daily.  Marland Kitchen glucose blood test strip Reli-on glucometer.  Test Blood sugar qid Dx. 250.01  . hyoscyamine (LEVBID) 0.375 MG 12 hr tablet Take 1 tablet (0.375 mg total) by mouth 2 (two) times daily.  . hyoscyamine (LEVSIN, ANASPAZ) 0.125 MG tablet Take 1 tablet (0.125 mg total) by mouth every 4 (four) hours as needed.  . Lancets (ONETOUCH ULTRASOFT) lancets Test 1X per day and prn   Dx 250.01  . levothyroxine (SYNTHROID) 75 MCG tablet Take 1 tablet (75 mcg total) by mouth daily before breakfast.  . lisinopril (PRINIVIL,ZESTRIL) 20 MG tablet Take 1 tablet (20 mg total) by mouth daily.  . metFORMIN (GLUCOPHAGE) 500 MG tablet Take 0.5-1 tablets (250-500 mg total) by mouth daily.  . Multiple Vitamins-Minerals (ALIVE WOMENS ENERGY PO) Take 1 tablet by mouth daily. Reported on 03/22/2016  . Omega-3 Fatty Acids (OMEGA 3 PO) Take 4 capsules by mouth daily.  Marland Kitchen omeprazole (PRILOSEC) 40 MG capsule Take 1 capsule (40 mg total) by mouth daily.  Marland Kitchen  potassium chloride (KLOR-CON 10) 10 MEQ tablet Take 1 tablet (10 mEq total) by mouth daily.  . Vitamin D, Ergocalciferol, (DRISDOL) 50000 units CAPS capsule TAKE 1 CAPSULE EVERY 7 DAYS  . albuterol (PROVENTIL HFA;VENTOLIN HFA) 108 (90 Base) MCG/ACT inhaler Inhale 2 puffs into the lungs every 6 (six) hours as needed for wheezing or shortness of breath. (Patient not taking: Reported on 07/31/2016)   No facility-administered encounter medications on file as of 07/31/2016.        Review of Systems  Constitutional: Negative.   HENT:  Positive for sinus pressure.   Eyes: Positive for pain (around left eye  - sharp pains).  Respiratory: Negative.   Cardiovascular: Negative.   Gastrointestinal: Negative.   Endocrine: Negative.        Elevated blood sugar  Genitourinary: Negative.   Musculoskeletal: Negative.   Skin: Negative.   Allergic/Immunologic: Negative.   Neurological: Positive for headaches.  Hematological: Negative.   Psychiatric/Behavioral: Negative.        Objective:   Physical Exam  Constitutional: She is oriented to person, place, and time. She appears well-developed and well-nourished. No distress.  HENT:  Head: Normocephalic and atraumatic.  Right Ear: External ear normal.  Left Ear: External ear normal.  Mouth/Throat: Oropharynx is clear and moist. No oropharyngeal exudate.  Slight redness in the posterior throat with nasal congestion bilaterally. Slight tenderness in the periorbital area on the left with no obvious swelling.  Eyes: Conjunctivae and EOM are normal. Pupils are equal, round, and reactive to light. Right eye exhibits no discharge. Left eye exhibits no discharge. No scleral icterus.  Neck: Normal range of motion. Neck supple. No thyromegaly present.  Cardiovascular: Normal rate, regular rhythm, normal heart sounds and intact distal pulses.   No murmur heard. The heart is regular at 84/m  Pulmonary/Chest: Effort normal and breath sounds normal. No respiratory distress. She has no wheezes. She has no rales.  Clear anteriorly and posteriorly  Abdominal: Soft. Bowel sounds are normal. She exhibits no mass. There is no tenderness. There is no rebound and no guarding.  Morbid obesity with slight leak increased tenderness in the epigastric area in the scar area.  Musculoskeletal: Normal range of motion. She exhibits no edema.  Lymphadenopathy:    She has no cervical adenopathy.  Neurological: She is alert and oriented to person, place, and time. She has normal reflexes. No cranial nerve  deficit.  Skin: Skin is warm and dry. No rash noted.  Psychiatric: She has a normal mood and affect. Her behavior is normal. Judgment and thought content normal.  Nursing note and vitals reviewed.  BP 121/79 (BP Location: Left Arm)   Pulse 84   Temp 97.2 F (36.2 C) (Oral)   Ht 5' 3"  (1.6 m)   Wt 273 lb (123.8 kg)   LMP 06/16/2016 (Approximate)   BMI 48.36 kg/m    [facility] reading (PRIMARY) by  Dr. Brunilda Payor x-ray with results pending                                       Assessment & Plan:  1. Type 2 diabetes mellitus without complication, without long-term current use of insulin (HCC) -Increase metformin 500 mg to 1 twice daily - CBC with Differential/Platelet - Bayer DCA Hb A1c Waived  2. Hypertriglyceridemia -Continue with aggressive therapeutic lifestyle changes  - DG Chest 2 View; Future - Lipid panel - CBC with  Differential/Platelet  3. Hypothyroidism, unspecified hypothyroidism type -Continue with current treatment pending results of lab work - CBC with Differential/Platelet - Thyroid Panel With TSH  4. Hypertension associated with diabetes ([place]) -The blood pressure is good today and she will continue with current treatment - DG Chest 2 View; Future - BMP8+EGFR - CBC with Differential/Platelet - Hepatic function panel  5. Vitamin D deficiency -Continue with current treatment pending results of lab work - CBC with Differential/Platelet - VITAMIN D 25 Hydroxy (Vit-D Deficiency, Fractures)  6. Gastroesophageal reflux disease, esophagitis presence not specified -Because of some increasing indigestion the patient will add a ranitidine 150 mg to her omeprazole treatment and take the ranitidine before dinner every evening. - CBC with Differential/Platelet  7. Anemia, iron deficiency - CBC with Differential/Platelet  8. Anxiety state - ALPRAZolam (XANAX) 0.5 MG tablet; Take 2 tablets (1 mg total) by mouth at bedtime as needed for anxiety.  Dispense: 90 tablet;  Refill: 1  9. Morbid obesity, unspecified obesity type ([place]) -Consider getting involved in Weight Watchers or a support group like it.  10. Rhinosinusitis -Take amoxicillin 500 3 times a day, use Flonase, use nasal saline, and drink plenty of fluids.  Meds ordered this encounter  Medications  . ALPRAZolam (XANAX) 0.5 MG tablet    Sig: Take 2 tablets (1 mg total) by mouth at bedtime as needed for anxiety.    Dispense:  90 tablet    Refill:  1  . amoxicillin (AMOXIL) 500 MG capsule    Sig: Take 1 capsule (500 mg total) by mouth 3 (three) times daily.    Dispense:  30 capsule    Refill:  0  . metFORMIN (GLUCOPHAGE) 500 MG tablet    Sig: Take 1 tablet (500 mg total) by mouth 2 (two) times daily with a meal.    Dispense:  180 tablet    Refill:  3  . fluticasone (FLONASE) 50 MCG/ACT nasal spray    Sig: Place 2 sprays into both nostrils daily.    Dispense:  16 g    Refill:  6   Patient Instructions  Continue current medications. Continue good therapeutic lifestyle changes which include good diet and exercise. Fall precautions discussed with patient. If an FOBT was given today- please return it to our front desk. If you are over 33 years old - you may need Prevnar 30 or the adult Pneumonia vaccine.  **Flu shots are available--- please call and schedule a FLU-CLINIC appointment**  After your visit with [place] today you will receive a survey in the mail or online from [employer] regarding your care with [place]. Please take a moment to fill this out. Your feedback is very important to [place] as you can help [place] better understand your patient needs as well as improve your experience and satisfaction. WE CARE ABOUT YOU!!!   Consider joining Weight Watchers program Continue with aggressive therapeutic lifestyle changes with walking and drinking more water and fluids and eating less carbs Take the antibiotic as directed, use nasal saline, use Flonase, and take one Claritin daily. Do not forget to get  your flu shot during October     Arrie Senate MD

## 2016-08-01 ENCOUNTER — Telehealth: Payer: Self-pay | Admitting: Family Medicine

## 2016-08-01 LAB — HEPATIC FUNCTION PANEL
ALBUMIN: 4.4 g/dL (ref 3.5–5.5)
ALK PHOS: 94 IU/L (ref 39–117)
ALT: 26 IU/L (ref 0–32)
AST: 22 IU/L (ref 0–40)
BILIRUBIN TOTAL: 0.3 mg/dL (ref 0.0–1.2)
BILIRUBIN, DIRECT: 0.1 mg/dL (ref 0.00–0.40)
TOTAL PROTEIN: 7.1 g/dL (ref 6.0–8.5)

## 2016-08-01 LAB — CBC WITH DIFFERENTIAL/PLATELET
BASOS ABS: 0 10*3/uL (ref 0.0–0.2)
Basos: 0 %
EOS (ABSOLUTE): 0 10*3/uL (ref 0.0–0.4)
EOS: 0 %
HEMOGLOBIN: 12.2 g/dL (ref 11.1–15.9)
Hematocrit: 37.8 % (ref 34.0–46.6)
IMMATURE GRANS (ABS): 0 10*3/uL (ref 0.0–0.1)
IMMATURE GRANULOCYTES: 0 %
LYMPHS: 39 %
Lymphocytes Absolute: 3.8 10*3/uL — ABNORMAL HIGH (ref 0.7–3.1)
MCH: 27.4 pg (ref 26.6–33.0)
MCHC: 32.3 g/dL (ref 31.5–35.7)
MCV: 85 fL (ref 79–97)
MONOCYTES: 7 %
Monocytes Absolute: 0.6 10*3/uL (ref 0.1–0.9)
Neutrophils Absolute: 5.2 10*3/uL (ref 1.4–7.0)
Neutrophils: 54 %
PLATELETS: 284 10*3/uL (ref 150–379)
RBC: 4.46 x10E6/uL (ref 3.77–5.28)
RDW: 15.9 % — ABNORMAL HIGH (ref 12.3–15.4)
WBC: 9.7 10*3/uL (ref 3.4–10.8)

## 2016-08-01 LAB — BMP8+EGFR
BUN / CREAT RATIO: 19 (ref 9–23)
BUN: 16 mg/dL (ref 6–24)
CALCIUM: 8.9 mg/dL (ref 8.7–10.2)
CHLORIDE: 96 mmol/L (ref 96–106)
CO2: 22 mmol/L (ref 18–29)
CREATININE: 0.85 mg/dL (ref 0.57–1.00)
GFR calc Af Amer: 92 mL/min/{1.73_m2} (ref >59)
GFR calc non Af Amer: 80 mL/min/{1.73_m2} (ref >59)
GLUCOSE: 121 mg/dL — AB (ref 65–99)
Potassium: 4.2 mmol/L (ref 3.5–5.2)
Sodium: 137 mmol/L (ref 134–144)

## 2016-08-01 LAB — THYROID PANEL WITH TSH
Free Thyroxine Index: 2.3 (ref 1.2–4.9)
T3 UPTAKE RATIO: 27 % (ref 24–39)
T4 TOTAL: 8.4 ug/dL (ref 4.5–12.0)
TSH: 1.99 u[IU]/mL (ref 0.450–4.500)

## 2016-08-01 LAB — LIPID PANEL
CHOL/HDL RATIO: 5.3 ratio — AB (ref 0.0–4.4)
Cholesterol, Total: 206 mg/dL — ABNORMAL HIGH (ref 100–199)
HDL: 39 mg/dL — AB (ref >39)
LDL Calculated: 89 mg/dL (ref 0–99)
TRIGLYCERIDES: 391 mg/dL — AB (ref 0–149)
VLDL Cholesterol Cal: 78 mg/dL — ABNORMAL HIGH (ref 5–40)

## 2016-08-01 LAB — VITAMIN D 25 HYDROXY (VIT D DEFICIENCY, FRACTURES): Vit D, 25-Hydroxy: 26.1 ng/mL — ABNORMAL LOW (ref 30.0–100.0)

## 2016-08-01 MED ORDER — ATORVASTATIN CALCIUM 10 MG PO TABS: 10.0000 mg | ORAL_TABLET | Freq: Every day | ORAL | 0 refills | Status: DC

## 2016-08-01 NOTE — Telephone Encounter (Signed)
Reviewed labs with pt and rx for lipitor sent into the pharmacy.

## 2016-08-06 DIAGNOSIS — H5712 Ocular pain, left eye: Secondary | ICD-10-CM | POA: Diagnosis not present

## 2016-08-06 DIAGNOSIS — R51 Headache: Secondary | ICD-10-CM | POA: Diagnosis not present

## 2016-08-06 DIAGNOSIS — D3131 Benign neoplasm of right choroid: Secondary | ICD-10-CM | POA: Diagnosis not present

## 2016-08-06 DIAGNOSIS — H2513 Age-related nuclear cataract, bilateral: Secondary | ICD-10-CM | POA: Diagnosis not present

## 2016-08-08 IMAGING — CR DG HIP (WITH OR WITHOUT PELVIS) 2-3V*R*
2 series · 2 of 2 positions shown · non-contrast
Comparison: Bone scan 06/17/2014.

CLINICAL DATA: Right groin pain. Prior appendectomy. No known
injury.

EXAM:
DG HIP (WITH OR WITHOUT PELVIS) 2-3V RIGHT

[view not recorded (1 of 2)]
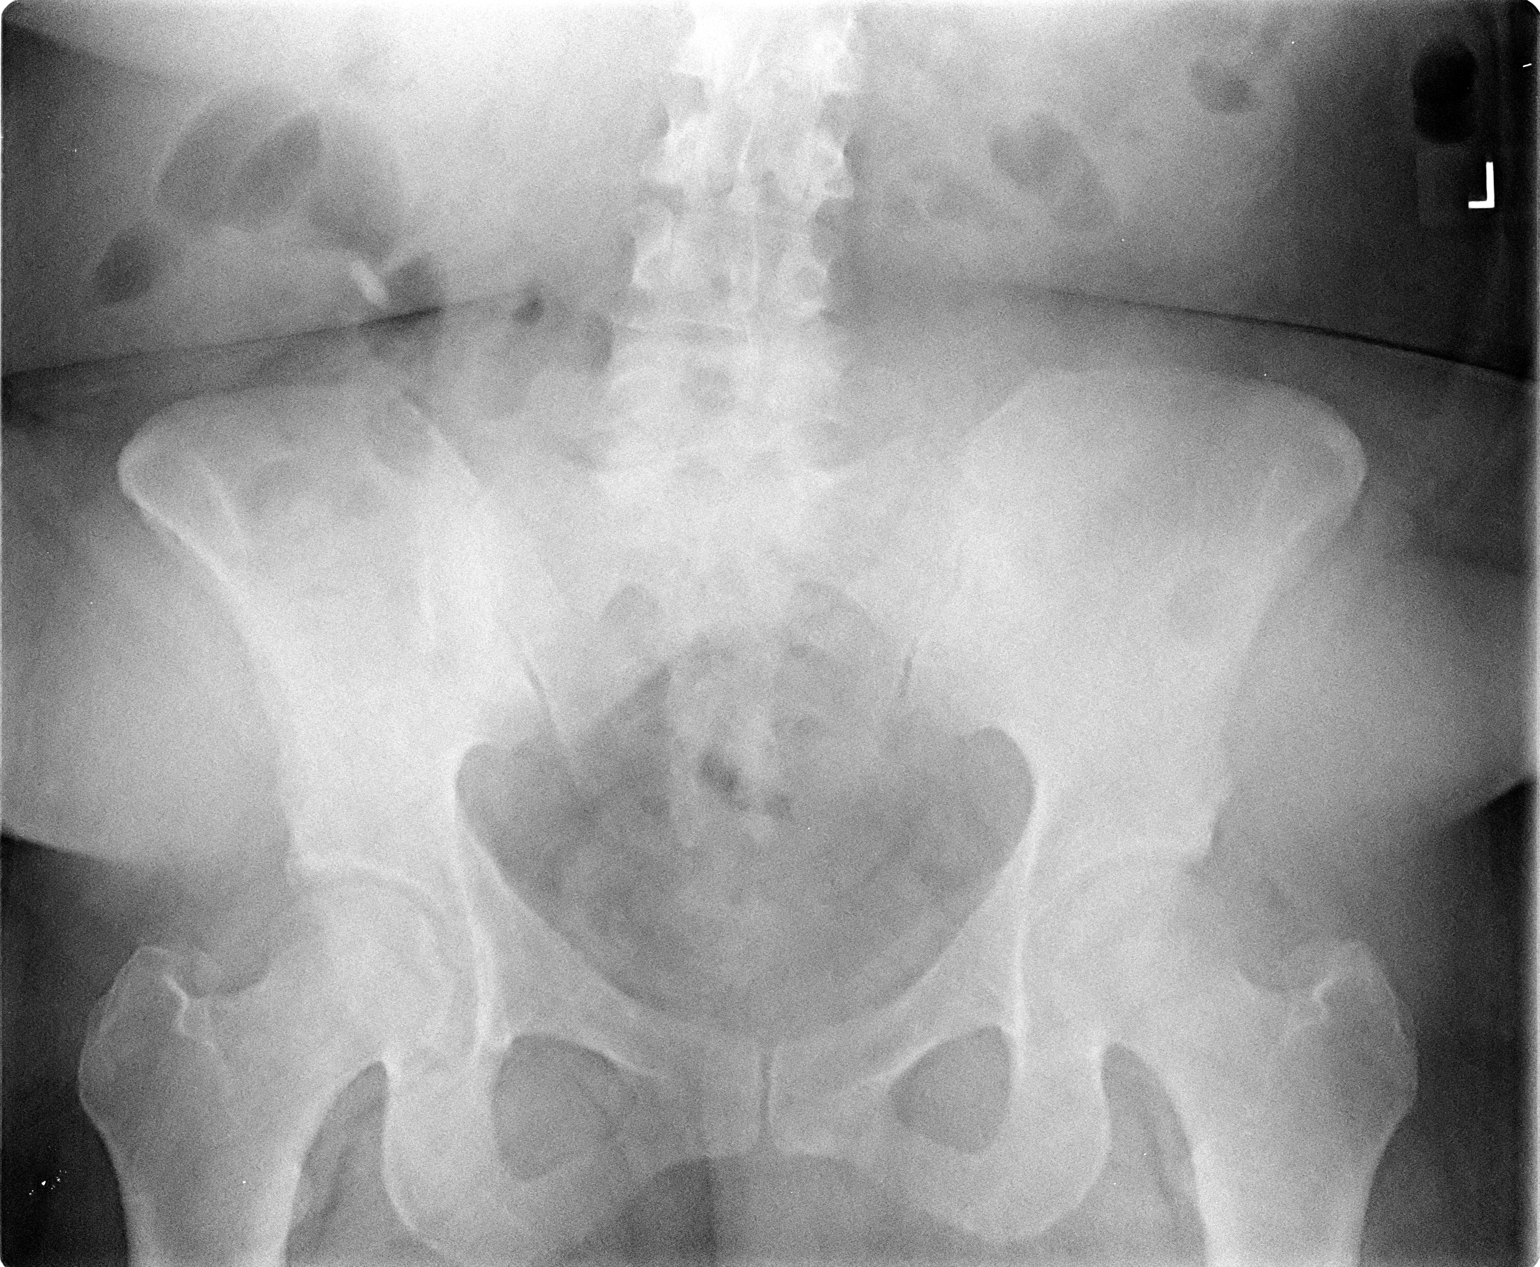

[view not recorded (2 of 2)]
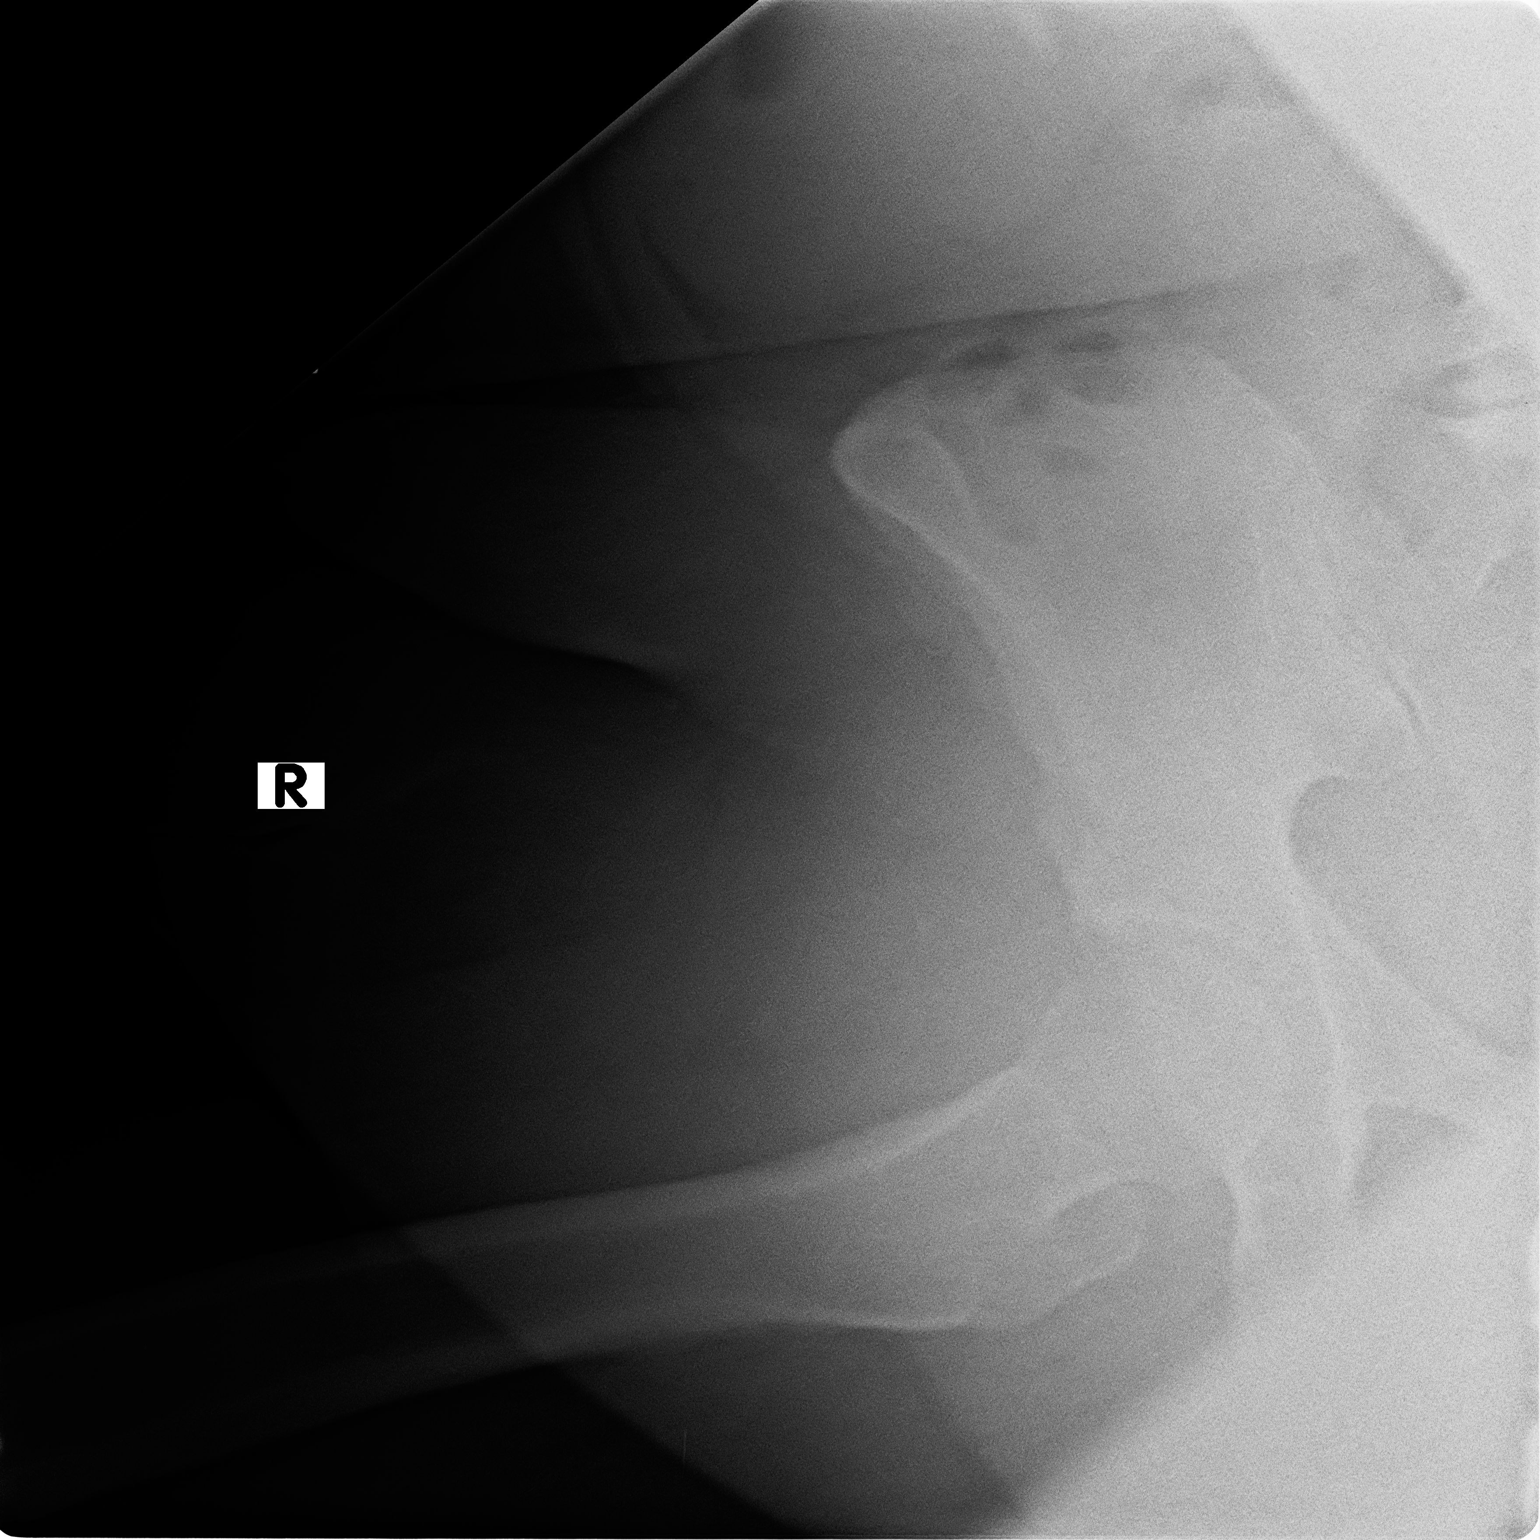

[2 of 2 positions shown; findings below may reference images not displayed]

FINDINGS: Degenerative changes lumbar spine and both hips. Rounded calcific
density of the right lower abdomen most likely pill fragment.
IMPRESSION: Degenerative changes lumbar spine and both hips.

## 2016-08-20 ENCOUNTER — Other Ambulatory Visit: Payer: Self-pay | Admitting: Family Medicine

## 2016-09-10 ENCOUNTER — Ambulatory Visit: Payer: BLUE CROSS/BLUE SHIELD | Admitting: Pharmacist

## 2016-10-15 DIAGNOSIS — M25551 Pain in right hip: Secondary | ICD-10-CM | POA: Diagnosis not present

## 2016-10-15 DIAGNOSIS — M7061 Trochanteric bursitis, right hip: Secondary | ICD-10-CM | POA: Diagnosis not present

## 2016-11-13 ENCOUNTER — Encounter: Payer: Self-pay | Admitting: Family Medicine

## 2016-11-13 ENCOUNTER — Ambulatory Visit (INDEPENDENT_AMBULATORY_CARE_PROVIDER_SITE_OTHER): Payer: BLUE CROSS/BLUE SHIELD | Admitting: Family Medicine

## 2016-11-13 VITALS — BP 128/81 | HR 101 | Temp 97.2°F | Ht 63.0 in | Wt 274.0 lb

## 2016-11-13 DIAGNOSIS — S161XXA Strain of muscle, fascia and tendon at neck level, initial encounter: Secondary | ICD-10-CM | POA: Diagnosis not present

## 2016-11-13 MED ORDER — CYCLOBENZAPRINE HCL 10 MG PO TABS: 10.0000 mg | ORAL_TABLET | Freq: Three times a day (TID) | ORAL | 0 refills | Status: DC | PRN

## 2016-11-13 NOTE — Progress Notes (Signed)
BP 128/81   Pulse (!) 101   Temp 97.2 F (36.2 C) (Oral)   Ht 5\' 3"  (1.6 m)   Wt 274 lb (124.3 kg)   BMI 48.54 kg/m    Subjective:    Patient ID: Holly Rodriguez, female    DOB: 07-Apr-1966, 50 y.o.   MRN: [id number]  HPI: Holly Rodriguez is a 50 y.o. female presenting on 11/13/2016 for Neck Pain (pt here today c/o neck stiffness that keeps her from turning her head)   HPI Neck strain and tightness Patient has been having neck muscle pain and strain and tightness has been going on for the past week. She has been trying massage and ice and ibuprofen which do not seem to be improving it much. She feels like she cannot turn her head to the) much because of the pain in the right side of her neck. She also has stiffness.  Relevant past medical, surgical, family and social history reviewed and updated as indicated. Interim medical history since our last visit reviewed. Allergies and medications reviewed and updated.  Review of Systems  Constitutional: Negative for chills and fever.  HENT: Negative for congestion, ear discharge, ear pain, postnasal drip, rhinorrhea, sinus pressure, sneezing and sore throat.   Eyes: Negative for pain, redness and visual disturbance.  Respiratory: Negative for chest tightness and shortness of breath.   Cardiovascular: Negative for chest pain and leg swelling.  Genitourinary: Negative for difficulty urinating and dysuria.  Musculoskeletal: Positive for myalgias, neck pain and neck stiffness. Negative for back pain and gait problem.  Skin: Negative for rash.  Neurological: Negative for light-headedness and headaches.  Psychiatric/Behavioral: Negative for agitation and behavioral problems.  All other systems reviewed and are negative.   Per HPI unless specifically indicated above      Objective:    BP 128/81   Pulse (!) 101   Temp 97.2 F (36.2 C) (Oral)   Ht 5\' 3"  (1.6 m)   Wt 274 lb (124.3 kg)   BMI 48.54 kg/m   Wt Readings from Last  3 Encounters:  11/13/16 274 lb (124.3 kg)  07/31/16 273 lb (123.8 kg)  06/18/16 272 lb (123.4 kg)    Physical Exam  Constitutional: She is oriented to person, place, and time. She appears well-developed and well-nourished. No distress.  Eyes: Conjunctivae are normal.  Cardiovascular: Normal rate, regular rhythm, normal heart sounds and intact distal pulses.   No murmur heard. Pulmonary/Chest: Effort normal and breath sounds normal. No respiratory distress. She has no wheezes.  Musculoskeletal: Normal range of motion. She exhibits no edema.       Cervical back: She exhibits tenderness and spasm. She exhibits normal range of motion, no bony tenderness, no edema and normal pulse.       Back:  Neurological: She is alert and oriented to person, place, and time. Coordination normal.  Skin: Skin is warm and dry. No rash noted. She is not diaphoretic.  Psychiatric: She has a normal mood and affect. Her behavior is normal.  Nursing note and vitals reviewed.      Assessment & Plan:   Problem List Items Addressed This Visit    None    Visit Diagnoses    Neck muscle strain, initial encounter    -  Primary   Unknown injury, will do stretching and heat and tennis ball massage and Flexeril, if not improved may do physical therapy in future   Relevant Medications   cyclobenzaprine (FLEXERIL) 10 MG tablet  Follow up plan: Return if symptoms worsen or fail to improve.  Counseling provided for all of the vaccine components No orders of the defined types were placed in this encounter.   Caryl Pina, MD [facility] 11/13/2016, 6:50 PM

## 2016-11-20 DIAGNOSIS — Z6841 Body Mass Index (BMI) 40.0 and over, adult: Secondary | ICD-10-CM | POA: Diagnosis not present

## 2016-11-20 DIAGNOSIS — Z113 Encounter for screening for infections with a predominantly sexual mode of transmission: Secondary | ICD-10-CM | POA: Diagnosis not present

## 2016-11-20 DIAGNOSIS — Z1151 Encounter for screening for human papillomavirus (HPV): Secondary | ICD-10-CM | POA: Diagnosis not present

## 2016-11-20 DIAGNOSIS — R87610 Atypical squamous cells of undetermined significance on cytologic smear of cervix (ASC-US): Secondary | ICD-10-CM | POA: Diagnosis not present

## 2016-11-20 DIAGNOSIS — Z1231 Encounter for screening mammogram for malignant neoplasm of breast: Secondary | ICD-10-CM | POA: Diagnosis not present

## 2016-11-20 DIAGNOSIS — Z01419 Encounter for gynecological examination (general) (routine) without abnormal findings: Secondary | ICD-10-CM | POA: Diagnosis not present

## 2016-11-29 ENCOUNTER — Encounter: Payer: Self-pay | Admitting: Family Medicine

## 2016-11-29 ENCOUNTER — Ambulatory Visit (INDEPENDENT_AMBULATORY_CARE_PROVIDER_SITE_OTHER): Payer: BLUE CROSS/BLUE SHIELD | Admitting: Family Medicine

## 2016-11-29 VITALS — BP 115/82 | HR 95 | Temp 98.0°F | Ht 63.0 in | Wt 276.0 lb

## 2016-11-29 DIAGNOSIS — E1159 Type 2 diabetes mellitus with other circulatory complications: Secondary | ICD-10-CM | POA: Diagnosis not present

## 2016-11-29 DIAGNOSIS — E559 Vitamin D deficiency, unspecified: Secondary | ICD-10-CM | POA: Diagnosis not present

## 2016-11-29 DIAGNOSIS — S161XXA Strain of muscle, fascia and tendon at neck level, initial encounter: Secondary | ICD-10-CM

## 2016-11-29 DIAGNOSIS — D509 Iron deficiency anemia, unspecified: Secondary | ICD-10-CM | POA: Diagnosis not present

## 2016-11-29 DIAGNOSIS — F411 Generalized anxiety disorder: Secondary | ICD-10-CM | POA: Diagnosis not present

## 2016-11-29 DIAGNOSIS — I152 Hypertension secondary to endocrine disorders: Secondary | ICD-10-CM

## 2016-11-29 DIAGNOSIS — I10 Essential (primary) hypertension: Secondary | ICD-10-CM | POA: Diagnosis not present

## 2016-11-29 DIAGNOSIS — E790 Hyperuricemia without signs of inflammatory arthritis and tophaceous disease: Secondary | ICD-10-CM | POA: Diagnosis not present

## 2016-11-29 DIAGNOSIS — K219 Gastro-esophageal reflux disease without esophagitis: Secondary | ICD-10-CM | POA: Diagnosis not present

## 2016-11-29 DIAGNOSIS — E119 Type 2 diabetes mellitus without complications: Secondary | ICD-10-CM

## 2016-11-29 DIAGNOSIS — E781 Pure hyperglyceridemia: Secondary | ICD-10-CM

## 2016-11-29 MED ORDER — METFORMIN HCL ER (OSM) 1000 MG PO TB24: 1000.0000 mg | ORAL_TABLET | Freq: Every day | ORAL | 3 refills | Status: DC

## 2016-11-29 MED ORDER — LISINOPRIL 20 MG PO TABS: 20.0000 mg | ORAL_TABLET | Freq: Every day | ORAL | 3 refills | Status: DC

## 2016-11-29 MED ORDER — ALLOPURINOL 300 MG PO TABS: 300.0000 mg | ORAL_TABLET | Freq: Every day | ORAL | 3 refills | Status: DC

## 2016-11-29 MED ORDER — VITAMIN D (ERGOCALCIFEROL) 1.25 MG (50000 UNIT) PO CAPS: ORAL_CAPSULE | ORAL | 3 refills | Status: DC

## 2016-11-29 MED ORDER — POTASSIUM CHLORIDE ER 10 MEQ PO TBCR: 10.0000 meq | EXTENDED_RELEASE_TABLET | Freq: Every day | ORAL | 3 refills | Status: DC

## 2016-11-29 MED ORDER — ALPRAZOLAM 0.5 MG PO TABS: 1.0000 mg | ORAL_TABLET | Freq: Every evening | ORAL | 1 refills | Status: DC | PRN

## 2016-11-29 MED ORDER — CYCLOBENZAPRINE HCL 10 MG PO TABS: 10.0000 mg | ORAL_TABLET | Freq: Three times a day (TID) | ORAL | 0 refills | Status: DC | PRN

## 2016-11-29 MED ORDER — OMEPRAZOLE 40 MG PO CPDR: 40.0000 mg | DELAYED_RELEASE_CAPSULE | Freq: Every day | ORAL | 3 refills | Status: DC

## 2016-11-29 MED ORDER — LEVOTHYROXINE SODIUM 75 MCG PO TABS: 75.0000 ug | ORAL_TABLET | Freq: Every day | ORAL | 3 refills | Status: DC

## 2016-11-29 MED ORDER — COLCHICINE 0.6 MG PO TABS: 0.6000 mg | ORAL_TABLET | Freq: Two times a day (BID) | ORAL | 0 refills | Status: DC

## 2016-11-29 MED ORDER — EMPAGLIFLOZIN 25 MG PO TABS: ORAL_TABLET | ORAL | 3 refills | Status: DC

## 2016-11-29 MED ORDER — ATORVASTATIN CALCIUM 10 MG PO TABS: 10.0000 mg | ORAL_TABLET | Freq: Every day | ORAL | 3 refills | Status: DC

## 2016-11-29 MED ORDER — HYOSCYAMINE SULFATE ER 0.375 MG PO TB12: 0.3750 mg | ORAL_TABLET | Freq: Two times a day (BID) | ORAL | 3 refills | Status: DC
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
## 2016-11-29 NOTE — Patient Instructions (Addendum)
Continue current medications. Continue good therapeutic lifestyle changes which include good diet and exercise. Fall precautions discussed with patient. If an FOBT was given today- please return it to our front desk. If you are over 51 years old - you may need Prevnar 59 or the adult Pneumonia vaccine.  **Flu shots are available--- please call and schedule a FLU-CLINIC appointment**  After your visit with [place] today you will receive a survey in the mail or online from [employer] regarding your care with [place]. Please take a moment to fill this out. Your feedback is very important to [place] as you can help [place] better understand your patient needs as well as improve your experience and satisfaction. WE CARE ABOUT YOU!!!   Continue to make every effort possible to get more exercise with walking. Continue to follow diet as closely as possible by watching carbs in the diet and drinking more water and fluids Develop regular exercise habits Avoid fast foods Avoid breads and potatoes

## 2016-11-30 ENCOUNTER — Other Ambulatory Visit: Payer: BLUE CROSS/BLUE SHIELD

## 2016-11-30 DIAGNOSIS — E559 Vitamin D deficiency, unspecified: Secondary | ICD-10-CM

## 2016-11-30 DIAGNOSIS — E781 Pure hyperglyceridemia: Secondary | ICD-10-CM

## 2016-11-30 DIAGNOSIS — E119 Type 2 diabetes mellitus without complications: Secondary | ICD-10-CM

## 2016-11-30 DIAGNOSIS — D509 Iron deficiency anemia, unspecified: Secondary | ICD-10-CM

## 2016-11-30 DIAGNOSIS — I152 Hypertension secondary to endocrine disorders: Secondary | ICD-10-CM

## 2016-11-30 DIAGNOSIS — K219 Gastro-esophageal reflux disease without esophagitis: Secondary | ICD-10-CM

## 2016-11-30 DIAGNOSIS — I10 Essential (primary) hypertension: Principal | ICD-10-CM

## 2016-11-30 DIAGNOSIS — E1159 Type 2 diabetes mellitus with other circulatory complications: Secondary | ICD-10-CM

## 2016-12-02 LAB — BMP8+EGFR
BUN / CREAT RATIO: 21 (ref 9–23)
BUN: 15 mg/dL (ref 6–24)
CHLORIDE: 99 mmol/L (ref 96–106)
CO2: 22 mmol/L (ref 18–29)
Calcium: 9.4 mg/dL (ref 8.7–10.2)
Creatinine, Ser: 0.7 mg/dL (ref 0.57–1.00)
GFR calc non Af Amer: 101 mL/min/{1.73_m2} (ref >59)
GFR, EST AFRICAN AMERICAN: 117 mL/min/{1.73_m2} (ref >59)
GLUCOSE: 127 mg/dL — AB (ref 65–99)
Potassium: 4.2 mmol/L (ref 3.5–5.2)
SODIUM: 138 mmol/L (ref 134–144)

## 2016-12-02 LAB — CBC WITH DIFFERENTIAL/PLATELET
BASOS ABS: 0.1 10*3/uL (ref 0.0–0.2)
Basos: 1 %
EOS (ABSOLUTE): 0 10*3/uL (ref 0.0–0.4)
Eos: 0 %
HEMOGLOBIN: 12.2 g/dL (ref 11.1–15.9)
Hematocrit: 36.7 % (ref 34.0–46.6)
Immature Grans (Abs): 0.1 10*3/uL (ref 0.0–0.1)
Immature Granulocytes: 1 %
LYMPHS ABS: 3.4 10*3/uL — AB (ref 0.7–3.1)
Lymphs: 33 %
MCH: 27.8 pg (ref 26.6–33.0)
MCHC: 33.2 g/dL (ref 31.5–35.7)
MCV: 84 fL (ref 79–97)
MONOS ABS: 0.6 10*3/uL (ref 0.1–0.9)
Monocytes: 6 %
NEUTROS ABS: 6.2 10*3/uL (ref 1.4–7.0)
Neutrophils: 59 %
Platelets: 260 10*3/uL (ref 150–379)
RBC: 4.39 x10E6/uL (ref 3.77–5.28)
RDW: 16.8 % — AB (ref 12.3–15.4)
WBC: 10.4 10*3/uL (ref 3.4–10.8)

## 2016-12-02 LAB — HEPATIC FUNCTION PANEL
ALBUMIN: 4 g/dL (ref 3.5–5.5)
ALT: 28 IU/L (ref 0–32)
AST: 27 IU/L (ref 0–40)
Alkaline Phosphatase: 104 IU/L (ref 39–117)
Bilirubin Total: 0.3 mg/dL (ref 0.0–1.2)
Bilirubin, Direct: 0.11 mg/dL (ref 0.00–0.40)
Total Protein: 6.8 g/dL (ref 6.0–8.5)

## 2016-12-02 LAB — NMR, LIPOPROFILE
CHOLESTEROL: 163 mg/dL (ref 100–199)
HDL Cholesterol by NMR: 39 mg/dL — ABNORMAL LOW (ref >39)
HDL Particle Number: 28 umol/L — ABNORMAL LOW (ref >=30.5)
LDL PARTICLE NUMBER: 1019 nmol/L — AB (ref <1000)
LDL SIZE: 19.8 nm (ref >20.5)
LP-IR SCORE: 90 — AB (ref <=45)
Small LDL Particle Number: 788 nmol/L — ABNORMAL HIGH (ref <=527)
Triglycerides by NMR: 401 mg/dL — ABNORMAL HIGH (ref 0–149)

## 2016-12-02 LAB — VITAMIN D 25 HYDROXY (VIT D DEFICIENCY, FRACTURES): VIT D 25 HYDROXY: 27.5 ng/mL — AB (ref 30.0–100.0)

## 2016-12-02 LAB — BAYER DCA HB A1C WAIVED: HB A1C (BAYER DCA - WAIVED): 7.8 % — ABNORMAL HIGH (ref <7.0)

## 2016-12-03 ENCOUNTER — Other Ambulatory Visit: Payer: Self-pay | Admitting: *Deleted

## 2016-12-03 MED ORDER — VITAMIN D (ERGOCALCIFEROL) 1.25 MG (50000 UNIT) PO CAPS: ORAL_CAPSULE | ORAL | 3 refills | Status: DC

## 2016-12-09 ENCOUNTER — Telehealth: Payer: Self-pay | Admitting: Family Medicine

## 2016-12-09 NOTE — Telephone Encounter (Signed)
Pt aware - means extended release

## 2016-12-31 ENCOUNTER — Other Ambulatory Visit: Payer: Self-pay | Admitting: Family Medicine

## 2017-01-10 ENCOUNTER — Telehealth: Payer: Self-pay | Admitting: Family Medicine

## 2017-01-31 ENCOUNTER — Other Ambulatory Visit: Payer: Self-pay

## 2017-01-31 MED ORDER — ATORVASTATIN CALCIUM 10 MG PO TABS: 10.0000 mg | ORAL_TABLET | Freq: Every day | ORAL | 0 refills | Status: DC

## 2017-04-04 ENCOUNTER — Ambulatory Visit (INDEPENDENT_AMBULATORY_CARE_PROVIDER_SITE_OTHER): Payer: BLUE CROSS/BLUE SHIELD | Admitting: Family Medicine

## 2017-04-04 ENCOUNTER — Encounter: Payer: Self-pay | Admitting: Family Medicine

## 2017-04-04 VITALS — BP 108/70 | HR 93 | Temp 97.2°F | Ht 63.0 in | Wt 266.0 lb

## 2017-04-04 DIAGNOSIS — K219 Gastro-esophageal reflux disease without esophagitis: Secondary | ICD-10-CM | POA: Diagnosis not present

## 2017-04-04 DIAGNOSIS — I10 Essential (primary) hypertension: Secondary | ICD-10-CM | POA: Diagnosis not present

## 2017-04-04 DIAGNOSIS — E781 Pure hyperglyceridemia: Secondary | ICD-10-CM | POA: Diagnosis not present

## 2017-04-04 DIAGNOSIS — E559 Vitamin D deficiency, unspecified: Secondary | ICD-10-CM | POA: Diagnosis not present

## 2017-04-04 DIAGNOSIS — E1159 Type 2 diabetes mellitus with other circulatory complications: Secondary | ICD-10-CM | POA: Diagnosis not present

## 2017-04-04 DIAGNOSIS — I152 Hypertension secondary to endocrine disorders: Secondary | ICD-10-CM

## 2017-04-04 DIAGNOSIS — D509 Iron deficiency anemia, unspecified: Secondary | ICD-10-CM | POA: Diagnosis not present

## 2017-04-04 DIAGNOSIS — S161XXA Strain of muscle, fascia and tendon at neck level, initial encounter: Secondary | ICD-10-CM | POA: Diagnosis not present

## 2017-04-04 DIAGNOSIS — E119 Type 2 diabetes mellitus without complications: Secondary | ICD-10-CM

## 2017-04-04 NOTE — Progress Notes (Signed)
Subjective:    Patient ID: Holly Rodriguez, female    DOB: 1966/02/12, 51 y.o.   MRN: [id number]  HPI Pt here for follow up and management of chronic medical problems which includes hyperlipidemia, diabetes and hypertension.  She is taking medication regularly.The patient is been complaining of neck pain for 3 weeks. She has been moving. She also complains of numbness in both hands. The patient's last creatinine was 0.70. The patient denies any chest pain or shortness of breath. She has had a slight cough which she thinks is due to her allergies. She will start some Mucinex. She denies any fever with a cough. She is passing her water without problems. She denies any trouble with her stomach including nausea vomiting diarrhea or blood in the stool. The neck pain which is been worse for 3 weeks did happen while she was moving and this seems to be getting a little bit better but she is also having some numbness in both hands as mentioned earlier. She plans to get her mammogram when she goes for her GYN visit and the eye exam has been done. She says that her home blood sugars have been most recently running about 169 fasting. While she was moving there were running higher and were between 203 100. She gets her colonoscopies done by Dr.Magod every 5 years and she says this would be coming up soon but she is not sure when the exact date is and this is because she has had some polyps.    Patient Active Problem List   Diagnosis Date Noted  . Hypertriglyceridemia 03/22/2016  . Grief at loss of child 02/03/2016  . Morbid obesity ([place]) 11/15/2014  . Colon polyps 08/03/2014  . Hemorrhoids, external 08/03/2014  . Prolapsed internal hemorrhoids, grade 1 08/03/2014  . Iron deficiency 06/22/2014  . Type 2 diabetes mellitus not at goal [facility]) 12/06/2013  . Hypertension associated with diabetes ([place]) 12/06/2013  . Acute appendicitis 10/25/2013   Outpatient Encounter Prescriptions as of 04/04/2017  Medication  Sig  . allopurinol (ZYLOPRIM) 300 MG tablet Take 1 tablet (300 mg total) by mouth daily. For gout prevention  . ALPRAZolam (XANAX) 0.5 MG tablet Take 2 tablets (1 mg total) by mouth at bedtime as needed for anxiety.  . colchicine 0.6 MG tablet Take 1 tablet (0.6 mg total) by mouth 2 (two) times daily. Until symptoms clear.  . cyclobenzaprine (FLEXERIL) 10 MG tablet Take 1 tablet (10 mg total) by mouth 3 (three) times daily as needed for muscle spasms.  . empagliflozin (JARDIANCE) 25 MG TABS tablet TAKE ONE (1) TABLET EACH DAY  . fluticasone (FLONASE) 50 MCG/ACT nasal spray Place 2 sprays into both nostrils daily.  Marland Kitchen glucose blood test strip Reli-on glucometer.  Test Blood sugar qid Dx. 250.01  . hyoscyamine (LEVBID) 0.375 MG 12 hr tablet Take 1 tablet (0.375 mg total) by mouth 2 (two) times daily.  . Lancets (ONETOUCH ULTRASOFT) lancets Test 1X per day and prn   Dx 250.01  . levothyroxine (SYNTHROID) 75 MCG tablet Take 1 tablet (75 mcg total) by mouth daily before breakfast.  . lisinopril (PRINIVIL,ZESTRIL) 20 MG tablet Take 1 tablet (20 mg total) by mouth daily.  . metFORMIN (GLUCOPHAGE) 500 MG tablet Take 500 mg by mouth 2 (two) times daily with a meal.  . Multiple Vitamins-Minerals (ALIVE WOMENS ENERGY PO) Take 1 tablet by mouth daily. Reported on 03/22/2016  . Omega-3 Fatty Acids (OMEGA 3 PO) Take 4 capsules by mouth daily.  Marland Kitchen  omeprazole (PRILOSEC) 40 MG capsule Take 1 capsule (40 mg total) by mouth daily.  . potassium chloride (K-DUR) 10 MEQ tablet TAKE 1 TABLET BY MOUTH  DAILY  . Vitamin D, Ergocalciferol, (DRISDOL) 50000 units CAPS capsule TAKE 1 CAPSULE EVERY 7 DAYS  . [DISCONTINUED] atorvastatin (LIPITOR) 10 MG tablet Take 1 tablet (10 mg total) by mouth daily.  . [DISCONTINUED] metformin (FORTAMET) 1000 MG (OSM) 24 hr tablet Take 1 tablet (1,000 mg total) by mouth daily with breakfast.   No facility-administered encounter medications on file as of 04/04/2017.       Review of Systems    Constitutional: Negative.   HENT: Negative.   Eyes: Negative.   Respiratory: Negative.   Cardiovascular: Negative.   Gastrointestinal: Negative.   Endocrine: Negative.   Genitourinary: Negative.   Musculoskeletal: Positive for neck pain and neck stiffness.  Skin: Negative.   Allergic/Immunologic: Negative.   Neurological: Positive for numbness (in bilateral hands).  Hematological: Negative.   Psychiatric/Behavioral: Negative.        Objective:   Physical Exam  Constitutional: She is oriented to person, place, and time. She appears well-developed and well-nourished. No distress.  The patient is pleasant and alert.  HENT:  Head: Normocephalic and atraumatic.  Right Ear: External ear normal.  Left Ear: External ear normal.  Nose: Nose normal.  Mouth/Throat: No oropharyngeal exudate.  Eyes: Conjunctivae and EOM are normal. Pupils are equal, round, and reactive to light. Right eye exhibits no discharge. Left eye exhibits no discharge. No scleral icterus.  Neck: Normal range of motion. Neck supple. No thyromegaly present.  No thyromegaly no bruits and no adenopathy Some limited range of motion with turning her head from left to right and some tenderness in the occipital scalp  Cardiovascular: Normal rate, regular rhythm, normal heart sounds and intact distal pulses.   No murmur heard. Pulmonary/Chest: Effort normal and breath sounds normal. No respiratory distress. She has no wheezes. She has no rales.  Clear anteriorly and posteriorly  Abdominal: Soft. Bowel sounds are normal. She exhibits no mass. There is no tenderness. There is no rebound and no guarding.  Abdomen morbidly obese without masses tenderness or organ enlargement or bruits  Musculoskeletal: Normal range of motion. She exhibits no edema.  Lymphadenopathy:    She has no cervical adenopathy.  Neurological: She is alert and oriented to person, place, and time. She has normal reflexes. No cranial nerve deficit.  Skin:  Skin is warm and dry. No rash noted.  Psychiatric: She has a normal mood and affect. Her behavior is normal. Judgment and thought content normal.  Nursing note and vitals reviewed.   BP 108/70 (BP Location: Left Arm)   Pulse 93   Temp 97.2 F (36.2 C) (Oral)   Ht 5' 3"  (1.6 m)   Wt 266 lb (120.7 kg)   LMP 03/28/2017   BMI 47.12 kg/m        Assessment & Plan:  1. Type 2 diabetes mellitus without complication, without long-term current use of insulin ([facility]) -Continue with aggressive therapeutic lifestyle changes with efforts to lose weight through diet and exercise -Monitor blood sugars closely - CBC with Differential/Platelet; Future - Bayer DCA Hb A1c Waived; Future  2. Hypertriglyceridemia -Continue with current treatment pending results of lab work - CBC with Differential/Platelet; Future - Lipid panel; Future  3. Hypertension associated with diabetes ([place]) -The blood pressure is good today and the patient will continue with current treatment and avoid sodium intake as much as possible -  BMP8+EGFR; Future - CBC with Differential/Platelet; Future - Hepatic function panel; Future  4. Vitamin D deficiency -Continue with vitamin D replacement pending results of lab work - CBC with Differential/Platelet; Future - VITAMIN D 25 Hydroxy (Vit-D Deficiency, Fractures); Future  5. Gastroesophageal reflux disease, esophagitis presence not specified -No complaints today with this. The patient will continue to watch her diet and continue with her omeprazole - CBC with Differential/Platelet; Future - Hepatic function panel; Future  6. Iron deficiency anemia, unspecified iron deficiency anemia type - CBC with Differential/Platelet; Future  7. Neck muscle strain, initial encounter -Use warm wet compresses 20 minutes 3 or 4 times daily. -Try taking Aleve for no more than 1 week twice daily after breakfast and supper and take Flexeril at nighttime and maybe a half a one during the  day over the weekend  8. Morbid obesity due to excess calories ([place]) -Continue to work at weight loss through diet and exercise as aggressively as possible  Patient Instructions                       Medicare Annual Wellness Visit  [facility] and the medical providers at [facility] strive to bring you the best medical care.  In doing so we not only want to address your current medical conditions and concerns but also to detect new conditions early and prevent illness, disease and health-related problems.    Medicare offers a yearly Wellness Visit which allows our clinical staff to assess your need for preventative services including immunizations, lifestyle education, counseling to decrease risk of preventable diseases and screening for fall risk and other medical concerns.    This visit is provided free of charge (no copay) for all Medicare recipients. The clinical pharmacists at [facility] have begun to conduct these Wellness Visits which will also include a thorough review of all your medications.    As you primary medical provider recommend that you make an appointment for your Annual Wellness Visit if you have not done so already this year.  You may set up this appointment before you leave today or you may call back ([phone]) and schedule an appointment.  Please make sure when you call that you mention that you are scheduling your Annual Wellness Visit with the clinical pharmacist so that the appointment may be made at the proper length of time.    Continue current medications. Continue good therapeutic lifestyle changes which include good diet and exercise. Fall precautions discussed with patient. If an FOBT was given today- please return it to our front desk. If you are over 4 years old - you may need Prevnar 54 or the adult Pneumonia vaccine.  **Flu shots are available--- please call and schedule a FLU-CLINIC appointment**  After your  visit with [place] today you will receive a survey in the mail or online from [employer] regarding your care with [place]. Please take a moment to fill this out. Your feedback is very important to [place] as you can help [place] better understand your patient needs as well as improve your experience and satisfaction. WE CARE ABOUT YOU!!!  Try some Aleve over-the-counter 1 twice daily after breakfast and supper for 7 days and no longer Take the Flexeril at nighttime for a few nights and maybe a half a one during the day over the weekend when you're not working Use warm wet compresses to the neck 20 minutes 3 or 4 times daily Continue to  work on weight loss through diet and exercise as much as possible Do not forget to get your mammogram and pelvic exam and eye exams done Return the FOBT Return to the office for fasting lab work If the neck pain and numbness in the hands continue get back in touch with [place] in the next couple weeks and we can arrange to get some x-rays at that time Check with Dr. Watt Climes regarding your next colonoscopy date Continue to monitor blood sugars regularly   Arrie Senate MD

## 2017-04-04 NOTE — Patient Instructions (Addendum)
Medicare Annual Wellness Visit  [facility] and the medical providers at [facility] strive to bring you the best medical care.  In doing so we not only want to address your current medical conditions and concerns but also to detect new conditions early and prevent illness, disease and health-related problems.    Medicare offers a yearly Wellness Visit which allows our clinical staff to assess your need for preventative services including immunizations, lifestyle education, counseling to decrease risk of preventable diseases and screening for fall risk and other medical concerns.    This visit is provided free of charge (no copay) for all Medicare recipients. The clinical pharmacists at [facility] have begun to conduct these Wellness Visits which will also include a thorough review of all your medications.    As you primary medical provider recommend that you make an appointment for your Annual Wellness Visit if you have not done so already this year.  You may set up this appointment before you leave today or you may call back [phone]) and schedule an appointment.  Please make sure when you call that you mention that you are scheduling your Annual Wellness Visit with the clinical pharmacist so that the appointment may be made at the proper length of time.    Continue current medications. Continue good therapeutic lifestyle changes which include good diet and exercise. Fall precautions discussed with patient. If an FOBT was given today- please return it to our front desk. If you are over 52 years old - you may need Prevnar 9 or the adult Pneumonia vaccine.  **Flu shots are available--- please call and schedule a FLU-CLINIC appointment**  After your visit with [place] today you will receive a survey in the mail or online from [employer] regarding your care with [place]. Please take a moment to fill this out. Your feedback is very important  to [place] as you can help [place] better understand your patient needs as well as improve your experience and satisfaction. WE CARE ABOUT YOU!!!  Try some Aleve over-the-counter 1 twice daily after breakfast and supper for 7 days and no longer Take the Flexeril at nighttime for a few nights and maybe a half a one during the day over the weekend when you're not working Use warm wet compresses to the neck 20 minutes 3 or 4 times daily Continue to work on weight loss through diet and exercise as much as possible Do not forget to get your mammogram and pelvic exam and eye exams done Return the FOBT Return to the office for fasting lab work If the neck pain and numbness in the hands continue get back in touch with [place] in the next couple weeks and we can arrange to get some x-rays at that time Check with Dr. Watt Climes regarding your next colonoscopy date Continue to monitor blood sugars regularly

## 2017-05-08 ENCOUNTER — Telehealth: Payer: Self-pay | Admitting: Family Medicine

## 2017-05-08 ENCOUNTER — Other Ambulatory Visit: Payer: Self-pay

## 2017-05-08 MED ORDER — LEVOTHYROXINE SODIUM 75 MCG PO TABS: 75.0000 ug | ORAL_TABLET | Freq: Every day | ORAL | 1 refills | Status: DC

## 2017-05-08 MED ORDER — HYOSCYAMINE SULFATE ER 0.375 MG PO TB12: 0.3750 mg | ORAL_TABLET | Freq: Two times a day (BID) | ORAL | 1 refills | Status: DC

## 2017-05-08 MED ORDER — OMEPRAZOLE 40 MG PO CPDR: 40.0000 mg | DELAYED_RELEASE_CAPSULE | Freq: Every day | ORAL | 1 refills | Status: DC

## 2017-05-08 MED ORDER — METFORMIN HCL 500 MG PO TABS: 500.0000 mg | ORAL_TABLET | Freq: Two times a day (BID) | ORAL | 1 refills | Status: DC

## 2017-05-08 MED ORDER — ALLOPURINOL 300 MG PO TABS: 300.0000 mg | ORAL_TABLET | Freq: Every day | ORAL | 1 refills | Status: DC

## 2017-05-08 MED ORDER — LISINOPRIL 20 MG PO TABS: 20.0000 mg | ORAL_TABLET | Freq: Every day | ORAL | 1 refills | Status: DC

## 2017-05-08 NOTE — Telephone Encounter (Signed)
What is the name of the medication? metFORMIN (GLUCOPHAGE) 500 MG tablet lisinopril (PRINIVIL,ZESTRIL) 20 MG tablet potassium chloride (K-DUR) 10 MEQ tablet hyoscyamine (LEVBID) 0.375 MG 12 hr tablet omeprazole (PRILOSEC) 40 MG capsule allopurinol (ZYLOPRIM) 300 MG tablet levothyroxine (SYNTHROID) 75 MCG tablet  Have you contacted your pharmacy to request a refill? NO Send to [facility] order  Which pharmacy would you like this sent to? Send to [facility] order   Patient notified that their request is being sent to the clinical staff for review and that they should receive a call once it is complete. If they do not receive a call within 24 hours they can check with their pharmacy or our office.

## 2017-05-08 NOTE — Telephone Encounter (Signed)
Requested meds sent to mail order per patients request

## 2017-05-16 ENCOUNTER — Telehealth: Payer: Self-pay | Admitting: Family Medicine

## 2017-05-16 NOTE — Telephone Encounter (Signed)
Lab orders are in Epic Please advise regarding changing Levbid to Bentyl

## 2017-05-18 NOTE — Telephone Encounter (Signed)
Start bentyi  20 mg 4 times daily as needed before meals and at bedtime. 1/2 times four daily before meals and at bedtime may work and the patient should start with this first before increasing to 20 mg.

## 2017-05-19 MED ORDER — DICYCLOMINE HCL 20 MG PO TABS: ORAL_TABLET | ORAL | 1 refills | Status: DC

## 2017-05-31 ENCOUNTER — Other Ambulatory Visit: Payer: Self-pay | Admitting: Family Medicine

## 2017-05-31 ENCOUNTER — Other Ambulatory Visit: Payer: Self-pay | Admitting: *Deleted

## 2017-05-31 ENCOUNTER — Other Ambulatory Visit: Payer: BLUE CROSS/BLUE SHIELD

## 2017-05-31 DIAGNOSIS — E781 Pure hyperglyceridemia: Secondary | ICD-10-CM

## 2017-05-31 DIAGNOSIS — E1159 Type 2 diabetes mellitus with other circulatory complications: Secondary | ICD-10-CM

## 2017-05-31 DIAGNOSIS — I10 Essential (primary) hypertension: Principal | ICD-10-CM

## 2017-05-31 DIAGNOSIS — I152 Hypertension secondary to endocrine disorders: Secondary | ICD-10-CM

## 2017-05-31 DIAGNOSIS — E559 Vitamin D deficiency, unspecified: Secondary | ICD-10-CM

## 2017-05-31 DIAGNOSIS — E119 Type 2 diabetes mellitus without complications: Secondary | ICD-10-CM

## 2017-05-31 DIAGNOSIS — G4733 Obstructive sleep apnea (adult) (pediatric): Secondary | ICD-10-CM

## 2017-06-02 DIAGNOSIS — E781 Pure hyperglyceridemia: Secondary | ICD-10-CM | POA: Diagnosis not present

## 2017-06-02 DIAGNOSIS — E559 Vitamin D deficiency, unspecified: Secondary | ICD-10-CM | POA: Diagnosis not present

## 2017-06-02 DIAGNOSIS — E1159 Type 2 diabetes mellitus with other circulatory complications: Secondary | ICD-10-CM | POA: Diagnosis not present

## 2017-06-02 DIAGNOSIS — I10 Essential (primary) hypertension: Secondary | ICD-10-CM | POA: Diagnosis not present

## 2017-06-02 DIAGNOSIS — E119 Type 2 diabetes mellitus without complications: Secondary | ICD-10-CM | POA: Diagnosis not present

## 2017-06-02 LAB — BAYER DCA HB A1C WAIVED: HB A1C: 8.5 % — AB (ref <7.0)

## 2017-06-02 NOTE — Addendum Note (Signed)
Addended by: Zannie Cove on: 06/02/2017 10:56 AM   Modules accepted: Orders

## 2017-06-02 NOTE — Addendum Note (Signed)
Addended by: Zannie Cove on: 06/02/2017 01:01 PM   Modules accepted: Orders

## 2017-06-04 ENCOUNTER — Telehealth: Payer: Self-pay | Admitting: *Deleted

## 2017-06-04 LAB — CBC WITH DIFFERENTIAL/PLATELET
BASOS ABS: 0 10*3/uL (ref 0.0–0.2)
Basos: 0 %
EOS (ABSOLUTE): 0.3 10*3/uL (ref 0.0–0.4)
Eos: 4 %
HEMATOCRIT: 39.4 % (ref 34.0–46.6)
HEMOGLOBIN: 13 g/dL (ref 11.1–15.9)
LYMPHS ABS: 5.7 10*3/uL — AB (ref 0.7–3.1)
LYMPHS: 66 %
MCH: 27.9 pg (ref 26.6–33.0)
MCHC: 33 g/dL (ref 31.5–35.7)
MCV: 85 fL (ref 79–97)
MONOCYTES: 11 %
Monocytes Absolute: 0.9 10*3/uL (ref 0.1–0.9)
NEUTROS ABS: 1.6 10*3/uL (ref 1.4–7.0)
Neutrophils: 19 %
Platelets: 231 10*3/uL (ref 150–379)
RBC: 4.66 x10E6/uL (ref 3.77–5.28)
RDW: 17.5 % — AB (ref 12.3–15.4)
WBC: 8.6 10*3/uL (ref 3.4–10.8)

## 2017-06-04 LAB — HEPATIC FUNCTION PANEL
ALBUMIN: 4.2 g/dL (ref 3.5–5.5)
ALK PHOS: 99 IU/L (ref 39–117)
ALT: 26 IU/L (ref 0–32)
AST: 28 IU/L (ref 0–40)
Bilirubin Total: 0.2 mg/dL (ref 0.0–1.2)
Bilirubin, Direct: 0.1 mg/dL (ref 0.00–0.40)
Total Protein: 7.1 g/dL (ref 6.0–8.5)

## 2017-06-04 LAB — BMP8+EGFR
BUN / CREAT RATIO: 22 (ref 9–23)
BUN: 21 mg/dL (ref 6–24)
CO2: 20 mmol/L (ref 20–29)
CREATININE: 0.94 mg/dL (ref 0.57–1.00)
Calcium: 9.2 mg/dL (ref 8.7–10.2)
Chloride: 101 mmol/L (ref 96–106)
GFR, EST AFRICAN AMERICAN: 81 mL/min/{1.73_m2} (ref >59)
GFR, EST NON AFRICAN AMERICAN: 70 mL/min/{1.73_m2} (ref >59)
Glucose: 152 mg/dL — ABNORMAL HIGH (ref 65–99)
POTASSIUM: 4.6 mmol/L (ref 3.5–5.2)
SODIUM: 139 mmol/L (ref 134–144)

## 2017-06-04 LAB — LIPID PANEL
CHOLESTEROL TOTAL: 231 mg/dL — AB (ref 100–199)
Chol/HDL Ratio: 6.4 ratio — ABNORMAL HIGH (ref 0.0–4.4)
HDL: 36 mg/dL — ABNORMAL LOW (ref >39)
TRIGLYCERIDES: 616 mg/dL — AB (ref 0–149)

## 2017-06-04 LAB — VITAMIN D 25 HYDROXY (VIT D DEFICIENCY, FRACTURES): VIT D 25 HYDROXY: 20.5 ng/mL — AB (ref 30.0–100.0)

## 2017-06-04 NOTE — Telephone Encounter (Signed)
Called patient with lab results.  Patient states that she does not have time to get in to see Tammy due to taking care of mother and grandmother.  But would like to talk over the phone if possible

## 2017-06-05 ENCOUNTER — Other Ambulatory Visit: Payer: Self-pay | Admitting: *Deleted

## 2017-06-05 MED ORDER — FENOFIBRATE 160 MG PO TABS: 160.0000 mg | ORAL_TABLET | Freq: Every day | ORAL | 0 refills | Status: DC

## 2017-06-20 ENCOUNTER — Telehealth: Payer: Self-pay | Admitting: Pharmacist

## 2017-06-20 MED ORDER — EMPAGLIFLOZIN-METFORMIN HCL ER 25-1000 MG PO TB24: 1.0000 | ORAL_TABLET | Freq: Every day | ORAL | 2 refills | Status: DC

## 2017-06-20 NOTE — Addendum Note (Signed)
Addended by: Cherre Robins B on: 06/20/2017 02:16 PM   Modules accepted: Orders

## 2017-06-20 NOTE — Telephone Encounter (Signed)
Patient has been off Jardiance for 2 weeks because coupon is not longer active.  Patient has not started Januvia yet - rx was not called in.   She states that she has not been taking metformin 500mg  bid because she forgets evening dose. SHe was taking XR metfromin but this was not covered by insurance.   Recommend she switch to SynJardy XR 25/1000mg  take 1 tablet each morning.  Patient was given discount card to assist with cost.  Also will hold off on Januvia for now but if BG is over 150 fasting or 200 post prandially then recommend come to office for appt to discuss med changes.

## 2017-06-27 NOTE — Telephone Encounter (Signed)
rx sent to pharmacy

## 2017-08-08 ENCOUNTER — Ambulatory Visit (INDEPENDENT_AMBULATORY_CARE_PROVIDER_SITE_OTHER): Payer: BLUE CROSS/BLUE SHIELD | Admitting: Family Medicine

## 2017-08-08 ENCOUNTER — Encounter: Payer: Self-pay | Admitting: Family Medicine

## 2017-08-08 VITALS — BP 103/70 | HR 83 | Temp 97.2°F | Ht 63.0 in | Wt 261.0 lb

## 2017-08-08 DIAGNOSIS — Z8261 Family history of arthritis: Secondary | ICD-10-CM

## 2017-08-08 DIAGNOSIS — R5383 Other fatigue: Secondary | ICD-10-CM | POA: Diagnosis not present

## 2017-08-08 DIAGNOSIS — E559 Vitamin D deficiency, unspecified: Secondary | ICD-10-CM

## 2017-08-08 DIAGNOSIS — D509 Iron deficiency anemia, unspecified: Secondary | ICD-10-CM | POA: Diagnosis not present

## 2017-08-08 DIAGNOSIS — I152 Hypertension secondary to endocrine disorders: Secondary | ICD-10-CM

## 2017-08-08 DIAGNOSIS — I10 Essential (primary) hypertension: Secondary | ICD-10-CM | POA: Diagnosis not present

## 2017-08-08 DIAGNOSIS — F419 Anxiety disorder, unspecified: Secondary | ICD-10-CM

## 2017-08-08 DIAGNOSIS — F329 Major depressive disorder, single episode, unspecified: Secondary | ICD-10-CM

## 2017-08-08 DIAGNOSIS — F32A Depression, unspecified: Secondary | ICD-10-CM

## 2017-08-08 DIAGNOSIS — E119 Type 2 diabetes mellitus without complications: Secondary | ICD-10-CM | POA: Diagnosis not present

## 2017-08-08 DIAGNOSIS — E1142 Type 2 diabetes mellitus with diabetic polyneuropathy: Secondary | ICD-10-CM

## 2017-08-08 DIAGNOSIS — Z23 Encounter for immunization: Secondary | ICD-10-CM | POA: Diagnosis not present

## 2017-08-08 DIAGNOSIS — K219 Gastro-esophageal reflux disease without esophagitis: Secondary | ICD-10-CM

## 2017-08-08 DIAGNOSIS — E1159 Type 2 diabetes mellitus with other circulatory complications: Secondary | ICD-10-CM | POA: Diagnosis not present

## 2017-08-08 DIAGNOSIS — E781 Pure hyperglyceridemia: Secondary | ICD-10-CM | POA: Diagnosis not present

## 2017-08-08 NOTE — Patient Instructions (Addendum)
Continue current medications. Continue good therapeutic lifestyle changes which include good diet and exercise. Fall precautions discussed with patient. If an FOBT was given today- please return it to our front desk. If you are over 51 years old - you may need Prevnar 14 or the adult Pneumonia vaccine.  **Flu shots are available--- please call and schedule a FLU-CLINIC appointment**  After your visit with [place] today you will receive a survey in the mail or online from [employer] regarding your care with [place]. Please take a moment to fill this out. Your feedback is very important to [place] as you can help [place] better understand your patient needs as well as improve your experience and satisfaction. WE CARE ABOUT YOU!!!   It is very important that you talk with your lawyer and try to get things worked out so your grandmother gets the best care and so that you can also take care of yourself in your own family. We are sorry about the loss of your mother. Do not forget to get your eye exam done Monitor your blood sugars closely and drink plenty of water and do all you can to lose weight through diet and exercise Talk with your minister talk with your uncle and talk with your lawyer and get things worked out to help your grandmother be in a better place so she will be better taken care of Use nasal saline in each nostril to help with nasal congestion Because of the strong family history of autoimmune disease we would recommend getting an appointment to be seen by a rheumatologist to further evaluate you for the possibility of this condition

## 2017-08-08 NOTE — Addendum Note (Signed)
Addended by: Zannie Cove on: 08/08/2017 04:30 PM   Modules accepted: Orders

## 2017-08-08 NOTE — Progress Notes (Signed)
Subjective:    Patient ID: Holly Rodriguez, female    DOB: 1966-04-27, 51 y.o.   MRN: [id number]  HPI Pt here for follow up and management of chronic medical problems which includes diabetes, hyperlipidemia and hypertension. She is taking medication regularly.The patient is complaining of fatigue today and she recently lost her mother. She has an FOBT at home which she has not return and she will bring this back. She is due to get her tetanus vaccine today. Future orders will be put in for lab work. The patient's mother had dermatomyositis and developed rheumatoid arthritis and pulmonary fibrosis and died in August 09, 2023 from the complications of the dermatomyositis. She was only 51 years old. Unfortunately the patient has been left with looking after her mother's mother who is 41 years old and also has rheumatoid arthritis. The patient spent a lot of time with her mother and has not being able to take good care of herself in the past few months. She is behind on her eye exam and several other things that need to be done. She is trying to sort out her life and how to best handle her job and looking after her grandmother now. She denies any chest pain or shortness of breath. She just feels tired all the time and I certainly understand why. She has irritable bowel syndrome and loose bowel movements but has not seen any blood in the stool or had any black tarry bowel movements. She is passing her water without problems. Her fasting blood sugars been running in the 160-170 range generally speaking. Her vital signs today are stable and her weight is down 5 pounds but she still has an elevated BMI.    Patient Active Problem List   Diagnosis Date Noted  . Hypertriglyceridemia 03/22/2016  . Grief at loss of child 02/03/2016  . Morbid obesity ([place]) 11/15/2014  . Colon polyps 08/03/2014  . Hemorrhoids, external 08/03/2014  . Prolapsed internal hemorrhoids, grade 1 08/03/2014  . Iron deficiency 06/22/2014  .  Type 2 diabetes mellitus not at goal [facility]) 12/06/2013  . Hypertension associated with diabetes ([place]) 12/06/2013  . Acute appendicitis 10/25/2013   Outpatient Encounter Prescriptions as of 08/08/2017  Medication Sig  . allopurinol (ZYLOPRIM) 300 MG tablet Take 1 tablet (300 mg total) by mouth daily. For gout prevention  . ALPRAZolam (XANAX) 0.5 MG tablet Take 2 tablets (1 mg total) by mouth at bedtime as needed for anxiety.  . colchicine 0.6 MG tablet Take 1 tablet (0.6 mg total) by mouth 2 (two) times daily. Until symptoms clear.  . dicyclomine (BENTYL) 20 MG tablet Take 0.5- 1  tab 4 times a day PRN before meals and at bedtime.  . Empagliflozin-Metformin HCl ER (SYNJARDY XR) 25-1000 MG TB24 Take 1 tablet by mouth daily.  . fenofibrate 160 MG tablet Take 1 tablet (160 mg total) by mouth daily.  . fluticasone (FLONASE) 50 MCG/ACT nasal spray Place 2 sprays into both nostrils daily.  Marland Kitchen glucose blood test strip Reli-on glucometer.  Test Blood sugar qid Dx. 250.01  . Lancets (ONETOUCH ULTRASOFT) lancets Test 1X per day and prn   Dx 250.01  . levothyroxine (SYNTHROID) 75 MCG tablet Take 1 tablet (75 mcg total) by mouth daily before breakfast.  . lisinopril (PRINIVIL,ZESTRIL) 20 MG tablet Take 1 tablet (20 mg total) by mouth daily.  . Multiple Vitamins-Minerals (ALIVE WOMENS ENERGY PO) Take 1 tablet by mouth daily. Reported on 03/22/2016  . Omega-3 Fatty Acids (OMEGA 3 PO) Take  4 capsules by mouth daily.  Marland Kitchen omeprazole (PRILOSEC) 40 MG capsule Take 1 capsule (40 mg total) by mouth daily.  . potassium chloride (K-DUR) 10 MEQ tablet TAKE 1 TABLET BY MOUTH  DAILY  . Vitamin D, Ergocalciferol, (DRISDOL) 50000 units CAPS capsule TAKE 1 CAPSULE EVERY 7 DAYS  . [DISCONTINUED] cyclobenzaprine (FLEXERIL) 10 MG tablet Take 1 tablet (10 mg total) by mouth 3 (three) times daily as needed for muscle spasms.   No facility-administered encounter medications on file as of 08/08/2017.       Review of Systems    Constitutional: Positive for fatigue.  HENT: Negative.   Eyes: Negative.   Respiratory: Negative.   Cardiovascular: Negative.   Gastrointestinal: Negative.   Endocrine: Negative.   Genitourinary: Negative.   Musculoskeletal: Negative.   Skin: Negative.   Allergic/Immunologic: Negative.   Neurological: Negative.   Hematological: Negative.   Psychiatric/Behavioral: Negative.        Recent feeling down - lost mom recently       Objective:   Physical Exam  Constitutional: She is oriented to person, place, and time. She appears well-developed and well-nourished. She appears distressed.  Morbidly obese pleasant and stressed lady  HENT:  Head: Normocephalic and atraumatic.  Right Ear: External ear normal.  Left Ear: External ear normal.  Mouth/Throat: Oropharynx is clear and moist. No oropharyngeal exudate.  Slight nasal congestion bilaterally  Eyes: Pupils are equal, round, and reactive to light. Conjunctivae and EOM are normal. Right eye exhibits no discharge. Left eye exhibits no discharge. No scleral icterus.  Patient in need of eye exam  Neck: Normal range of motion. Neck supple. No thyromegaly present.  No bruits thyromegaly or anterior cervical adenopathy  Cardiovascular: Normal rate, regular rhythm, normal heart sounds and intact distal pulses.   No murmur heard. The heart is regular at 72/m  Pulmonary/Chest: Effort normal and breath sounds normal. No respiratory distress. She has no wheezes. She has no rales.  Clear anteriorly and posteriorly  Abdominal: Soft. Bowel sounds are normal. She exhibits no mass. There is no tenderness. There is no rebound and no guarding.  Abdomen is morbidly obese without masses tenderness or organ enlargement or bruits  Musculoskeletal: Normal range of motion. She exhibits no edema.  Lymphadenopathy:    She has no cervical adenopathy.  Neurological: She is alert and oriented to person, place, and time. She has normal reflexes. No cranial  nerve deficit.  Skin: Skin is warm and dry. No rash noted.  Psychiatric: She has a normal mood and affect. Her behavior is normal. Judgment and thought content normal.  Nursing note and vitals reviewed.  BP 103/70 (BP Location: Left Arm)   Pulse 83   Temp (!) 97.2 F (36.2 C) (Oral)   Ht 5\' 3"  (1.6 m)   Wt 261 lb (118.4 kg)   LMP 06/29/2017   BMI 46.23 kg/m         Assessment & Plan:  1. Type 2 diabetes mellitus without complication, without long-term current use of insulin (H[facility]) -Continue current meds, check blood sugars regularly, try to lose weight with diet and exercise - EKG 12-Lead  2. Hypertriglyceridemia -Continue current meds pending results of lab work - EKG 12-Lead  3. Hypertension associated with diabetes ([place]) -The blood pressure is good today and she will continue current medication and sodium restriction - EKG 12-Lead  4. Vitamin D deficiency -Continue vitamin D replacement  5. Gastroesophageal reflux disease, esophagitis presence not specified -Continue with omeprazole and diet  of avoiding  6. Iron deficiency anemia, unspecified iron deficiency anemia type -Check CBC and continue with iron replacement if currently taking iron replacement  7. Anxiety and depression -The patient may need to be placed on an antidepressant but we will likely continue with the alprazolam as needed and possibly plan to see her back in 4-6 weeks to see if there's anything else we can do to help her navigate through all of the personal issues she is going through currently  8. Morbid obesity due to excess calories ([place]) -Continue with aggressive weight loss with diet and exercise  9. Diabetic polyneuropathy associated with type 2 diabetes mellitus ([place]) -Continue with current treatment pending results of lab work  No orders of the defined types were placed in this encounter.  Patient Instructions  Continue current medications. Continue good therapeutic lifestyle changes  which include good diet and exercise. Fall precautions discussed with patient. If an FOBT was given today- please return it to our front desk. If you are over 17 years old - you may need Prevnar 70 or the adult Pneumonia vaccine.  **Flu shots are available--- please call and schedule a FLU-CLINIC appointment**  After your visit with [place] today you will receive a survey in the mail or online from [employer] regarding your care with [place]. Please take a moment to fill this out. Your feedback is very important to [place] as you can help [place] better understand your patient needs as well as improve your experience and satisfaction. WE CARE ABOUT YOU!!!   It is very important that you talk with your lawyer and try to get things worked out so your grandmother gets the best care and so that you can also take care of yourself in your own family. We are sorry about the loss of your mother. Do not forget to get your eye exam done Monitor your blood sugars closely and drink plenty of water and do all you can to lose weight through diet and exercise Talk with your minister talk with your uncle and talk with your lawyer and get things worked out to help your grandmother be in a better place so she will be better taken care of Use nasal saline in each nostril to help with nasal congestion Because of the strong family history of autoimmune disease we would recommend getting an appointment to be seen by a rheumatologist to further evaluate you for the possibility of this condition      Arrie Senate MD

## 2017-09-13 ENCOUNTER — Other Ambulatory Visit: Payer: Self-pay | Admitting: *Deleted

## 2017-09-13 ENCOUNTER — Other Ambulatory Visit: Payer: BLUE CROSS/BLUE SHIELD

## 2017-09-13 DIAGNOSIS — E1159 Type 2 diabetes mellitus with other circulatory complications: Secondary | ICD-10-CM

## 2017-09-13 DIAGNOSIS — E119 Type 2 diabetes mellitus without complications: Secondary | ICD-10-CM

## 2017-09-13 DIAGNOSIS — E611 Iron deficiency: Secondary | ICD-10-CM

## 2017-09-13 DIAGNOSIS — E079 Disorder of thyroid, unspecified: Secondary | ICD-10-CM

## 2017-09-13 DIAGNOSIS — I152 Hypertension secondary to endocrine disorders: Secondary | ICD-10-CM

## 2017-09-13 DIAGNOSIS — I10 Essential (primary) hypertension: Principal | ICD-10-CM

## 2017-09-13 DIAGNOSIS — E781 Pure hyperglyceridemia: Secondary | ICD-10-CM

## 2017-09-13 DIAGNOSIS — E039 Hypothyroidism, unspecified: Secondary | ICD-10-CM | POA: Insufficient documentation

## 2017-09-15 LAB — CBC WITH DIFFERENTIAL/PLATELET
BASOS ABS: 0.1 10*3/uL (ref 0.0–0.2)
Basos: 1 %
EOS (ABSOLUTE): 0.1 10*3/uL (ref 0.0–0.4)
Eos: 1 %
Hematocrit: 37.6 % (ref 34.0–46.6)
Hemoglobin: 12.1 g/dL (ref 11.1–15.9)
Immature Grans (Abs): 0 10*3/uL (ref 0.0–0.1)
Immature Granulocytes: 0 %
LYMPHS: 41 %
Lymphocytes Absolute: 4.1 10*3/uL — ABNORMAL HIGH (ref 0.7–3.1)
MCH: 27.6 pg (ref 26.6–33.0)
MCHC: 32.2 g/dL (ref 31.5–35.7)
MCV: 86 fL (ref 79–97)
MONOCYTES: 6 %
Monocytes Absolute: 0.6 10*3/uL (ref 0.1–0.9)
NEUTROS ABS: 5.1 10*3/uL (ref 1.4–7.0)
NEUTROS PCT: 51 %
PLATELETS: 282 10*3/uL (ref 150–379)
RBC: 4.38 x10E6/uL (ref 3.77–5.28)
RDW: 15.7 % — AB (ref 12.3–15.4)
WBC: 10 10*3/uL (ref 3.4–10.8)

## 2017-09-15 LAB — HEPATIC FUNCTION PANEL
ALBUMIN: 4.3 g/dL (ref 3.5–5.5)
ALT: 26 IU/L (ref 0–32)
AST: 23 IU/L (ref 0–40)
Alkaline Phosphatase: 100 IU/L (ref 39–117)
BILIRUBIN TOTAL: 0.2 mg/dL (ref 0.0–1.2)
Bilirubin, Direct: 0.08 mg/dL (ref 0.00–0.40)
Total Protein: 7.1 g/dL (ref 6.0–8.5)

## 2017-09-15 LAB — BMP8+EGFR
BUN/Creatinine Ratio: 26 — ABNORMAL HIGH (ref 9–23)
BUN: 27 mg/dL — AB (ref 6–24)
CALCIUM: 9.5 mg/dL (ref 8.7–10.2)
CHLORIDE: 99 mmol/L (ref 96–106)
CO2: 21 mmol/L (ref 20–29)
Creatinine, Ser: 1.03 mg/dL — ABNORMAL HIGH (ref 0.57–1.00)
GFR, EST AFRICAN AMERICAN: 73 mL/min/{1.73_m2} (ref >59)
GFR, EST NON AFRICAN AMERICAN: 63 mL/min/{1.73_m2} (ref >59)
Glucose: 154 mg/dL — ABNORMAL HIGH (ref 65–99)
Potassium: 4.8 mmol/L (ref 3.5–5.2)
Sodium: 138 mmol/L (ref 134–144)

## 2017-09-15 LAB — LIPID PANEL
CHOL/HDL RATIO: 5.7 ratio — AB (ref 0.0–4.4)
Cholesterol, Total: 217 mg/dL — ABNORMAL HIGH (ref 100–199)
HDL: 38 mg/dL — ABNORMAL LOW (ref >39)
Triglycerides: 533 mg/dL — ABNORMAL HIGH (ref 0–149)

## 2017-09-15 LAB — THYROID PANEL WITH TSH
Free Thyroxine Index: 2.1 (ref 1.2–4.9)
T3 Uptake Ratio: 27 % (ref 24–39)
T4 TOTAL: 7.9 ug/dL (ref 4.5–12.0)
TSH: 1.87 u[IU]/mL (ref 0.450–4.500)

## 2017-09-15 LAB — BAYER DCA HB A1C WAIVED: HB A1C: 7.8 % — AB (ref <7.0)

## 2017-09-15 LAB — VITAMIN D 25 HYDROXY (VIT D DEFICIENCY, FRACTURES): Vit D, 25-Hydroxy: 20.7 ng/mL — ABNORMAL LOW (ref 30.0–100.0)

## 2017-09-17 ENCOUNTER — Other Ambulatory Visit: Payer: Self-pay | Admitting: Family Medicine

## 2017-09-22 ENCOUNTER — Encounter: Payer: Self-pay | Admitting: Family Medicine

## 2017-09-22 ENCOUNTER — Ambulatory Visit: Payer: BLUE CROSS/BLUE SHIELD | Admitting: Family Medicine

## 2017-09-22 VITALS — BP 117/68 | HR 94 | Temp 98.3°F | Ht 63.0 in | Wt 263.0 lb

## 2017-09-22 DIAGNOSIS — E1159 Type 2 diabetes mellitus with other circulatory complications: Secondary | ICD-10-CM

## 2017-09-22 DIAGNOSIS — E781 Pure hyperglyceridemia: Secondary | ICD-10-CM

## 2017-09-22 DIAGNOSIS — I152 Hypertension secondary to endocrine disorders: Secondary | ICD-10-CM

## 2017-09-22 DIAGNOSIS — E079 Disorder of thyroid, unspecified: Secondary | ICD-10-CM | POA: Diagnosis not present

## 2017-09-22 DIAGNOSIS — E119 Type 2 diabetes mellitus without complications: Secondary | ICD-10-CM | POA: Diagnosis not present

## 2017-09-22 DIAGNOSIS — B009 Herpesviral infection, unspecified: Secondary | ICD-10-CM | POA: Diagnosis not present

## 2017-09-22 DIAGNOSIS — E1165 Type 2 diabetes mellitus with hyperglycemia: Secondary | ICD-10-CM | POA: Insufficient documentation

## 2017-09-22 DIAGNOSIS — I10 Essential (primary) hypertension: Secondary | ICD-10-CM

## 2017-09-22 DIAGNOSIS — R8290 Unspecified abnormal findings in urine: Secondary | ICD-10-CM

## 2017-09-22 DIAGNOSIS — Z638 Other specified problems related to primary support group: Secondary | ICD-10-CM

## 2017-09-22 DIAGNOSIS — E1122 Type 2 diabetes mellitus with diabetic chronic kidney disease: Secondary | ICD-10-CM | POA: Insufficient documentation

## 2017-09-22 LAB — URINALYSIS, COMPLETE
BILIRUBIN UA: NEGATIVE
Ketones, UA: NEGATIVE
LEUKOCYTES UA: NEGATIVE
Nitrite, UA: NEGATIVE
PH UA: 5 (ref 5.0–7.5)
PROTEIN UA: NEGATIVE
Specific Gravity, UA: 1.02 (ref 1.005–1.030)
Urobilinogen, Ur: 0.2 mg/dL (ref 0.2–1.0)

## 2017-09-22 LAB — MICROSCOPIC EXAMINATION
BACTERIA UA: NONE SEEN
RENAL EPITHEL UA: NONE SEEN /HPF
WBC, UA: NONE SEEN /hpf (ref 0–?)

## 2017-09-22 MED ORDER — VALACYCLOVIR HCL 1 G PO TABS: 1000.0000 mg | ORAL_TABLET | Freq: Every day | ORAL | 1 refills | Status: DC

## 2017-09-22 NOTE — Patient Instructions (Signed)
If you decide that you need an antidepressant call us back and we will start something like Trintellix 10 mg once daily which does not have any weight gain side effects with it. Continue to make every effort to lose weight Drink plenty of water Stay well-hydrated Get exercise Eat healthy We will call with urinalysis results as soon as those results become available Take the Valtrex 2 g at 1 setting and 12 hours later take 2 mg and this should help knock out the fever blister. In 6 weeks we will recheck a vitamin D level and lipid liver panel and after that period of time hopefully all this medicine will be back in your system and you will have better cholesterol numbers in better vitamin D numbers then.

## 2017-09-22 NOTE — Progress Notes (Signed)
Subjective:    Patient ID: Holly Rodriguez, female    DOB: Dec 29, 1965, 51 y.o.   MRN: [id number]  HPI Patient here today 6 week follow up on depression, anxiety and fatigue.  This patient life has been very complicated with the loss of her mother and still having to take care of her grandmother that lives with her.  She still complains of fatigue anxiety and depression.  She has had lab work done and this will be reviewed with her during the visit today.  This lab work was actually done on 27 October.  The blood sugar was elevated at 154.  The creatinine was also slightly elevated at 1.03.  The electrolytes including potassium are good.  Vitamin D level was very low at 20.7.  All thyroid tests were normal.  Cholesterol numbers triglycerides were elevated the triglyceride was significantly elevated at 533 and a total cholesterol was elevated at 217.  At the LDL could not be calculated because of the high triglyceride level.  Previous triglyceride was 616 this time it was 533.  All liver function tests were within normal limits.  The CBC had a normal white blood cell count a good hemoglobin and an adequate platelet count.  Hemoglobin A1c was 7.8% and this is too high and should be closer to 7.0%.  The patient is alert but still dealing with the stress of losing her mother and taking care of her 73 year old grandmother.  She is fortunate that her daughter has been helping her and she could not provide this caregiving support if it was not for her.  The daughter lives with her grandmother and Holly Rodriguez the patient was about 5 miles away from the grandmother.  She seems to be content now on dealing with this and the best way possible with her daughter's help.  But the patient does have some occasional left-sided chest pain that comes and goes and only lasts momentarily and feels that this is secondary to all the stress she is doing well.  She denies any more shortness of breath than usual.  She denies any nausea  vomiting diarrhea blood in the stool or black tarry bowel movements.  She may have some occasional nausea but not anything of a significant nature.  She has urinary frequency and this is not unusual for her and she has not been drinking  as much water as she should and says she will try to do better.  We will check a urine on this.  She also complains of ongoing fever blisters and were going to try Valtrex for this to take as needed but did take 2 g twice daily with 2 doses like this.  In reviewing all of her lab work she indicates she had not been taking the fenofibrate and she had not been taking her vitamin D and all of this together could certainly have played a role with her elevated blood sugar and her elevated cholesterol numbers.  We have decided after consulting with her today in reviewing the notes of we will wait until about 6 weeks of taking the medicine regularly and then check a fasting lipid liver panel and vitamin D level at that time.     Patient Active Problem List   Diagnosis Date Noted  . Thyroid disease 09/13/2017  . Hypertriglyceridemia 03/22/2016  . Grief at loss of child 02/03/2016  . Morbid obesity ([place]) 11/15/2014  . Colon polyps 08/03/2014  . Hemorrhoids, external 08/03/2014  . Prolapsed internal hemorrhoids, grade  1 08/03/2014  . Iron deficiency 06/22/2014  . Type 2 diabetes mellitus not at goal [facility]) 12/06/2013  . Hypertension associated with diabetes ([place]) 12/06/2013  . Acute appendicitis 10/25/2013   Outpatient Encounter Medications as of 09/22/2017  Medication Sig  . allopurinol (ZYLOPRIM) 300 MG tablet Take 1 tablet (300 mg total) by mouth daily. For gout prevention  . ALPRAZolam (XANAX) 0.5 MG tablet Take 2 tablets (1 mg total) by mouth at bedtime as needed for anxiety.  . colchicine 0.6 MG tablet Take 1 tablet (0.6 mg total) by mouth 2 (two) times daily. Until symptoms clear.  . dicyclomine (BENTYL) 20 MG tablet Take 0.5- 1  tab 4 times a day PRN before meals  and at bedtime.  . Empagliflozin-Metformin HCl ER (SYNJARDY XR) 25-1000 MG TB24 Take 1 tablet by mouth daily.  . fenofibrate 160 MG tablet TAKE ONE (1) TABLET EACH DAY  . fluticasone (FLONASE) 50 MCG/ACT nasal spray Place 2 sprays into both nostrils daily.  Marland Kitchen glucose blood test strip Reli-on glucometer.  Test Blood sugar qid Dx. 250.01  . Lancets (ONETOUCH ULTRASOFT) lancets Test 1X per day and prn   Dx 250.01  . levothyroxine (SYNTHROID) 75 MCG tablet Take 1 tablet (75 mcg total) by mouth daily before breakfast.  . lisinopril (PRINIVIL,ZESTRIL) 20 MG tablet Take 1 tablet (20 mg total) by mouth daily.  . Multiple Vitamins-Minerals (ALIVE WOMENS ENERGY PO) Take 1 tablet by mouth daily. Reported on 03/22/2016  . Omega-3 Fatty Acids (OMEGA 3 PO) Take 4 capsules by mouth daily.  Marland Kitchen omeprazole (PRILOSEC) 40 MG capsule Take 1 capsule (40 mg total) by mouth daily.  . potassium chloride (K-DUR) 10 MEQ tablet TAKE 1 TABLET BY MOUTH  DAILY  . Vitamin D, Ergocalciferol, (DRISDOL) 50000 units CAPS capsule TAKE 1 CAPSULE EVERY 7 DAYS   No facility-administered encounter medications on file as of 09/22/2017.       Review of Systems  Constitutional: Positive for fatigue.  HENT: Negative.   Eyes: Negative.   Respiratory: Negative.   Cardiovascular: Negative.   Gastrointestinal: Negative.   Endocrine: Negative.   Genitourinary: Negative.   Musculoskeletal: Positive for arthralgias.  Skin: Negative.   Allergic/Immunologic: Negative.   Neurological: Negative.   Hematological: Negative.   Psychiatric/Behavioral: Negative.        Anxiety and depression        Objective:   Physical Exam  Constitutional: She is oriented to person, place, and time. She appears well-developed and well-nourished. No distress.  Patient today appears to be more pleasant and calm even though she is still dealing with a lot of stress in her family with losing her mother and having to take care of her grandmother.  HENT:    Head: Normocephalic and atraumatic.  Right Ear: External ear normal.  Left Ear: External ear normal.  Nose: Nose normal.  Mouth/Throat: Oropharynx is clear and moist.  Eyes: Conjunctivae and EOM are normal. Pupils are equal, round, and reactive to light. Right eye exhibits no discharge. Left eye exhibits no discharge. No scleral icterus.  Neck: Normal range of motion. Neck supple. No thyromegaly present.  No bruits thyromegaly or anterior cervical adenopathy  Cardiovascular: Normal rate, regular rhythm and normal heart sounds.  No murmur heard. Heart is regular at 84/min  Pulmonary/Chest: Effort normal and breath sounds normal. No respiratory distress. She has no wheezes. She has no rales.  Clear anteriorly and posteriorly  Abdominal: Soft. Bowel sounds are normal. She exhibits no mass. There is no  tenderness. There is no rebound and no guarding.  Abdominal obesity without liver or spleen enlargement masses or bruits  Musculoskeletal: Normal range of motion. She exhibits no edema.  Lymphadenopathy:    She has no cervical adenopathy.  Neurological: She is alert and oriented to person, place, and time.  Skin: Skin is warm and dry. Rash noted. There is erythema.  Early fever blister right upper lip  Psychiatric: She has a normal mood and affect. Her behavior is normal. Judgment and thought content normal.  Nursing note and vitals reviewed.   BP 117/68 (BP Location: Left Arm)   Pulse 94   Temp 98.3 F (36.8 C) (Oral)   Ht 5\' 3"  (1.6 m)   Wt 263 lb (119.3 kg)   BMI 46.59 kg/m        Assessment & Plan:  1. Abnormal urine odor -Also has frequency.  No treatment until urinalysis results are available - Urine Culture - Urinalysis, Complete  2. Thyroid disease -She recently had a thyroid profile and all of the thyroid numbers were good and she will continue with her current thyroid treatment  3. Hypertension associated with diabetes ([place]) -Blood pressure is good today and she  will continue with current treatment  4. Hypertriglyceridemia -Triglycerides were very elevated and the patient is now back on her fenofibrate.  We will not make any further changes until we can recheck another fasting lipid liver panel in about 6 weeks.  The patient will try to do better with diet and exercise and drinking more water  5. Morbid obesity ([place]) -She understands the risk of her weight and promises to do better with diet and exercise and weight loss  6. Type 2 diabetes mellitus without complication, without long-term current use of insulin ([place]) -She will be taking her medication more regularly and once again we will recheck all of her numbers in 6 weeks.  7. Stress due to family tension -Although the stress and tension are still there the patient seems to be handling it better today and she does not want to start any kind of antidepressant at this time.  8. Recurrent herpes simplex -Valtrex 2 g twice daily with a total of 4 g daily for 1 time treatment with refills  Meds ordered this encounter  Medications  . valACYclovir (VALTREX) 1000 MG tablet    Sig: Take 1 tablet (1,000 mg total) daily by mouth. As directed    Dispense:  30 tablet    Refill:  1   Patient Instructions  If you decide that you need an antidepressant call us back and we will start something like Trintellix 10 mg once daily which does not have any weight gain side effects with it. Continue to make every effort to lose weight Drink plenty of water Stay well-hydrated Get exercise Eat healthy We will call with urinalysis results as soon as those results become available Take the Valtrex 2 g at 1 setting and 12 hours later take 2 mg and this should help knock out the fever blister. In 6 weeks we will recheck a vitamin D level and lipid liver panel and after that period of time hopefully all this medicine will be back in your system and you will have better cholesterol numbers in better vitamin D numbers  then.  Arrie Senate MD

## 2017-09-23 LAB — URINE CULTURE

## 2017-09-25 ENCOUNTER — Other Ambulatory Visit: Payer: Self-pay | Admitting: *Deleted

## 2017-09-25 DIAGNOSIS — F411 Generalized anxiety disorder: Secondary | ICD-10-CM

## 2017-09-25 DIAGNOSIS — H04123 Dry eye syndrome of bilateral lacrimal glands: Secondary | ICD-10-CM | POA: Diagnosis not present

## 2017-09-25 DIAGNOSIS — E119 Type 2 diabetes mellitus without complications: Secondary | ICD-10-CM | POA: Diagnosis not present

## 2017-09-25 DIAGNOSIS — D3131 Benign neoplasm of right choroid: Secondary | ICD-10-CM | POA: Diagnosis not present

## 2017-09-25 DIAGNOSIS — H2513 Age-related nuclear cataract, bilateral: Secondary | ICD-10-CM | POA: Diagnosis not present

## 2017-09-25 MED ORDER — ALPRAZOLAM 0.5 MG PO TABS: 1.0000 mg | ORAL_TABLET | Freq: Every evening | ORAL | 0 refills | Status: DC | PRN

## 2017-09-25 NOTE — Telephone Encounter (Signed)
Please call in xanax with 1 refills 

## 2017-09-26 NOTE — Addendum Note (Signed)
Addended by: Zannie Cove on: 09/26/2017 07:48 AM   Modules accepted: Orders

## 2017-09-26 NOTE — Telephone Encounter (Signed)
Pt aware - will call to the drug store in [place]

## 2017-10-06 ENCOUNTER — Other Ambulatory Visit: Payer: BLUE CROSS/BLUE SHIELD

## 2017-10-06 DIAGNOSIS — Z87440 Personal history of urinary (tract) infections: Secondary | ICD-10-CM | POA: Diagnosis not present

## 2017-10-06 LAB — MICROSCOPIC EXAMINATION
Epithelial Cells (non renal): >10 /hpf — AB (ref 0–10)
RENAL EPITHEL UA: NONE SEEN /HPF
WBC UA: NONE SEEN /HPF (ref 0–?)

## 2017-10-06 LAB — URINALYSIS, COMPLETE
Bilirubin, UA: NEGATIVE
Ketones, UA: NEGATIVE
Leukocytes, UA: NEGATIVE
NITRITE UA: NEGATIVE
PH UA: 5 (ref 5.0–7.5)
PROTEIN UA: NEGATIVE
Specific Gravity, UA: 1.02 (ref 1.005–1.030)
UUROB: 0.2 mg/dL (ref 0.2–1.0)

## 2017-10-08 LAB — URINE CULTURE

## 2017-10-14 ENCOUNTER — Telehealth: Payer: Self-pay | Admitting: Family Medicine

## 2017-10-14 DIAGNOSIS — R3129 Other microscopic hematuria: Secondary | ICD-10-CM

## 2017-10-14 NOTE — Telephone Encounter (Signed)
Referral placed for patient per Lab visit on 11/19

## 2017-10-17 ENCOUNTER — Telehealth: Payer: Self-pay | Admitting: Family Medicine

## 2017-10-17 NOTE — Telephone Encounter (Signed)
Patient states that last night she took her bs before bed and it was 367. This morning her fasting BS was 257. She is currently at work and does not have her meter with her.

## 2017-10-17 NOTE — Telephone Encounter (Signed)
Change from Synjardy 25 mg /1001 daily to Synjardy 12.5 mg/1000 twice daily. Record blood sugars more frequently and bring readings by for review next week

## 2017-10-18 ENCOUNTER — Other Ambulatory Visit: Payer: Self-pay | Admitting: Family Medicine

## 2017-10-20 ENCOUNTER — Other Ambulatory Visit: Payer: Self-pay

## 2017-10-20 MED ORDER — EMPAGLIFLOZIN-METFORMIN HCL ER 12.5-1000 MG PO TB24: 12.5000 mg | ORAL_TABLET | Freq: Two times a day (BID) | ORAL | 2 refills | Status: DC

## 2017-10-20 NOTE — Telephone Encounter (Signed)
Sent new rx to [facility] per patients request. Patient given new instructions on change in medications and she verbalized understanding

## 2017-10-23 ENCOUNTER — Telehealth: Payer: Self-pay | Admitting: Family Medicine

## 2017-10-23 DIAGNOSIS — M1A09X0 Idiopathic chronic gout, multiple sites, without tophus (tophi): Secondary | ICD-10-CM | POA: Diagnosis not present

## 2017-10-23 DIAGNOSIS — M2550 Pain in unspecified joint: Secondary | ICD-10-CM | POA: Diagnosis not present

## 2017-10-23 NOTE — Telephone Encounter (Signed)
Patient aware and would like to speak Monday when she comes back in.

## 2017-10-29 ENCOUNTER — Other Ambulatory Visit: Payer: Self-pay | Admitting: *Deleted

## 2017-10-29 DIAGNOSIS — R7989 Other specified abnormal findings of blood chemistry: Secondary | ICD-10-CM

## 2017-10-29 NOTE — Telephone Encounter (Signed)
appt changed

## 2017-11-04 ENCOUNTER — Ambulatory Visit: Payer: BLUE CROSS/BLUE SHIELD | Admitting: Family Medicine

## 2017-11-05 DIAGNOSIS — N3946 Mixed incontinence: Secondary | ICD-10-CM | POA: Diagnosis not present

## 2017-11-05 DIAGNOSIS — R3121 Asymptomatic microscopic hematuria: Secondary | ICD-10-CM | POA: Diagnosis not present

## 2017-11-06 DIAGNOSIS — S134XXA Sprain of ligaments of cervical spine, initial encounter: Secondary | ICD-10-CM | POA: Diagnosis not present

## 2017-11-06 DIAGNOSIS — S233XXA Sprain of ligaments of thoracic spine, initial encounter: Secondary | ICD-10-CM | POA: Diagnosis not present

## 2017-11-06 DIAGNOSIS — M546 Pain in thoracic spine: Secondary | ICD-10-CM | POA: Diagnosis not present

## 2017-11-13 DIAGNOSIS — M546 Pain in thoracic spine: Secondary | ICD-10-CM | POA: Diagnosis not present

## 2017-11-13 DIAGNOSIS — S134XXA Sprain of ligaments of cervical spine, initial encounter: Secondary | ICD-10-CM | POA: Diagnosis not present

## 2017-11-13 DIAGNOSIS — S233XXA Sprain of ligaments of thoracic spine, initial encounter: Secondary | ICD-10-CM | POA: Diagnosis not present

## 2017-11-19 ENCOUNTER — Ambulatory Visit: Payer: BLUE CROSS/BLUE SHIELD | Admitting: Family Medicine

## 2017-11-19 VITALS — BP 114/76 | HR 86 | Temp 99.3°F | Ht 63.0 in | Wt 258.0 lb

## 2017-11-19 DIAGNOSIS — J069 Acute upper respiratory infection, unspecified: Secondary | ICD-10-CM | POA: Diagnosis not present

## 2017-11-19 MED ORDER — METHYLPREDNISOLONE ACETATE 80 MG/ML IJ SUSP: 80.0000 mg | Freq: Once | INTRAMUSCULAR | Status: DC

## 2017-11-19 MED ORDER — BENZONATATE 200 MG PO CAPS: 200.0000 mg | ORAL_CAPSULE | Freq: Two times a day (BID) | ORAL | 0 refills | Status: DC | PRN

## 2017-11-19 MED ORDER — METHYLPREDNISOLONE ACETATE 80 MG/ML IJ SUSP
40.0000 mg | Freq: Once | INTRAMUSCULAR | Status: AC
  Administered 2017-11-19: 40 mg via INTRAMUSCULAR

## 2017-11-19 NOTE — Addendum Note (Signed)
Addended byCarrolyn Leigh on: 11/19/2017 03:24 PM   Modules accepted: Orders

## 2017-11-19 NOTE — Progress Notes (Signed)
Subjective: CC: URI PCP: Chipper Herb, MD NWG:NFAOZ Rylann Munford is a 52 y.o. female presenting to clinic today for:  1. Cold symptoms  Patient reports sinus pressure, nasal drainage, cough and subjective fever that started over the weekend.  She reports intermittent nonbloody diarrhea.  She notes that she had a child that was sick with similar.  Denies hemoptysis, SOB, dizziness, rash, nausea, vomiting, chills, myalgia, sick contacts, recent travel.  Patient has used Mucinex and an over-the-counter sinus medication with little relief of symptoms.  Denies history of COPD or asthma.  She reports cigarette smoke exposure.  She notes that she has missed Monday and today secondary to symptoms.  ROS: Per HPI  No Known Allergies Past Medical History:  Diagnosis Date  . Anxiety   . Arthritis    Gout  . Diabetes mellitus without complication ([place])   . GERD (gastroesophageal reflux disease)   . Gout   . IBS (irritable bowel syndrome)   . Sleep apnea   . Thyroid disease     Current Outpatient Medications:  .  allopurinol (ZYLOPRIM) 300 MG tablet, TAKE 1 TABLET BY MOUTH  DAILY. FOR GOUT PREVENTION, Disp: 90 tablet, Rfl: 1 .  colchicine 0.6 MG tablet, Take 1 tablet (0.6 mg total) by mouth 2 (two) times daily. Until symptoms clear., Disp: 90 tablet, Rfl: 0 .  dicyclomine (BENTYL) 20 MG tablet, Take 0.5- 1  tab 4 times a day PRN before meals and at bedtime., Disp: 120 tablet, Rfl: 1 .  Empagliflozin-Metformin HCl ER (SYNJARDY XR) 12.03-999 MG TB24, Take 12.5 mg by mouth 2 (two) times daily., Disp: 60 tablet, Rfl: 2 .  Empagliflozin-Metformin HCl ER (SYNJARDY XR) 25-1000 MG TB24, Take 1 tablet by mouth daily., Disp: 30 tablet, Rfl: 2 .  fenofibrate 160 MG tablet, TAKE ONE (1) TABLET EACH DAY, Disp: 90 tablet, Rfl: 1 .  fluticasone (FLONASE) 50 MCG/ACT nasal spray, Place 2 sprays into both nostrils daily., Disp: 16 g, Rfl: 6 .  glucose blood test strip, Reli-on glucometer.  Test Blood sugar  qid Dx. 250.01, Disp: , Rfl:  .  Lancets (ONETOUCH ULTRASOFT) lancets, Test 1X per day and prn   Dx 250.01, Disp: 100 each, Rfl: 12 .  levothyroxine (SYNTHROID, LEVOTHROID) 75 MCG tablet, TAKE 1 TABLET BY MOUTH  DAILY BEFORE BREAKFAST, Disp: 90 tablet, Rfl: 1 .  lisinopril (PRINIVIL,ZESTRIL) 20 MG tablet, TAKE 1 TABLET BY MOUTH  DAILY, Disp: 90 tablet, Rfl: 1 .  Multiple Vitamins-Minerals (ALIVE WOMENS ENERGY PO), Take 1 tablet by mouth daily. Reported on 03/22/2016, Disp: , Rfl:  .  Omega-3 Fatty Acids (OMEGA 3 PO), Take 4 capsules by mouth daily., Disp: , Rfl:  .  omeprazole (PRILOSEC) 40 MG capsule, TAKE 1 CAPSULE BY MOUTH  DAILY, Disp: 90 capsule, Rfl: 1 .  potassium chloride (K-DUR) 10 MEQ tablet, TAKE 1 TABLET BY MOUTH  DAILY, Disp: 90 tablet, Rfl: 3 .  valACYclovir (VALTREX) 1000 MG tablet, Take 1 tablet (1,000 mg total) daily by mouth. As directed, Disp: 30 tablet, Rfl: 1 .  Vitamin D, Ergocalciferol, (DRISDOL) 50000 units CAPS capsule, TAKE 1 CAPSULE EVERY 7 DAYS, Disp: 12 capsule, Rfl: 3 Social History   Socioeconomic History  . Marital status: Single    Spouse name: Not on file  . Number of children: Not on file  . Years of education: Not on file  . Highest education level: Not on file  Social Needs  . Financial resource strain: Not on file  .  Food insecurity - worry: Not on file  . Food insecurity - inability: Not on file  . Transportation needs - medical: Not on file  . Transportation needs - non-medical: Not on file  Occupational History  . Not on file  Tobacco Use  . Smoking status: Never Smoker  . Smokeless tobacco: Never Used  Substance and Sexual Activity  . Alcohol use: Yes    Comment: occasional  . Drug use: No  . Sexual activity: Not on file  Other Topics Concern  . Not on file  Social History Narrative  . Not on file   Family History  Problem Relation Age of Onset  . Arthritis Mother   . Fibromyalgia Mother   . Hyperlipidemia Mother   . Arthritis  Maternal Grandmother   . Heart disease Maternal Grandmother   . Alzheimer's disease Maternal Grandfather   . Gout Daughter   . Gout Son     Objective: Office vital signs reviewed. BP 114/76   Pulse 86   Temp 99.3 F (37.4 C) (Oral)   Ht 5\' 3"  (1.6 m)   Wt 258 lb (117 kg)   SpO2 95%   BMI 45.70 kg/m   Physical Examination:  General: Awake, alert, well nourished, nontoxic, No acute distress HEENT: Mild tenderness to palpation to the frontal and maxillary sinuses    Neck: No masses palpated.  Mild enlargement of the submandibular lymph nodes appreciated.  These are nontender.    Ears: Tympanic membranes intact, normal light reflex, no erythema, no bulging    Eyes: PERRLA, extraocular membranes intact, sclera white, no ocular discharge    Nose: nasal turbinates moist, pale and edematous, with clear nasal discharge    Throat: moist mucus membranes, no erythema, no tonsillar exudate.  Airway is patent Cardio: regular rate and rhythm, S1S2 heard, no murmurs appreciated Pulm: clear to auscultation bilaterally, no wheezes, rhonchi or rales; normal work of breathing on room air  Assessment/ Plan: 52 y.o. female   1. URI with cough and congestion Patient is well-appearing with low-grade fever to 99.3 F here in office.  Vital signs otherwise normal.  Her exam was remarkable for mild tenderness to palpation of both the frontal and maxillary sinuses with associated mild enlargement of the submandibular lymph nodes.  Her exam was otherwise unremarkable.  There is nothing to suggest bacterial infection at this time.  I recommended supportive care.  Tessalon Perles were prescribed for cough suppressants.  Push oral fluids.  Depo-Medrol 40 mg IM administered during today's office visit in efforts to improve symptoms.  Recommend Afrin nasal spray as needed nasal congestion at night for no longer than 3 days.  Home care instructions reviewed with the patient.  Work note was provided excusing from  absences.she may return to work on 11/21/2017.  Strict return precautions and reasons for emergent evaluation in the emergency department review with patient.  She voiced understanding and will follow-up as needed.   Meds ordered this encounter  Medications  . benzonatate (TESSALON) 200 MG capsule    Sig: Take 1 capsule (200 mg total) by mouth 2 (two) times daily as needed for cough.    Dispense:  20 capsule    Refill:  [place], DO [place] [phone]

## 2017-11-19 NOTE — Patient Instructions (Addendum)
It appears that you have a viral upper respiratory infection (cold).  Cold symptoms can last up to 2 weeks.  I have prescribed you Tessalon Perles to take up to twice daily if needed for cough.  This should not impact your blood sugar.  However, you were given a dose of steroid today to reduce inflammation and improve symptoms.  This may affect your blood sugar for the next couple of days.  You can use naproxen twice a day if needed for headache or fever.  If you develop any other worrisome symptoms or signs that we discussed, outlined below, please contact our office.  - Get plenty of rest and drink plenty of fluids. - Try to breathe moist air. Use a cold mist humidifier. - Consume warm fluids (soup or tea) to provide relief for a stuffy nose and to loosen phlegm. - For nasal stuffiness, try saline nasal spray or a Neti Pot. Afrin nasal spray can also be used but this product should not be used longer than 3 days or it will cause rebound nasal stuffiness (worsening nasal congestion). - For sore throat pain relief: suck on throat lozenges, hard candy or popsicles; gargle with warm salt water (1/4 tsp. salt per 8 oz. of water); and eat soft, bland foods. - Eat a well-balanced diet. If you cannot, ensure you are getting enough nutrients by taking a daily multivitamin. - Avoid dairy products, as they can thicken phlegm. - Avoid alcohol, as it impairs your body's immune system.  CONTACT YOUR DOCTOR IF YOU EXPERIENCE ANY OF THE FOLLOWING: - High fever - Ear pain - Sinus-type headache - Unusually severe cold symptoms - Cough that gets worse while other cold symptoms improve - Flare up of any chronic lung problem, such as asthma - Your symptoms persist longer than 2 weeks

## 2017-11-26 DIAGNOSIS — S233XXA Sprain of ligaments of thoracic spine, initial encounter: Secondary | ICD-10-CM | POA: Diagnosis not present

## 2017-11-26 DIAGNOSIS — S134XXA Sprain of ligaments of cervical spine, initial encounter: Secondary | ICD-10-CM | POA: Diagnosis not present

## 2017-11-26 DIAGNOSIS — M546 Pain in thoracic spine: Secondary | ICD-10-CM | POA: Diagnosis not present

## 2017-12-05 DIAGNOSIS — R3121 Asymptomatic microscopic hematuria: Secondary | ICD-10-CM | POA: Diagnosis not present

## 2017-12-05 DIAGNOSIS — K760 Fatty (change of) liver, not elsewhere classified: Secondary | ICD-10-CM | POA: Diagnosis not present

## 2017-12-11 ENCOUNTER — Ambulatory Visit: Payer: BLUE CROSS/BLUE SHIELD | Admitting: Family Medicine

## 2017-12-11 ENCOUNTER — Encounter: Payer: Self-pay | Admitting: Family Medicine

## 2017-12-11 DIAGNOSIS — E119 Type 2 diabetes mellitus without complications: Secondary | ICD-10-CM

## 2017-12-11 DIAGNOSIS — E079 Disorder of thyroid, unspecified: Secondary | ICD-10-CM | POA: Diagnosis not present

## 2017-12-11 DIAGNOSIS — E559 Vitamin D deficiency, unspecified: Secondary | ICD-10-CM | POA: Diagnosis not present

## 2017-12-11 DIAGNOSIS — E611 Iron deficiency: Secondary | ICD-10-CM

## 2017-12-11 DIAGNOSIS — I10 Essential (primary) hypertension: Secondary | ICD-10-CM

## 2017-12-11 DIAGNOSIS — E1159 Type 2 diabetes mellitus with other circulatory complications: Secondary | ICD-10-CM | POA: Diagnosis not present

## 2017-12-11 DIAGNOSIS — E781 Pure hyperglyceridemia: Secondary | ICD-10-CM

## 2017-12-11 DIAGNOSIS — Z638 Other specified problems related to primary support group: Secondary | ICD-10-CM

## 2017-12-11 DIAGNOSIS — I152 Hypertension secondary to endocrine disorders: Secondary | ICD-10-CM

## 2017-12-11 NOTE — Patient Instructions (Addendum)
Continue current medications. Continue good therapeutic lifestyle changes which include good diet and exercise. Fall precautions discussed with patient. If an FOBT was given today- please return it to our front desk. If you are over 52 years old - you may need Prevnar 40 or the adult Pneumonia vaccine.  **Flu shots are available--- please call and schedule a FLU-CLINIC appointment**  After your visit with [place] today you will receive a survey in the mail or online from [employer] regarding your care with [place]. Please take a moment to fill this out. Your feedback is very important to [place] as you can help [place] better understand your patient needs as well as improve your experience and satisfaction. WE CARE ABOUT YOU!!!   Stay active physically and follow diet regimen as closely as possible and get plenty of exercise to control weight and keep blood sugar under control. Watch sodium intake Please return to the office for fasting blood work as planned Return the FOBT. Reconsider getting the flu shot and the Prevnar vaccine. Please call Dr. Watt Climes and schedule the visit for your colonoscopy and tell him about your persistent heartburn as he may want to do an endoscopy at the same time. Keep your follow-up visit with the urologist because of the blood in the urine

## 2017-12-11 NOTE — Progress Notes (Signed)
Subjective:    Patient ID: Holly Rodriguez, female    DOB: 09/28/1966, 52 y.o.   MRN: [id number]  HPI  Pt here for follow up of chronic medical problems which include diabetes, hyperlipidemia, hypothyroidism, GERD and hypertension.  Patient is doing well today with no specific complaints.  She will be given an FOBT to return.  There is a future order this been placed to get her lab work after the 28th of this month.  She refuses to do the flu shot or the Prevnar.  She does need to get a DEXA scan.  She is a diabetic.  He has multiple diagnoses including gout GERD diabetes sleep apnea and thyroid disease.  The patient's mother passed away during the past year and she has been under a lot of stress dealing with this loss.  Patient is working on getting her grandmother and to a nursing home and this will take a lot of stress off of her.  She has been notified by the gastroenterologist that she does need to call and set up an appointment for a colonoscopy.  Because of her ongoing problems with heartburn and indigestion she will also need to discuss with him the need for an endoscopy at the same time.  She denies any chest pain or shortness of breath.  She does have ongoing heartburn and indigestion despite taking her omeprazole 40 mg.  She is taking this after breakfast so of asked her to switch it to before breakfast to see if this may help some.  She also had blood in the urine and she is having her bladder checked out and has had a CT scan that was negative.  She has another appointment with the urologist to follow-up for the blood in the urine.  She indicates that her blood sugar this morning fasting was 164.      Review of Systems     Objective:   Physical Exam  Constitutional: She is oriented to person, place, and time. She appears well-developed and well-nourished. No distress.  The patient is pleasant and relaxed and seems to have a more positive feeling about taking care of her grandmother  in a nursing home  HENT:  Head: Normocephalic and atraumatic.  Right Ear: External ear normal.  Left Ear: External ear normal.  Nose: Nose normal.  Mouth/Throat: Oropharynx is clear and moist. No oropharyngeal exudate.  Eyes: Conjunctivae and EOM are normal. Pupils are equal, round, and reactive to light. Right eye exhibits no discharge. Left eye exhibits no discharge. No scleral icterus.  Neck: Normal range of motion. Neck supple. No thyromegaly present.  No bruits thyromegaly or anterior cervical adenopathy  Cardiovascular: Normal rate, regular rhythm, normal heart sounds and intact distal pulses.  No murmur heard. Heart has a regular rate and rhythm at 72/min  Pulmonary/Chest: Effort normal and breath sounds normal. No respiratory distress. She has no wheezes. She has no rales.  Clear anteriorly and posteriorly  Abdominal: Soft. Bowel sounds are normal. She exhibits no mass. There is no tenderness. There is no rebound and no guarding.  Abdominal obesity without organ enlargement masses or bruits  Musculoskeletal: Normal range of motion. She exhibits no edema.  Lymphadenopathy:    She has no cervical adenopathy.  Neurological: She is alert and oriented to person, place, and time. She has normal reflexes. No cranial nerve deficit.  Skin: Skin is warm and dry. No rash noted.  Dry skin on both feet  Psychiatric: She has a normal  mood and affect. Her behavior is normal. Judgment and thought content normal.  Nursing note and vitals reviewed.  BP 111/70 (BP Location: Left Arm, Patient Position: Sitting, Cuff Size: Large)   Pulse 75   Temp (!) 97 F (36.1 C) (Oral)   Ht 5\' 3"  (1.6 m)   Wt 256 lb (116.1 kg)   BMI 45.35 kg/m        Assessment & Plan:  1. Morbid obesity ([place]) -Continue to make all efforts at weight loss through diet and exercise  2. Thyroid disease -Continue current treatment pending results of lab work  3. Type 2 diabetes mellitus without complication, without  long-term current use of insulin ([facility]) -Continue with aggressive therapeutic lifestyle changes and regular blood sugar checks and foot checks  4. Vitamin D deficiency -Continue with current treatment pending results of lab work  5. Hypertriglyceridemia -Continue with fenofibrate and omega-3 fatty acids pending results of lab work and consider switching to Vascepa  6. Iron deficiency -Continue iron replacement pending results of CBC  7. Stress due to family tension -Continue to work positively on the stressing by getting her grandmother under better control and taking care of the multiple medical issues that you are needing to get taken care of including your colonoscopy and endoscopy visit with the urologist etc.  8. Hypertension associated with diabetes ([place]) -The blood pressure is good today and she will continue with current treatment  Patient Instructions  Continue current medications. Continue good therapeutic lifestyle changes which include good diet and exercise. Fall precautions discussed with patient. If an FOBT was given today- please return it to our front desk. If you are over 18 years old - you may need Prevnar 62 or the adult Pneumonia vaccine.  **Flu shots are available--- please call and schedule a FLU-CLINIC appointment**  After your visit with [place] today you will receive a survey in the mail or online from [employer] regarding your care with [place]. Please take a moment to fill this out. Your feedback is very important to [place] as you can help [place] better understand your patient needs as well as improve your experience and satisfaction. WE CARE ABOUT YOU!!!   Stay active physically and follow diet regimen as closely as possible and get plenty of exercise to control weight and keep blood sugar under control. Watch sodium intake Please return to the office for fasting blood work as planned Return the FOBT. Reconsider getting the flu shot and the Prevnar vaccine. Please call  Dr. Watt Climes and schedule the visit for your colonoscopy and tell him about your persistent heartburn as he may want to do an endoscopy at the same time. Keep your follow-up visit with the urologist because of the blood in the urine  Arrie Senate MD

## 2017-12-19 DIAGNOSIS — R3121 Asymptomatic microscopic hematuria: Secondary | ICD-10-CM | POA: Diagnosis not present

## 2017-12-23 ENCOUNTER — Other Ambulatory Visit: Payer: Self-pay | Admitting: Family Medicine

## 2017-12-24 DIAGNOSIS — S233XXA Sprain of ligaments of thoracic spine, initial encounter: Secondary | ICD-10-CM | POA: Diagnosis not present

## 2017-12-24 DIAGNOSIS — S134XXA Sprain of ligaments of cervical spine, initial encounter: Secondary | ICD-10-CM | POA: Diagnosis not present

## 2017-12-24 DIAGNOSIS — M546 Pain in thoracic spine: Secondary | ICD-10-CM | POA: Diagnosis not present

## 2017-12-25 DIAGNOSIS — E039 Hypothyroidism, unspecified: Secondary | ICD-10-CM | POA: Diagnosis not present

## 2017-12-25 DIAGNOSIS — Z6841 Body Mass Index (BMI) 40.0 and over, adult: Secondary | ICD-10-CM | POA: Diagnosis not present

## 2017-12-25 DIAGNOSIS — Z01419 Encounter for gynecological examination (general) (routine) without abnormal findings: Secondary | ICD-10-CM | POA: Diagnosis not present

## 2017-12-25 DIAGNOSIS — Z1231 Encounter for screening mammogram for malignant neoplasm of breast: Secondary | ICD-10-CM | POA: Diagnosis not present

## 2017-12-25 DIAGNOSIS — Z13220 Encounter for screening for lipoid disorders: Secondary | ICD-10-CM | POA: Diagnosis not present

## 2017-12-25 DIAGNOSIS — N946 Dysmenorrhea, unspecified: Secondary | ICD-10-CM | POA: Diagnosis not present

## 2017-12-25 DIAGNOSIS — E119 Type 2 diabetes mellitus without complications: Secondary | ICD-10-CM | POA: Diagnosis not present

## 2018-01-16 ENCOUNTER — Other Ambulatory Visit: Payer: Self-pay | Admitting: Family Medicine

## 2018-02-17 ENCOUNTER — Other Ambulatory Visit: Payer: Self-pay | Admitting: Family Medicine

## 2018-04-15 ENCOUNTER — Ambulatory Visit (INDEPENDENT_AMBULATORY_CARE_PROVIDER_SITE_OTHER): Payer: BLUE CROSS/BLUE SHIELD

## 2018-04-15 ENCOUNTER — Ambulatory Visit: Payer: BLUE CROSS/BLUE SHIELD | Admitting: Family Medicine

## 2018-04-15 ENCOUNTER — Encounter: Payer: Self-pay | Admitting: Family Medicine

## 2018-04-15 VITALS — BP 118/76 | HR 83 | Temp 97.4°F | Ht 63.0 in | Wt 257.0 lb

## 2018-04-15 DIAGNOSIS — E559 Vitamin D deficiency, unspecified: Secondary | ICD-10-CM | POA: Diagnosis not present

## 2018-04-15 DIAGNOSIS — E079 Disorder of thyroid, unspecified: Secondary | ICD-10-CM | POA: Diagnosis not present

## 2018-04-15 DIAGNOSIS — Z780 Asymptomatic menopausal state: Secondary | ICD-10-CM | POA: Diagnosis not present

## 2018-04-15 DIAGNOSIS — E611 Iron deficiency: Secondary | ICD-10-CM

## 2018-04-15 DIAGNOSIS — E781 Pure hyperglyceridemia: Secondary | ICD-10-CM | POA: Diagnosis not present

## 2018-04-15 DIAGNOSIS — Z13820 Encounter for screening for osteoporosis: Secondary | ICD-10-CM | POA: Diagnosis not present

## 2018-04-15 DIAGNOSIS — E1142 Type 2 diabetes mellitus with diabetic polyneuropathy: Secondary | ICD-10-CM | POA: Diagnosis not present

## 2018-04-15 DIAGNOSIS — K635 Polyp of colon: Secondary | ICD-10-CM

## 2018-04-15 DIAGNOSIS — E119 Type 2 diabetes mellitus without complications: Secondary | ICD-10-CM

## 2018-04-15 LAB — BAYER DCA HB A1C WAIVED: HB A1C (BAYER DCA - WAIVED): 6.7 %

## 2018-04-15 NOTE — Patient Instructions (Addendum)
Continue current medications. Continue good therapeutic lifestyle changes which include good diet and exercise. Fall precautions discussed with patient. If an FOBT was given today- please return it to our front desk. If you are over 52 years old - you may need Prevnar 75 or the adult Pneumonia vaccine.  **Flu shots are available--- please call and schedule a FLU-CLINIC appointment**  After your visit with [place] today you will receive a survey in the mail or online from [employer] regarding your care with [place]. Please take a moment to fill this out. Your feedback is very important to [place] as you can help [place] better understand your patient needs as well as improve your experience and satisfaction. WE CARE ABOUT YOU!!!   We will arrange for you to have an appointment with the dermatologist to do a general skin check and the process of doing that be sure than have them look at the lesion on your right forearm. Please make a concerted effort to walk and exercise more regularly drink more fluids and reduce the carbs and sugar in your diet  Please return the FOBT Avoid climbing and continue to take vitamin D and calcium

## 2018-04-15 NOTE — Progress Notes (Signed)
Subjective:    Patient ID: Holly Rodriguez, female    DOB: [birth year], 52 y.o.   MRN: [id number]  HPI Pt here for follow up and management of chronic medical problems which includes diabetes, hyperlipidemia and thyroid disease. She is taking medication regularly.  The patient today comes in for her regular 39-monthvisit and does complain with a knot on her right calf and a skin lesion on her right lower arm.  She will get a DEXA scan today will be given an FOBT to return and will get lab work.  The patient's weight is 257 and her BMI is 45.36.  In addition to the diabetes this patient has morbid obesity and is on thyroid replacement.  Her vital signs are stable today other than her weight being too high.  Her last hemoglobin A1c 7 months ago was 7.8%.  This patient has had a lot of stress on her with the sudden death of her mother and try to take care of an elderly grandmother.  She seems much relieved as the elderly grandmother is now in a nursing home and getting better taken care of and she is resolved any differences she has with family members to help reduce the property issues that were going on and have her take care of everything.  The patient shows this relaxation in her demeanor today.  She has this cystic not in her right calf which appears to be benign and she has a lesion in the right lower arm which could be an early basal cell skin cancer.  She is due to have a general skin check with Dr. LAllyson Sabaland we will go ahead and set her up for that evaluation and he can check this area if it is still present.  She is also due to get a repeat colonoscopy as she has been notified by the gastroenterologist and she plans to follow through on that.  She says that since the stress is off of her she is going to make a more concerted effort to work on her weight through exercise and diet.    Patient Active Problem List   Diagnosis Date Noted  . Recurrent herpes simplex 09/22/2017  . Type 2 diabetes  mellitus without complication, without long-term current use of insulin ([place] 09/22/2017  . Thyroid disease 09/13/2017  . Hypertriglyceridemia 03/22/2016  . Grief at loss of child 02/03/2016  . Morbid obesity ([facility] 11/15/2014  . Colon polyps 08/03/2014  . Hemorrhoids, external 08/03/2014  . Prolapsed internal hemorrhoids, grade 1 08/03/2014  . Iron deficiency 06/22/2014  . Type 2 diabetes mellitus not at goal ([facility] 12/06/2013  . Hypertension associated with diabetes ([facility] 12/06/2013  . Acute appendicitis 10/25/2013   Outpatient Encounter Medications as of 04/15/2018  Medication Sig  . allopurinol (ZYLOPRIM) 300 MG tablet TAKE 1 TABLET BY MOUTH  DAILY. FOR GOUT PREVENTION  . colchicine 0.6 MG tablet Take 1 tablet (0.6 mg total) by mouth 2 (two) times daily. Until symptoms clear.  . fenofibrate 160 MG tablet TAKE ONE (1) TABLET EACH DAY  . fluticasone (FLONASE) 50 MCG/ACT nasal spray Place 2 sprays into both nostrils daily.  .Marland Kitchenglucose blood test strip Reli-on glucometer.  Test Blood sugar qid Dx. 250.01  . Lancets (ONETOUCH ULTRASOFT) lancets Test 1X per day and prn   Dx 250.01  . levothyroxine (SYNTHROID, LEVOTHROID) 75 MCG tablet TAKE 1 TABLET BY MOUTH  DAILY BEFORE BREAKFAST  . lisinopril (PRINIVIL,ZESTRIL) 20 MG tablet TAKE 1 TABLET BY MOUTH  DAILY  . Multiple Vitamins-Minerals (ALIVE WOMENS ENERGY PO) Take 1 tablet by mouth daily. Reported on 03/22/2016  . Omega-3 Fatty Acids (OMEGA 3 PO) Take 4 capsules by mouth daily.  Marland Kitchen omeprazole (PRILOSEC) 40 MG capsule TAKE 1 CAPSULE BY MOUTH  DAILY  . potassium chloride (K-DUR) 10 MEQ tablet TAKE 1 TABLET BY MOUTH  DAILY  . SYNJARDY 12.03-999 MG TABS TAKE 1 TABLET BY MOUTH TWO  TIMES DAILY  . valACYclovir (VALTREX) 1000 MG tablet Take 1 tablet (1,000 mg total) daily by mouth. As directed  . Vitamin D, Ergocalciferol, (DRISDOL) 50000 units CAPS capsule TAKE 1 CAPSULE BY MOUTH  EVERY 7 DAYS  . [DISCONTINUED] benzonatate (TESSALON) 200 MG capsule  Take 1 capsule (200 mg total) by mouth 2 (two) times daily as needed for cough.  . [DISCONTINUED] dicyclomine (BENTYL) 20 MG tablet Take 0.5- 1  tab 4 times a day PRN before meals and at bedtime.  . [DISCONTINUED] Empagliflozin-Metformin HCl ER (SYNJARDY XR) 25-1000 MG TB24 Take 1 tablet by mouth daily.   No facility-administered encounter medications on file as of 04/15/2018.      Review of Systems  Constitutional: Negative.   HENT: Negative.   Eyes: Negative.   Respiratory: Negative.   Cardiovascular: Negative.   Gastrointestinal: Negative.   Endocrine: Negative.   Genitourinary: Negative.   Musculoskeletal: Negative.   Skin: Negative.        Right calf - knot Right lower arm - lesion   Allergic/Immunologic: Negative.   Neurological: Negative.   Hematological: Negative.   Psychiatric/Behavioral: Negative.        Objective:   Physical Exam  Constitutional: She is oriented to person, place, and time. She appears well-developed and well-nourished. No distress.  The patient is pleasant and much more relaxed appearing with a smile on her face today.  She has been stressed a lot over the past year but seems to be doing better with this.  HENT:  Head: Normocephalic and atraumatic.  Right Ear: External ear normal.  Left Ear: External ear normal.  Nose: Nose normal.  Mouth/Throat: Oropharynx is clear and moist. No oropharyngeal exudate.  Eyes: Pupils are equal, round, and reactive to light. Conjunctivae and EOM are normal. Right eye exhibits no discharge. Left eye exhibits no discharge. No scleral icterus.  She is up-to-date on her eye exams.  Neck: Normal range of motion. Neck supple. No thyromegaly present.  No thyromegaly anterior cervical adenopathy or bruits  Cardiovascular: Normal rate, regular rhythm, normal heart sounds and intact distal pulses.  No murmur heard. The heart is regular at 84/min  Pulmonary/Chest: Effort normal and breath sounds normal. She has no wheezes.  She has no rales.  Clear anteriorly and posteriorly with minimal congestion with coughing  Abdominal: Soft. Bowel sounds are normal. She exhibits no mass. There is no tenderness.  Abdominal obesity without masses tenderness or organ enlargement or bruits  Musculoskeletal: Normal range of motion. She exhibits no edema.  Lymphadenopathy:    She has no cervical adenopathy.  Neurological: She is alert and oriented to person, place, and time. She has normal reflexes. No cranial nerve deficit.  Diminished reflexes in both lower extremities  Skin: Skin is warm and dry.  There is a cystic type knot under the skin in the right lower leg.  There is also a small shiny type lesion that may be an early basal cell of the right forearm.  The patient is due to see the dermatologist for her skin check and  we will make sure that she points these out to the dermatologist at that visit.  Psychiatric: She has a normal mood and affect. Her behavior is normal. Judgment and thought content normal.  Nursing note and vitals reviewed.   BP 118/76 (BP Location: Left Arm)   Pulse 83   Temp (!) 97.4 F (36.3 C) (Oral)   Ht _0  (1.6 m)   Wt 257 lb (116.6 kg)   BMI 45.53 kg/m        Assessment & Plan:  1. Type 2 diabetes mellitus without complication, without long-term current use of insulin ([place]) -Continue with aggressive therapeutic lifestyle changes and current medication treatment pending results of lab work -The patient is encouraged to recommit herself to better management of her weight through diet and exercise and reducing her BMI - BMP8+EGFR - CBC with Differential/Platelet - Microalbumin / creatinine urine ratio - Bayer DCA Hb A1c Waived  2. Vitamin D deficiency -10 you current treatment pending results of lab work - CBC with Differential/Platelet - VITAMIN D 25 Hydroxy (Vit-D Deficiency, Fractures) - DG [facility] DEXA; Future  3. Hypertriglyceridemia -Discontinue the krill oral and change to  Vascepa 2 g twice daily if insurance will cover this - CBC with Differential/Platelet - Lipid panel - Hepatic function panel  4. Thyroid disease -Continue current treatment pending results of lab work - CBC with Differential/Platelet - Thyroid Panel With TSH  5. Iron deficiency - CBC with Differential/Platelet  6. Morbid obesity ([place]) -Patient has a positive added to that she plans to take time to take better care of herself to lose weight by getting more exercise and following her diet more carefully. - CBC with Differential/Platelet  7. Screening for osteoporosis - DG [facility] DEXA; Future  8. Postmenopausal - DG [facility] DEXA; Future  9.  Colon polyps -Follow-up with gastroenterologist as planned  10.  Diabetic polyneuropathy associated with type 2 diabetes mellitus -Continue with current treatment and aggressive therapeutic lifestyle changes pending results of lab work  Patient Instructions  Continue current medications. Continue good therapeutic lifestyle changes which include good diet and exercise. Fall precautions discussed with patient. If an FOBT was given today- please return it to our front desk. If you are over 4 years old - you may need Prevnar 56 or the adult Pneumonia vaccine.  **Flu shots are available--- please call and schedule a FLU-CLINIC appointment**  After your visit with [place] today you will receive a survey in the mail or online from [employer] regarding your care with [place]. Please take a moment to fill this out. Your feedback is very important to [place] as you can help [place] better understand your patient needs as well as improve your experience and satisfaction. WE CARE ABOUT YOU!!!   We will arrange for you to have an appointment with the dermatologist to do a general skin check and the process of doing that be sure than have them look at the lesion on your right forearm. Please make a concerted effort to walk and exercise more regularly drink more fluids and  reduce the carbs and sugar in your diet  Please return the FOBT Avoid climbing and continue to take vitamin D and calcium  Arrie Senate MD

## 2018-04-16 ENCOUNTER — Other Ambulatory Visit: Payer: Self-pay | Admitting: *Deleted

## 2018-04-16 ENCOUNTER — Other Ambulatory Visit: Payer: Self-pay | Admitting: Family Medicine

## 2018-04-16 ENCOUNTER — Encounter: Payer: Self-pay | Admitting: Family Medicine

## 2018-04-16 DIAGNOSIS — R710 Precipitous drop in hematocrit: Secondary | ICD-10-CM

## 2018-04-16 DIAGNOSIS — R7989 Other specified abnormal findings of blood chemistry: Secondary | ICD-10-CM

## 2018-04-16 LAB — LIPID PANEL
CHOLESTEROL TOTAL: 194 mg/dL (ref 100–199)
Chol/HDL Ratio: 5.1 ratio — ABNORMAL HIGH (ref 0.0–4.4)
HDL: 38 mg/dL — ABNORMAL LOW (ref >39)
LDL Calculated: 97 mg/dL (ref 0–99)
Triglycerides: 296 mg/dL — ABNORMAL HIGH (ref 0–149)
VLDL CHOLESTEROL CAL: 59 mg/dL — AB (ref 5–40)

## 2018-04-16 LAB — HEPATIC FUNCTION PANEL
ALBUMIN: 4.4 g/dL (ref 3.5–5.5)
ALK PHOS: 66 IU/L (ref 39–117)
ALT: 22 IU/L (ref 0–32)
AST: 23 IU/L (ref 0–40)
BILIRUBIN, DIRECT: 0.1 mg/dL (ref 0.00–0.40)
TOTAL PROTEIN: 7.3 g/dL (ref 6.0–8.5)

## 2018-04-16 LAB — BMP8+EGFR
BUN/Creatinine Ratio: 24 — ABNORMAL HIGH (ref 9–23)
BUN: 36 mg/dL — ABNORMAL HIGH (ref 6–24)
CALCIUM: 9.7 mg/dL (ref 8.7–10.2)
CHLORIDE: 101 mmol/L (ref 96–106)
CO2: 21 mmol/L (ref 20–29)
Creatinine, Ser: 1.5 mg/dL — ABNORMAL HIGH (ref 0.57–1.00)
GFR, EST AFRICAN AMERICAN: 46 mL/min/{1.73_m2} — AB (ref >59)
GFR, EST NON AFRICAN AMERICAN: 40 mL/min/{1.73_m2} — AB (ref >59)
Glucose: 128 mg/dL — ABNORMAL HIGH (ref 65–99)
POTASSIUM: 4.8 mmol/L (ref 3.5–5.2)
SODIUM: 137 mmol/L (ref 134–144)

## 2018-04-16 LAB — THYROID PANEL WITH TSH
FREE THYROXINE INDEX: 2.3 (ref 1.2–4.9)
T3 Uptake Ratio: 30 % (ref 24–39)
T4, Total: 7.8 ug/dL (ref 4.5–12.0)
TSH: 2.08 u[IU]/mL (ref 0.450–4.500)

## 2018-04-16 LAB — CBC WITH DIFFERENTIAL/PLATELET
BASOS ABS: 0.1 10*3/uL (ref 0.0–0.2)
BASOS: 1 %
EOS (ABSOLUTE): 0.2 10*3/uL (ref 0.0–0.4)
Eos: 2 %
Hematocrit: 35.2 % (ref 34.0–46.6)
Hemoglobin: 11.4 g/dL (ref 11.1–15.9)
IMMATURE GRANS (ABS): 0 10*3/uL (ref 0.0–0.1)
Immature Granulocytes: 0 %
LYMPHS: 42 %
Lymphocytes Absolute: 4.4 10*3/uL — ABNORMAL HIGH (ref 0.7–3.1)
MCH: 28.4 pg (ref 26.6–33.0)
MCHC: 32.4 g/dL (ref 31.5–35.7)
MCV: 88 fL (ref 79–97)
MONOS ABS: 0.6 10*3/uL (ref 0.1–0.9)
Monocytes: 6 %
NEUTROS ABS: 5.2 10*3/uL (ref 1.4–7.0)
Neutrophils: 49 %
PLATELETS: 371 10*3/uL (ref 150–450)
RBC: 4.01 x10E6/uL (ref 3.77–5.28)
RDW: 15.4 % (ref 12.3–15.4)
WBC: 10.4 10*3/uL (ref 3.4–10.8)

## 2018-04-16 LAB — MICROALBUMIN / CREATININE URINE RATIO
Creatinine, Urine: 94.3 mg/dL
Microalb/Creat Ratio: 36.2 mg/g creat — ABNORMAL HIGH (ref 0.0–30.0)
Microalbumin, Urine: 34.1 ug/mL

## 2018-04-16 LAB — VITAMIN D 25 HYDROXY (VIT D DEFICIENCY, FRACTURES): VIT D 25 HYDROXY: 30.4 ng/mL (ref 30.0–100.0)

## 2018-05-02 ENCOUNTER — Other Ambulatory Visit: Payer: Self-pay | Admitting: Family Medicine

## 2018-06-29 ENCOUNTER — Other Ambulatory Visit: Payer: Self-pay

## 2018-06-29 MED ORDER — VITAMIN D (ERGOCALCIFEROL) 1.25 MG (50000 UNIT) PO CAPS: ORAL_CAPSULE | ORAL | 0 refills | Status: DC

## 2018-06-29 NOTE — Telephone Encounter (Signed)
Last Vit D 04/15/18  30.4

## 2018-07-11 ENCOUNTER — Other Ambulatory Visit: Payer: Self-pay | Admitting: Family Medicine

## 2018-07-15 ENCOUNTER — Other Ambulatory Visit: Payer: Self-pay | Admitting: Nurse Practitioner

## 2018-08-12 ENCOUNTER — Encounter: Payer: Self-pay | Admitting: Family Medicine

## 2018-08-20 ENCOUNTER — Ambulatory Visit (INDEPENDENT_AMBULATORY_CARE_PROVIDER_SITE_OTHER): Payer: BLUE CROSS/BLUE SHIELD

## 2018-08-20 ENCOUNTER — Encounter: Payer: Self-pay | Admitting: Family Medicine

## 2018-08-20 ENCOUNTER — Ambulatory Visit: Payer: BLUE CROSS/BLUE SHIELD | Admitting: Family Medicine

## 2018-08-20 VITALS — BP 121/76 | HR 77 | Temp 97.8°F | Ht 63.0 in | Wt 257.0 lb

## 2018-08-20 DIAGNOSIS — R7989 Other specified abnormal findings of blood chemistry: Secondary | ICD-10-CM

## 2018-08-20 DIAGNOSIS — E079 Disorder of thyroid, unspecified: Secondary | ICD-10-CM | POA: Diagnosis not present

## 2018-08-20 DIAGNOSIS — E559 Vitamin D deficiency, unspecified: Secondary | ICD-10-CM

## 2018-08-20 DIAGNOSIS — E119 Type 2 diabetes mellitus without complications: Secondary | ICD-10-CM

## 2018-08-20 DIAGNOSIS — E781 Pure hyperglyceridemia: Secondary | ICD-10-CM

## 2018-08-20 DIAGNOSIS — E1142 Type 2 diabetes mellitus with diabetic polyneuropathy: Secondary | ICD-10-CM

## 2018-08-20 DIAGNOSIS — E611 Iron deficiency: Secondary | ICD-10-CM

## 2018-08-20 DIAGNOSIS — F411 Generalized anxiety disorder: Secondary | ICD-10-CM

## 2018-08-20 DIAGNOSIS — Z23 Encounter for immunization: Secondary | ICD-10-CM

## 2018-08-20 DIAGNOSIS — I10 Essential (primary) hypertension: Secondary | ICD-10-CM

## 2018-08-20 DIAGNOSIS — E1159 Type 2 diabetes mellitus with other circulatory complications: Secondary | ICD-10-CM

## 2018-08-20 DIAGNOSIS — I152 Hypertension secondary to endocrine disorders: Secondary | ICD-10-CM

## 2018-08-20 DIAGNOSIS — R710 Precipitous drop in hematocrit: Secondary | ICD-10-CM

## 2018-08-20 LAB — BAYER DCA HB A1C WAIVED: HB A1C: 7.6 % — AB (ref <7.0)

## 2018-08-20 MED ORDER — ALPRAZOLAM 0.5 MG PO TABS: 1.0000 mg | ORAL_TABLET | Freq: Every evening | ORAL | 1 refills | Status: DC | PRN

## 2018-08-20 NOTE — Patient Instructions (Addendum)
Continue current medications. Continue good therapeutic lifestyle changes which include good diet and exercise. Fall precautions discussed with patient. If an FOBT was given today- please return it to our front desk. If you are over 52 years old - you may need Prevnar 6 or the adult Pneumonia vaccine.  **Flu shots are available--- please call and schedule a FLU-CLINIC appointment**  After your visit with [place] today you will receive a survey in the mail or online from [employer] regarding your care with [place]. Please take a moment to fill this out. Your feedback is very important to [place] as you can help [place] better understand your patient needs as well as improve your experience and satisfaction. WE CARE ABOUT YOU!!!   It is very important to set the rules straight for your son and what is expected from him.  This will make you feel better and will make him understand his responsibilities Also it is important that you take your blood pressure medicine and blood sugar medicine as you are supposed to do and take it regularly. Keep trying to make all efforts to lose weight through diet and exercise and drink more water

## 2018-08-20 NOTE — Progress Notes (Signed)
Subjective:    Patient ID: Holly Rodriguez, female    DOB: Jan 16, 1966, 52 y.o.   MRN: [id number]  HPI Pt here for follow up and management of chronic medical problems which includes diabetes and hyperlipidemia. She is taking medication regularly.  The patient is still dealing with a lot of stress and anxiety.  Her weight is staying the same at 257 and her body mass index is still 45.54.  The blood pressure is good and she plans to get her flu shot today.  She will also need a chest x-ray will be given an FOBT and get lab work.  Patient is very stressed.  She just found out her son has gotten his girlfriend pregnant and the baby is due in January and the son does not have a job.  She is just recovering from losing her mother and getting through that stress and getting married to the man that she is been with for several years and now she is feeling like she has to take this at home.  She is also not very prudent about taking her medicines regularly and admits to not taking her Synjardy in the evening.  I did discuss with her the fact that blood sugars being elevated could add to her stress and that it is very important that she takes her medicine twice daily.  She still remains very overweight.  She denies any chest pain pressure or tightness.  She denies any major issues with her stomach other than irritable bowel syndrome problems with both diarrhea and constipation.  She has not seen any blood in the stool.  She does describe some right lower quadrant abdominal pain at times.  She did receive a note from her gastroenterologist, Dr. May God saying that she was due to get a colonoscopy but has not call to set this up.  I did encourage her to call and schedule this and find out the best time with her insurance to get this done.  She is passing her water well.     Patient Active Problem List   Diagnosis Date Noted  . Recurrent herpes simplex 09/22/2017  . Type 2 diabetes mellitus with diabetic  chronic kidney disease ([place]) 09/22/2017  . Thyroid disease 09/13/2017  . Hypertriglyceridemia 03/22/2016  . Grief at loss of child 02/03/2016  . Morbid obesity ([place]) 11/15/2014  . Colon polyps 08/03/2014  . Hemorrhoids, external 08/03/2014  . Prolapsed internal hemorrhoids, grade 1 08/03/2014  . Iron deficiency 06/22/2014  . Type 2 diabetes mellitus not at goal [facility]) 12/06/2013  . Hypertension associated with diabetes ([place]) 12/06/2013  . Acute appendicitis 10/25/2013   Outpatient Encounter Medications as of 08/20/2018  Medication Sig  . allopurinol (ZYLOPRIM) 300 MG tablet TAKE 1 TABLET BY MOUTH  DAILY. FOR GOUT PREVENTION  . colchicine 0.6 MG tablet Take 1 tablet (0.6 mg total) by mouth 2 (two) times daily. Until symptoms clear.  . fenofibrate 160 MG tablet TAKE 1 TABLET BY MOUTH  EVERY DAY  . fluticasone (FLONASE) 50 MCG/ACT nasal spray Place 2 sprays into both nostrils daily.  Marland Kitchen glucose blood test strip Reli-on glucometer.  Test Blood sugar qid Dx. 250.01  . Lancets (ONETOUCH ULTRASOFT) lancets Test 1X per day and prn   Dx 250.01  . levothyroxine (SYNTHROID, LEVOTHROID) 75 MCG tablet TAKE 1 TABLET BY MOUTH  DAILY BEFORE BREAKFAST  . lisinopril (PRINIVIL,ZESTRIL) 20 MG tablet TAKE 1 TABLET BY MOUTH  DAILY  . Multiple Vitamins-Minerals (ALIVE WOMENS ENERGY PO)  Take 1 tablet by mouth daily. Reported on 03/22/2016  . Omega-3 Fatty Acids (OMEGA 3 PO) Take 4 capsules by mouth daily.  Marland Kitchen omeprazole (PRILOSEC) 40 MG capsule TAKE 1 CAPSULE BY MOUTH  DAILY  . potassium chloride (K-DUR) 10 MEQ tablet TAKE 1 TABLET BY MOUTH  DAILY  . SYNJARDY 12.03-999 MG TABS TAKE 1 TABLET BY MOUTH TWO  TIMES DAILY  . valACYclovir (VALTREX) 1000 MG tablet Take 1 tablet (1,000 mg total) daily by mouth. As directed  . Vitamin D, Ergocalciferol, (DRISDOL) 50000 units CAPS capsule TAKE 1 CAPSULE BY MOUTH  EVERY 7 DAYS   No facility-administered encounter medications on file as of 08/20/2018.      Review of Systems    Constitutional: Positive for fatigue (doing some exrta work).  HENT: Negative.   Eyes: Negative.   Respiratory: Negative.   Cardiovascular: Negative.   Gastrointestinal: Negative.   Endocrine: Negative.   Genitourinary: Negative.   Musculoskeletal: Negative.   Skin: Negative.   Allergic/Immunologic: Negative.   Neurological: Negative.   Hematological: Negative.   Psychiatric/Behavioral: The patient is nervous/anxious (anxiety / stress with family).        Objective:   Physical Exam  Constitutional: She is oriented to person, place, and time. She appears well-developed and well-nourished. She appears distressed.  Patient is pleasant but stressed  HENT:  Head: Normocephalic and atraumatic.  Right Ear: External ear normal.  Left Ear: External ear normal.  Nose: Nose normal.  Mouth/Throat: Oropharynx is clear and moist.  Eyes: Pupils are equal, round, and reactive to light. Conjunctivae and EOM are normal. Right eye exhibits no discharge. Left eye exhibits no discharge. No scleral icterus.   is in need of eye exam  Neck: Normal range of motion. Neck supple. No thyromegaly present.  No bruits thyromegaly or anterior cervical adenopathy  Cardiovascular: Normal rate, regular rhythm, normal heart sounds and intact distal pulses.  No murmur heard. Heart is regular at 84/min with good pedal pulses and no edema  Pulmonary/Chest: Effort normal and breath sounds normal. She has no wheezes. She has no rales.  Clear anteriorly and posteriorly  Abdominal: Soft. Bowel sounds are normal. She exhibits no mass. There is no tenderness. There is no rebound.  Abdominal obesity without masses tenderness organ enlargement or bruits  Musculoskeletal: Normal range of motion. She exhibits no edema.  Lymphadenopathy:    She has no cervical adenopathy.  Neurological: She is alert and oriented to person, place, and time. She has normal reflexes. No cranial nerve deficit.  Lower extremity reflexes were  equal bilaterally at 2+  Skin: Skin is warm and dry. No rash noted.  Psychiatric: She has a normal mood and affect. Her behavior is normal. Judgment and thought content normal.  The patient's mood affect and behavior were all normal though she did have a sense of anxiety because of the stresses she is not sure that she can handle with the son and possible new grandchild.  Nursing note and vitals reviewed.   BP 121/76 (BP Location: Left Arm)   Pulse 77   Temp 97.8 F (36.6 C) (Oral)   Ht 5' 3"  (1.6 m)   Wt 257 lb (116.6 kg)   BMI 45.53 kg/m        Assessment & Plan:  1. Type 2 diabetes mellitus without complication, without long-term current use of insulin (HCC) -Blood sugars at home of been running in the 1 40-1 70 range fasting.  The patient understands that she needs to  try to do better and take her medicine regularly twice a day. - BMP8+EGFR - CBC with Differential/Platelet - Bayer DCA Hb A1c Waived - DG Chest 2 View; Future  2. Vitamin D deficiency -Continue vitamin D replacement pending results of lab work - CBC with Differential/Platelet - VITAMIN D 25 Hydroxy (Vit-D Deficiency, Fractures)  3. Thyroid disease -Continue with thyroid replacement pending results of lab work - CBC with Differential/Platelet  4. Hypertriglyceridemia -Continue with good blood sugar control fenofibrate and low-cholesterol diet pending results of lab work - CBC with Differential/Platelet - Lipid panel - Hepatic function panel - DG Chest 2 View; Future  5. Iron deficiency - CBC with Differential/Platelet  6. Anxiety state - ALPRAZolam (XANAX) 0.5 MG tablet; Take 2 tablets (1 mg total) by mouth at bedtime as needed for anxiety.  Dispense: 90 tablet; Refill: 1  7. Morbid obesity ([place]) -Continue to work aggressively with therapeutic lifestyle changes to achieve weight loss through diet and exercise  8. Elevated serum creatinine -Avoid NSAIDs keep blood pressure under good control and  keep sugar under good control  9. Decreased hemoglobin -Continue with omeprazole  10. Diabetic polyneuropathy associated with type 2 diabetes mellitus ([place]) -Continue with good blood sugar control and take medicines regularly  11. Hypertension associated with diabetes ([place]) -Blood pressure is good today and she will continue with current treatment  Meds ordered this encounter  Medications  . ALPRAZolam (XANAX) 0.5 MG tablet    Sig: Take 2 tablets (1 mg total) by mouth at bedtime as needed for anxiety.    Dispense:  90 tablet    Refill:  1   Patient Instructions  Continue current medications. Continue good therapeutic lifestyle changes which include good diet and exercise. Fall precautions discussed with patient. If an FOBT was given today- please return it to our front desk. If you are over 49 years old - you may need Prevnar 68 or the adult Pneumonia vaccine.  **Flu shots are available--- please call and schedule a FLU-CLINIC appointment**  After your visit with [place] today you will receive a survey in the mail or online from [employer] regarding your care with [place]. Please take a moment to fill this out. Your feedback is very important to [place] as you can help [place] better understand your patient needs as well as improve your experience and satisfaction. WE CARE ABOUT YOU!!!   It is very important to set the rules straight for your son and what is expected from him.  This will make you feel better and will make him understand his responsibilities Also it is important that you take your blood pressure medicine and blood sugar medicine as you are supposed to do and take it regularly. Keep trying to make all efforts to lose weight through diet and exercise and drink more water  Arrie Senate MD

## 2018-08-21 LAB — BMP8+EGFR
BUN/Creatinine Ratio: 24 — ABNORMAL HIGH (ref 9–23)
BUN: 35 mg/dL — AB (ref 6–24)
CO2: 19 mmol/L — ABNORMAL LOW (ref 20–29)
Calcium: 9.4 mg/dL (ref 8.7–10.2)
Chloride: 101 mmol/L (ref 96–106)
Creatinine, Ser: 1.45 mg/dL — ABNORMAL HIGH (ref 0.57–1.00)
GFR, EST AFRICAN AMERICAN: 48 mL/min/{1.73_m2} — AB (ref >59)
GFR, EST NON AFRICAN AMERICAN: 41 mL/min/{1.73_m2} — AB (ref >59)
Glucose: 134 mg/dL — ABNORMAL HIGH (ref 65–99)
Potassium: 4.9 mmol/L (ref 3.5–5.2)
Sodium: 139 mmol/L (ref 134–144)

## 2018-08-21 LAB — CBC WITH DIFFERENTIAL/PLATELET
Basophils Absolute: 0.1 10*3/uL (ref 0.0–0.2)
Basos: 1 %
EOS (ABSOLUTE): 0.2 10*3/uL (ref 0.0–0.4)
EOS: 2 %
HEMATOCRIT: 33.9 % — AB (ref 34.0–46.6)
HEMOGLOBIN: 11.7 g/dL (ref 11.1–15.9)
IMMATURE GRANS (ABS): 0.1 10*3/uL (ref 0.0–0.1)
Immature Granulocytes: 1 %
Lymphocytes Absolute: 4.1 10*3/uL — ABNORMAL HIGH (ref 0.7–3.1)
Lymphs: 40 %
MCH: 28.6 pg (ref 26.6–33.0)
MCHC: 34.5 g/dL (ref 31.5–35.7)
MCV: 83 fL (ref 79–97)
Monocytes Absolute: 0.5 10*3/uL (ref 0.1–0.9)
Monocytes: 5 %
NEUTROS ABS: 5.2 10*3/uL (ref 1.4–7.0)
Neutrophils: 51 %
Platelets: 333 10*3/uL (ref 150–450)
RBC: 4.09 x10E6/uL (ref 3.77–5.28)
RDW: 14.7 % (ref 12.3–15.4)
WBC: 10.1 10*3/uL (ref 3.4–10.8)

## 2018-08-21 LAB — LIPID PANEL
CHOLESTEROL TOTAL: 198 mg/dL (ref 100–199)
Chol/HDL Ratio: 5.5 ratio — ABNORMAL HIGH (ref 0.0–4.4)
HDL: 36 mg/dL — ABNORMAL LOW (ref >39)
LDL Calculated: 90 mg/dL (ref 0–99)
TRIGLYCERIDES: 359 mg/dL — AB (ref 0–149)
VLDL Cholesterol Cal: 72 mg/dL — ABNORMAL HIGH (ref 5–40)

## 2018-08-21 LAB — HEPATIC FUNCTION PANEL
ALT: 26 IU/L (ref 0–32)
AST: 32 IU/L (ref 0–40)
Albumin: 4.4 g/dL (ref 3.5–5.5)
Alkaline Phosphatase: 69 IU/L (ref 39–117)
Bilirubin, Direct: 0.08 mg/dL (ref 0.00–0.40)
TOTAL PROTEIN: 7.3 g/dL (ref 6.0–8.5)

## 2018-08-21 LAB — VITAMIN D 25 HYDROXY (VIT D DEFICIENCY, FRACTURES): Vit D, 25-Hydroxy: 28.7 ng/mL — ABNORMAL LOW (ref 30.0–100.0)

## 2018-09-29 DIAGNOSIS — H2513 Age-related nuclear cataract, bilateral: Secondary | ICD-10-CM | POA: Diagnosis not present

## 2018-09-29 DIAGNOSIS — H01021 Squamous blepharitis right upper eyelid: Secondary | ICD-10-CM | POA: Diagnosis not present

## 2018-09-29 DIAGNOSIS — H04123 Dry eye syndrome of bilateral lacrimal glands: Secondary | ICD-10-CM | POA: Diagnosis not present

## 2018-09-29 DIAGNOSIS — E119 Type 2 diabetes mellitus without complications: Secondary | ICD-10-CM | POA: Diagnosis not present

## 2018-09-29 LAB — HM DIABETES EYE EXAM

## 2018-10-19 ENCOUNTER — Ambulatory Visit: Payer: BLUE CROSS/BLUE SHIELD | Admitting: Nurse Practitioner

## 2018-10-19 ENCOUNTER — Ambulatory Visit: Payer: BLUE CROSS/BLUE SHIELD | Admitting: Family Medicine

## 2018-10-19 ENCOUNTER — Encounter: Payer: Self-pay | Admitting: Family Medicine

## 2018-10-19 VITALS — BP 135/76 | HR 87 | Temp 97.3°F | Ht 63.0 in | Wt 253.6 lb

## 2018-10-19 DIAGNOSIS — J40 Bronchitis, not specified as acute or chronic: Secondary | ICD-10-CM

## 2018-10-19 MED ORDER — FLUTICASONE PROPIONATE 50 MCG/ACT NA SUSP: 2.0000 | Freq: Every day | NASAL | 6 refills | Status: DC

## 2018-10-19 MED ORDER — METHYLPREDNISOLONE ACETATE 80 MG/ML IJ SUSP
80.0000 mg | Freq: Once | INTRAMUSCULAR | Status: AC
  Administered 2018-10-19: 80 mg via INTRAMUSCULAR

## 2018-10-19 MED ORDER — AMOXICILLIN-POT CLAVULANATE 875-125 MG PO TABS: 1.0000 | ORAL_TABLET | Freq: Two times a day (BID) | ORAL | 0 refills | Status: DC

## 2018-10-19 NOTE — Progress Notes (Signed)
BP 135/76   Pulse 87   Temp (!) 97.3 F (36.3 C) (Oral)   Ht 5\' 3"  (1.6 m)   Wt 253 lb 9.6 oz (115 kg)   SpO2 97%   BMI 44.92 kg/m    Subjective:    Patient ID: Holly Rodriguez, female    DOB: November 01, 1966, 52 y.o.   MRN: [id number]  HPI: Holly Rodriguez is a 52 y.o. female presenting on 10/19/2018 for Cough (x 1 week - otc mucinex); Nasal Congestion; and Headache   HPI Cough and congestion and headache Patient comes in complaining of cough and congestion and headache is been going on for the past week.  She feels like is getting worse and started go down into her chest.  She has been using Mucinex which has helped some but not completely.  She feels like things are getting worse.  She denies any fevers or chills but has had sweats and she does feel like she is had some wheezing occasionally.  Relevant past medical, surgical, family and social history reviewed and updated as indicated. Interim medical history since our last visit reviewed. Allergies and medications reviewed and updated.  Review of Systems  Constitutional: Negative for chills and fever.  HENT: Positive for congestion, postnasal drip, rhinorrhea, sinus pressure, sneezing and sore throat. Negative for ear discharge and ear pain.   Eyes: Negative for pain, redness and visual disturbance.  Respiratory: Positive for cough and wheezing. Negative for chest tightness and shortness of breath.   Cardiovascular: Negative for chest pain and leg swelling.  Musculoskeletal: Negative for back pain and gait problem.  Skin: Negative for rash.  Neurological: Negative for light-headedness and headaches.  Psychiatric/Behavioral: Negative for agitation and behavioral problems.  All other systems reviewed and are negative.   Per HPI unless specifically indicated above   Allergies as of 10/19/2018   No Known Allergies     Medication List        Accurate as of 10/19/18 12:02 PM. Always use your most recent med list.            ALIVE WOMENS ENERGY PO Take 1 tablet by mouth daily. Reported on 03/22/2016   allopurinol 300 MG tablet Commonly known as:  ZYLOPRIM TAKE 1 TABLET BY MOUTH  DAILY. FOR GOUT PREVENTION   ALPRAZolam 0.5 MG tablet Commonly known as:  XANAX Take 2 tablets (1 mg total) by mouth at bedtime as needed for anxiety.   amoxicillin-clavulanate 875-125 MG tablet Commonly known as:  AUGMENTIN Take 1 tablet by mouth 2 (two) times daily.   colchicine 0.6 MG tablet Take 1 tablet (0.6 mg total) by mouth 2 (two) times daily. Until symptoms clear.   fenofibrate 160 MG tablet TAKE 1 TABLET BY MOUTH  EVERY DAY   fluticasone 50 MCG/ACT nasal spray Commonly known as:  FLONASE Place 2 sprays into both nostrils daily.   glucose blood test strip Reli-on glucometer.  Test Blood sugar qid Dx. 250.01   levothyroxine 75 MCG tablet Commonly known as:  SYNTHROID, LEVOTHROID TAKE 1 TABLET BY MOUTH  DAILY BEFORE BREAKFAST   lisinopril 20 MG tablet Commonly known as:  PRINIVIL,ZESTRIL TAKE 1 TABLET BY MOUTH  DAILY   OMEGA 3 PO Take 4 capsules by mouth daily.   omeprazole 40 MG capsule Commonly known as:  PRILOSEC TAKE 1 CAPSULE BY MOUTH  DAILY   onetouch ultrasoft lancets Test 1X per day and prn   Dx 250.01   potassium chloride 10 MEQ tablet Commonly  known as:  K-DUR TAKE 1 TABLET BY MOUTH  DAILY   SYNJARDY 12.03-999 MG Tabs Generic drug:  Empagliflozin-metFORMIN HCl TAKE 1 TABLET BY MOUTH TWO  TIMES DAILY   valACYclovir 1000 MG tablet Commonly known as:  VALTREX Take 1 tablet (1,000 mg total) daily by mouth. As directed   Vitamin D (Ergocalciferol) 1.25 MG (50000 UT) Caps capsule Commonly known as:  DRISDOL TAKE 1 CAPSULE BY MOUTH  EVERY 7 DAYS          Objective:    BP 135/76   Pulse 87   Temp (!) 97.3 F (36.3 C) (Oral)   Ht 5\' 3"  (1.6 m)   Wt 253 lb 9.6 oz (115 kg)   SpO2 97%   BMI 44.92 kg/m   Wt Readings from Last 3 Encounters:  10/19/18 253 lb 9.6 oz (115 kg)   08/20/18 257 lb (116.6 kg)  04/15/18 257 lb (116.6 kg)    Physical Exam  Constitutional: She is oriented to person, place, and time. She appears well-developed and well-nourished. No distress.  HENT:  Right Ear: Tympanic membrane, external ear and ear canal normal.  Left Ear: Tympanic membrane, external ear and ear canal normal.  Nose: Mucosal edema and rhinorrhea present. No epistaxis. Right sinus exhibits no maxillary sinus tenderness and no frontal sinus tenderness. Left sinus exhibits no maxillary sinus tenderness and no frontal sinus tenderness.  Mouth/Throat: Uvula is midline and mucous membranes are normal. Posterior oropharyngeal edema present. No oropharyngeal exudate, posterior oropharyngeal erythema or tonsillar abscesses.  Eyes: Conjunctivae and EOM are normal.  Cardiovascular: Normal rate, regular rhythm, normal heart sounds and intact distal pulses.  No murmur heard. Pulmonary/Chest: Effort normal. No respiratory distress. She has no wheezes. She has rhonchi in the right upper field, the right middle field, the left upper field and the left middle field.  Musculoskeletal: Normal range of motion. She exhibits no edema or tenderness.  Neurological: She is alert and oriented to person, place, and time. Coordination normal.  Skin: Skin is warm and dry. No rash noted. She is not diaphoretic.  Psychiatric: She has a normal mood and affect. Her behavior is normal.  Vitals reviewed.       Assessment & Plan:   Problem List Items Addressed This Visit    None    Visit Diagnoses    Bronchitis    -  Primary   Relevant Medications   amoxicillin-clavulanate (AUGMENTIN) 875-125 MG tablet   fluticasone (FLONASE) 50 MCG/ACT nasal spray   methylPREDNISolone acetate (DEPO-MEDROL) injection 80 mg (Start on 10/19/2018 12:15 PM)       Follow up plan: Return if symptoms worsen or fail to improve.  Counseling provided for all of the vaccine components No orders of the defined types  were placed in this encounter.   Caryl Pina, MD [facility] 10/19/2018, 12:02 PM

## 2018-10-23 DIAGNOSIS — D649 Anemia, unspecified: Secondary | ICD-10-CM | POA: Diagnosis not present

## 2018-10-23 DIAGNOSIS — Z86010 Personal history of colonic polyps: Secondary | ICD-10-CM | POA: Diagnosis not present

## 2018-10-23 DIAGNOSIS — K625 Hemorrhage of anus and rectum: Secondary | ICD-10-CM | POA: Diagnosis not present

## 2018-10-30 ENCOUNTER — Other Ambulatory Visit: Payer: Self-pay | Admitting: Family Medicine

## 2018-11-23 ENCOUNTER — Encounter: Payer: Self-pay | Admitting: Family Medicine

## 2018-11-23 DIAGNOSIS — R195 Other fecal abnormalities: Secondary | ICD-10-CM | POA: Diagnosis not present

## 2018-11-23 DIAGNOSIS — K449 Diaphragmatic hernia without obstruction or gangrene: Secondary | ICD-10-CM | POA: Diagnosis not present

## 2018-11-23 DIAGNOSIS — Z86010 Personal history of colonic polyps: Secondary | ICD-10-CM | POA: Diagnosis not present

## 2018-11-23 DIAGNOSIS — R1013 Epigastric pain: Secondary | ICD-10-CM | POA: Diagnosis not present

## 2018-11-23 DIAGNOSIS — Z9884 Bariatric surgery status: Secondary | ICD-10-CM | POA: Diagnosis not present

## 2018-11-23 DIAGNOSIS — D123 Benign neoplasm of transverse colon: Secondary | ICD-10-CM | POA: Diagnosis not present

## 2018-11-23 HISTORY — PX: COLONOSCOPY: SHX174

## 2018-11-25 DIAGNOSIS — D123 Benign neoplasm of transverse colon: Secondary | ICD-10-CM | POA: Diagnosis not present

## 2018-11-27 ENCOUNTER — Ambulatory Visit: Payer: BLUE CROSS/BLUE SHIELD | Admitting: Pulmonary Disease

## 2018-11-27 ENCOUNTER — Encounter: Payer: Self-pay | Admitting: Pulmonary Disease

## 2018-11-27 VITALS — BP 116/84 | HR 85 | Ht 63.0 in | Wt 259.0 lb

## 2018-11-27 DIAGNOSIS — E669 Obesity, unspecified: Secondary | ICD-10-CM | POA: Diagnosis not present

## 2018-11-27 DIAGNOSIS — G4730 Sleep apnea, unspecified: Secondary | ICD-10-CM

## 2018-11-27 DIAGNOSIS — G4733 Obstructive sleep apnea (adult) (pediatric): Secondary | ICD-10-CM

## 2018-11-27 NOTE — Patient Instructions (Signed)
Will arrange for new medical supply company Will arrange for new SD card and get copy of CPAP report  Follow up in 6 months

## 2018-11-27 NOTE — Progress Notes (Signed)
[facility]  Chief Complaint  Patient presents with  . Follow-up    Pt is doing well on cpap machine. Pt needs a new DME, and pt needs new cpap script per [employer].    Constitutional:  BP 116/84 (BP Location: Left Arm, Cuff Size: Large)   Pulse 85   Ht 5\' 3"  (1.6 m)   Wt 259 lb (117.5 kg)   SpO2 97%   BMI 45.88 kg/m   Past Medical History:  Hypothyroidism, Gout, HLD, HTN, DM, GERD, IBS, Anxiety  Brief Summary:  Holly Rodriguez is a 53 y.o. female with obstructive sleep apnea.  She was last seen in 2017.    She has been using CPAP.  Needs new supplies.  Used [employer].  No issues with set up otherwise.  Denies sinus congestion, sore throat, dry mouth or aerophagia.   Physical Exam:   Appearance - well kempt   ENMT - clear nasal mucosa, midline nasal  septum, no oral exudates, no LAN, trachea midline  Respiratory - normal chest wall, normal respiratory effort, no accessory muscle use, no wheeze/rales  CV - s1s2 regular rate and rhythm, no murmurs, no peripheral edema, radial pulses symmetric  GI - soft, non tender, no masses  Lymph - no adenopathy noted in neck and axillary areas  MSK - normal gait  Ext - no cyanosis, clubbing, or joint inflammation noted  Skin - no rashes, lesions, or ulcers  Neuro - normal strength, oriented x 3  Psych - normal mood and affect   Assessment/Plan:   Obstructive sleep apnea. - she reports compliance with CPAP and benefit from therapy - will get her a new SD card and get copy of her download - will arrange for new mask and supplies  Obesity. - discussed importance of weight loss   Patient Instructions  Will arrange for new medical supply company Will arrange for new SD card and get copy of CPAP report  Follow up in 6 months   Chesley Mires, MD [place] Pager: [phone] 11/27/2018, 4:28 PM  Flow Sheet    Sleep tests:  PSG  01/04/09 >> AHI 16.6, SpO2 low 88%  Medications:   Allergies as of 11/27/2018   No Known Allergies     Medication List       Accurate as of November 27, 2018  4:28 PM. Always use your most recent med list.        ALIVE WOMENS ENERGY PO Take 1 tablet by mouth daily. Reported on 03/22/2016   allopurinol 300 MG tablet Commonly known as:  ZYLOPRIM TAKE 1 TABLET BY MOUTH  DAILY FOR GOUT PREVENTION   ALPRAZolam 0.5 MG tablet Commonly known as:  XANAX Take 2 tablets (1 mg total) by mouth at bedtime as needed for anxiety.   amoxicillin-clavulanate 875-125 MG tablet Commonly known as:  AUGMENTIN Take 1 tablet by mouth 2 (two) times daily.   colchicine 0.6 MG tablet Take 1 tablet (0.6 mg total) by mouth 2 (two) times daily. Until symptoms clear.   fenofibrate 160 MG tablet TAKE 1 TABLET BY MOUTH  EVERY DAY   fluticasone 50 MCG/ACT nasal spray Commonly known as:  FLONASE Place 2 sprays into both nostrils daily.   glucose blood test strip Reli-on glucometer.  Test Blood sugar qid Dx. 250.01   levothyroxine 75 MCG tablet Commonly known as:  SYNTHROID, LEVOTHROID TAKE 1 TABLET BY MOUTH  DAILY BEFORE BREAKFAST   lisinopril 20 MG tablet  Commonly known as:  PRINIVIL,ZESTRIL TAKE 1 TABLET BY MOUTH  DAILY   OMEGA 3 PO Take 4 capsules by mouth daily.   omeprazole 40 MG capsule Commonly known as:  PRILOSEC TAKE 1 CAPSULE BY MOUTH  DAILY   onetouch ultrasoft lancets Test 1X per day and prn   Dx 250.01   potassium chloride 10 MEQ tablet Commonly known as:  K-DUR TAKE 1 TABLET BY MOUTH  DAILY   SYNJARDY 12.03-999 MG Tabs Generic drug:  Empagliflozin-metFORMIN HCl TAKE 1 TABLET BY MOUTH TWO  TIMES DAILY   valACYclovir 1000 MG tablet Commonly known as:  VALTREX Take 1 tablet (1,000 mg total) daily by mouth. As directed   Vitamin D (Ergocalciferol) 1.25 MG (50000 UT) Caps capsule Commonly known as:  DRISDOL TAKE 1 CAPSULE BY MOUTH  EVERY 7 DAYS       Past Surgical  History:  She  has a past surgical history that includes Cesarean section; Tonsillectomy; Gastric restriction surgery; Tubal ligation; Cholecystectomy; and laparoscopic appendectomy (N/A, 10/25/2013).  Family History:  Her family history includes Alzheimer's disease in her maternal grandfather; Arthritis in her maternal grandmother and mother; Fibromyalgia in her mother; Gout in her daughter and son; Heart disease in her maternal grandmother; Hyperlipidemia in her mother.  Social History:  She  reports that she has never smoked. She has never used smokeless tobacco. She reports current alcohol use. She reports that she does not use drugs.

## 2018-12-03 ENCOUNTER — Other Ambulatory Visit: Payer: Self-pay | Admitting: Family Medicine

## 2018-12-21 ENCOUNTER — Encounter: Payer: Self-pay | Admitting: Nurse Practitioner

## 2018-12-21 ENCOUNTER — Ambulatory Visit: Payer: BLUE CROSS/BLUE SHIELD | Admitting: Nurse Practitioner

## 2018-12-21 VITALS — BP 114/72 | HR 73 | Temp 97.0°F | Ht 63.0 in | Wt 259.0 lb

## 2018-12-21 DIAGNOSIS — E1165 Type 2 diabetes mellitus with hyperglycemia: Secondary | ICD-10-CM

## 2018-12-21 MED ORDER — SITAGLIPTIN PHOSPHATE 100 MG PO TABS: 100.0000 mg | ORAL_TABLET | Freq: Every day | ORAL | 1 refills | Status: DC

## 2018-12-21 NOTE — Progress Notes (Signed)
Subjective:    Patient ID: Holly Rodriguez, female    DOB: 1966/01/05, 53 y.o.   MRN: [id number]   Chief Complaint: Discuss blood sugars (Has regular follow up appt with [facility] on Friday)   HPI Patient come sin today concerned about her blood sugar over the weekend. Her last HGBA1c was 7.6 on 08/20/18. Over the weekend her blood sugar was over 400 and then was as high as 272 sat and 353 on Sunday. She says she has been getting up to go the  rest room more at night then usual. She is currently on synjardy 12.03/999 bid. She does say that she has not been watching her diet very closely.   Review of Systems  Constitutional: Negative.  Negative for activity change and appetite change.  HENT: Negative.   Eyes: Negative for pain.  Respiratory: Negative for shortness of breath.   Cardiovascular: Negative for chest pain, palpitations and leg swelling.  Gastrointestinal: Negative for abdominal pain.  Endocrine: Positive for polyuria. Negative for polydipsia.  Genitourinary: Positive for frequency and urgency. Negative for dysuria.  Skin: Negative for rash.  Neurological: Negative for dizziness, weakness and headaches.  Hematological: Does not bruise/bleed easily.  Psychiatric/Behavioral: Negative.   All other systems reviewed and are negative.      Objective:   Physical Exam Constitutional:      Appearance: She is obese.  Cardiovascular:     Rate and Rhythm: Normal rate and regular rhythm.     Heart sounds: Normal heart sounds.  Skin:    General: Skin is warm and dry.  Neurological:     General: No focal deficit present.     Mental Status: She is alert and oriented to person, place, and time.  Psychiatric:        Mood and Affect: Mood normal.        Behavior: Behavior normal.    BP 114/72   Pulse 73   Temp (!) 97 F (36.1 C) (Oral)   Ht 5' 3" (1.6 m)   Wt 259 lb (117.5 kg)   BMI 45.88 kg/m   Urine 1+ glucose hgba1c 7.9    Assessment & Plan:  Holly Rodriguez in  today with chief complaint of Discuss blood sugars (Has regular follow up appt with [facility] on Friday)   1. Type 2 diabetes mellitus with hyperglycemia, without long-term current use of insulin (HCC) Strict carb counting'keep diary of blood sugars Added januvia 100  - CMP14+EGFR - Bayer DCA Hb A1c Waived - Urinalysis, Complete Follow up with dr. Laurance Flatten in 80monthMeds ordered this encounter  Medications  . sitaGLIPtin (JANUVIA) 100 MG tablet    Sig: Take 1 tablet (100 mg total) by mouth daily.    Dispense:  90 tablet    Refill:  1    Order Specific Question:   Supervising Provider    Answer:   DWorthy Rancher[[[id number]  [place] FNP

## 2018-12-21 NOTE — Patient Instructions (Signed)
Diabetes Mellitus and Nutrition, Adult  When you have diabetes (diabetes mellitus), it is very important to have healthy eating habits because your blood sugar (glucose) levels are greatly affected by what you eat and drink. Eating healthy foods in the appropriate amounts, at about the same times every day, can help you:  · Control your blood glucose.  · Lower your risk of heart disease.  · Improve your blood pressure.  · Reach or maintain a healthy weight.  Every person with diabetes is different, and each person has different needs for a meal plan. Your health care provider may recommend that you work with a diet and nutrition specialist (dietitian) to make a meal plan that is best for you. Your meal plan may vary depending on factors such as:  · The calories you need.  · The medicines you take.  · Your weight.  · Your blood glucose, blood pressure, and cholesterol levels.  · Your activity level.  · Other health conditions you have, such as heart or kidney disease.  How do carbohydrates affect me?  Carbohydrates, also called carbs, affect your blood glucose level more than any other type of food. Eating carbs naturally raises the amount of glucose in your blood. Carb counting is a method for keeping track of how many carbs you eat. Counting carbs is important to keep your blood glucose at a healthy level, especially if you use insulin or take certain oral diabetes medicines.  It is important to know how many carbs you can safely have in each meal. This is different for every person. Your dietitian can help you calculate how many carbs you should have at each meal and for each snack.  Foods that contain carbs include:  · Bread, cereal, rice, pasta, and crackers.  · Potatoes and corn.  · Peas, beans, and lentils.  · Milk and yogurt.  · Fruit and juice.  · Desserts, such as cakes, cookies, ice cream, and candy.  How does alcohol affect me?  Alcohol can cause a sudden decrease in blood glucose (hypoglycemia),  especially if you use insulin or take certain oral diabetes medicines. Hypoglycemia can be a life-threatening condition. Symptoms of hypoglycemia (sleepiness, dizziness, and confusion) are similar to symptoms of having too much alcohol.  If your health care provider says that alcohol is safe for you, follow these guidelines:  · Limit alcohol intake to no more than 1 drink per day for nonpregnant women and 2 drinks per day for men. One drink equals 12 oz of beer, 5 oz of wine, or 1½ oz of hard liquor.  · Do not drink on an empty stomach.  · Keep yourself hydrated with water, diet soda, or unsweetened iced tea.  · Keep in mind that regular soda, juice, and other mixers may contain a lot of sugar and must be counted as carbs.  What are tips for following this plan?    Reading food labels  · Start by checking the serving size on the "Nutrition Facts" label of packaged foods and drinks. The amount of calories, carbs, fats, and other nutrients listed on the label is based on one serving of the item. Many items contain more than one serving per package.  · Check the total grams (g) of carbs in one serving. You can calculate the number of servings of carbs in one serving by dividing the total carbs by 15. For example, if a food has 30 g of total carbs, it would be equal to 2   servings of carbs.  · Check the number of grams (g) of saturated and trans fats in one serving. Choose foods that have low or no amount of these fats.  · Check the number of milligrams (mg) of salt (sodium) in one serving. Most people should limit total sodium intake to less than 2,300 mg per day.  · Always check the nutrition information of foods labeled as "low-fat" or "nonfat". These foods may be higher in added sugar or refined carbs and should be avoided.  · Talk to your dietitian to identify your daily goals for nutrients listed on the label.  Shopping  · Avoid buying canned, premade, or processed foods. These foods tend to be high in fat, sodium,  and added sugar.  · Shop around the outside edge of the grocery store. This includes fresh fruits and vegetables, bulk grains, fresh meats, and fresh dairy.  Cooking  · Use low-heat cooking methods, such as baking, instead of high-heat cooking methods like deep frying.  · Cook using healthy oils, such as olive, canola, or sunflower oil.  · Avoid cooking with butter, cream, or high-fat meats.  Meal planning  · Eat meals and snacks regularly, preferably at the same times every day. Avoid going long periods of time without eating.  · Eat foods high in fiber, such as fresh fruits, vegetables, beans, and whole grains. Talk to your dietitian about how many servings of carbs you can eat at each meal.  · Eat 4-6 ounces (oz) of lean protein each day, such as lean meat, chicken, fish, eggs, or tofu. One oz of lean protein is equal to:  ? 1 oz of meat, chicken, or fish.  ? 1 egg.  ? ¼ cup of tofu.  · Eat some foods each day that contain healthy fats, such as avocado, nuts, seeds, and fish.  Lifestyle  · Check your blood glucose regularly.  · Exercise regularly as told by your health care provider. This may include:  ? 150 minutes of moderate-intensity or vigorous-intensity exercise each week. This could be brisk walking, biking, or water aerobics.  ? Stretching and doing strength exercises, such as yoga or weightlifting, at least 2 times a week.  · Take medicines as told by your health care provider.  · Do not use any products that contain nicotine or tobacco, such as cigarettes and e-cigarettes. If you need help quitting, ask your health care provider.  · Work with a counselor or diabetes educator to identify strategies to manage stress and any emotional and social challenges.  Questions to ask a health care provider  · Do I need to meet with a diabetes educator?  · Do I need to meet with a dietitian?  · What number can I call if I have questions?  · When are the best times to check my blood glucose?  Where to find more  information:  · American Diabetes Association: diabetes.org  · Academy of Nutrition and Dietetics: www.eatright.org  · National Institute of Diabetes and Digestive and Kidney Diseases (NIH): www.niddk.nih.gov  Summary  · A healthy meal plan will help you control your blood glucose and maintain a healthy lifestyle.  · Working with a diet and nutrition specialist (dietitian) can help you make a meal plan that is best for you.  · Keep in mind that carbohydrates (carbs) and alcohol have immediate effects on your blood glucose levels. It is important to count carbs and to use alcohol carefully.  This information is not intended to   replace advice given to you by your health care provider. Make sure you discuss any questions you have with your health care provider.  Document Released: 08/01/2005 Document Revised: 06/04/2017 Document Reviewed: 12/09/2016  Elsevier Interactive Patient Education © 2019 Elsevier Inc.

## 2018-12-22 LAB — URINALYSIS, COMPLETE
Bilirubin, UA: NEGATIVE
Ketones, UA: NEGATIVE
Leukocytes, UA: NEGATIVE
Nitrite, UA: NEGATIVE
Protein, UA: NEGATIVE
Specific Gravity, UA: 1.02 (ref 1.005–1.030)
Urobilinogen, Ur: 0.2 mg/dL (ref 0.2–1.0)
pH, UA: 5 (ref 5.0–7.5)

## 2018-12-22 LAB — CMP14+EGFR
ALT: 31 IU/L (ref 0–32)
AST: 34 IU/L (ref 0–40)
Albumin/Globulin Ratio: 1.5 (ref 1.2–2.2)
Albumin: 4.3 g/dL (ref 3.8–4.9)
Alkaline Phosphatase: 77 IU/L (ref 39–117)
BUN / CREAT RATIO: 26 — AB (ref 9–23)
BUN: 37 mg/dL — ABNORMAL HIGH (ref 6–24)
Bilirubin Total: <0.2 mg/dL (ref 0.0–1.2)
CO2: 21 mmol/L (ref 20–29)
Calcium: 9.8 mg/dL (ref 8.7–10.2)
Chloride: 104 mmol/L (ref 96–106)
Creatinine, Ser: 1.42 mg/dL — ABNORMAL HIGH (ref 0.57–1.00)
GFR calc Af Amer: 49 mL/min/{1.73_m2} — ABNORMAL LOW (ref >59)
GFR, EST NON AFRICAN AMERICAN: 42 mL/min/{1.73_m2} — AB (ref >59)
Globulin, Total: 2.9 g/dL (ref 1.5–4.5)
Glucose: 131 mg/dL — ABNORMAL HIGH (ref 65–99)
Potassium: 4.5 mmol/L (ref 3.5–5.2)
Sodium: 140 mmol/L (ref 134–144)
Total Protein: 7.2 g/dL (ref 6.0–8.5)

## 2018-12-22 LAB — MICROSCOPIC EXAMINATION: Renal Epithel, UA: NONE SEEN /hpf

## 2018-12-22 LAB — BAYER DCA HB A1C WAIVED: HB A1C (BAYER DCA - WAIVED): 7.8 % — ABNORMAL HIGH (ref <7.0)

## 2018-12-25 ENCOUNTER — Ambulatory Visit: Payer: BLUE CROSS/BLUE SHIELD | Admitting: Family Medicine

## 2019-01-22 ENCOUNTER — Encounter: Payer: Self-pay | Admitting: Family Medicine

## 2019-01-22 ENCOUNTER — Ambulatory Visit: Payer: BLUE CROSS/BLUE SHIELD | Admitting: Family Medicine

## 2019-01-22 VITALS — BP 121/78 | HR 89 | Temp 97.0°F | Ht 63.0 in | Wt 261.0 lb

## 2019-01-22 DIAGNOSIS — E079 Disorder of thyroid, unspecified: Secondary | ICD-10-CM | POA: Diagnosis not present

## 2019-01-22 DIAGNOSIS — J209 Acute bronchitis, unspecified: Secondary | ICD-10-CM | POA: Diagnosis not present

## 2019-01-22 DIAGNOSIS — E781 Pure hyperglyceridemia: Secondary | ICD-10-CM

## 2019-01-22 DIAGNOSIS — J069 Acute upper respiratory infection, unspecified: Secondary | ICD-10-CM

## 2019-01-22 DIAGNOSIS — E559 Vitamin D deficiency, unspecified: Secondary | ICD-10-CM

## 2019-01-22 DIAGNOSIS — B9789 Other viral agents as the cause of diseases classified elsewhere: Secondary | ICD-10-CM

## 2019-01-22 DIAGNOSIS — E1165 Type 2 diabetes mellitus with hyperglycemia: Secondary | ICD-10-CM

## 2019-01-22 MED ORDER — AZITHROMYCIN 250 MG PO TABS: ORAL_TABLET | ORAL | 0 refills | Status: DC

## 2019-01-22 MED ORDER — METHYLPREDNISOLONE ACETATE 80 MG/ML IJ SUSP
60.0000 mg | Freq: Once | INTRAMUSCULAR | Status: AC
  Administered 2019-01-22: 60 mg via INTRAMUSCULAR

## 2019-01-22 NOTE — Progress Notes (Signed)
Subjective:    Patient ID: Holly Rodriguez, female    DOB: 04-21-1966, 53 y.o.   MRN: [id number]  HPI Pt here for follow up and management of chronic medical problems which includes diabetes and hyperlipidemia. He is taking medication regularly.  Patient comes in today for her regular checkup.  She has been on Januvia for 1 month.  She complains today of headache cough and sore throat that is been going on for 3 days.  Her mammogram is due and she will schedule this.  She recently had a colonoscopy on January 20.  She has had lab work done and we will review this with her during the visit today.  The hemoglobin A1c was 7.8% and this was 1 month ago.  The patient has been on the [facility] for 1 month.  The blood sugar fasting was 131.  The creatinine, most important kidney function test remains elevated at 1.42 similar to previous readings.  All of the electrolytes including potassium are good and the liver function test are all normal.  The previous urine specimen did have 2+ sugar 1+ red blood cells.  Microscopically there were 3-10 red blood cells and 0-5 WBC and it was previously.  Patient is pleasant and doing well other than having this recent cold and cough and drainage into her chest.  She is not been running any fever.  It is most likely viral and allergy in nature.  She thinks this is the case also but she is going on this trip and is concerned about getting worse on the trip.  She denies any chest pain or any worsening shortness of breath than she would expect with a cough and congestion.  Her bowels are working fine with no nausea vomiting diarrhea blood in the stool or black tarry bowel movements.  She is passing her water without problems.  Her recent blood sugars were reviewed and since going on the Januvia she feels like she may be doing some better but still has some room for improvement.  The hemoglobin A1c was done 1 month ago before starting the Januvia so we will wait until we see her  back the next time to repeat this.  She will continue to monitor blood sugars at home and watch her diet closely.    Patient Active Problem List   Diagnosis Date Noted  . Recurrent herpes simplex 09/22/2017  . Type 2 diabetes mellitus with diabetic chronic kidney disease ([place]) 09/22/2017  . Thyroid disease 09/13/2017  . Hypertriglyceridemia 03/22/2016  . Grief at loss of child 02/03/2016  . Morbid obesity ([place]) 11/15/2014  . Colon polyps 08/03/2014  . Hemorrhoids, external 08/03/2014  . Prolapsed internal hemorrhoids, grade 1 08/03/2014  . Iron deficiency 06/22/2014  . Type 2 diabetes mellitus not at goal [facility]) 12/06/2013  . Hypertension associated with diabetes ([place]) 12/06/2013  . Acute appendicitis 10/25/2013   Outpatient Encounter Medications as of 01/22/2019  Medication Sig  . allopurinol (ZYLOPRIM) 300 MG tablet TAKE 1 TABLET BY MOUTH  DAILY FOR GOUT PREVENTION  . ALPRAZolam (XANAX) 0.5 MG tablet Take 2 tablets (1 mg total) by mouth at bedtime as needed for anxiety.  . colchicine 0.6 MG tablet Take 1 tablet (0.6 mg total) by mouth 2 (two) times daily. Until symptoms clear.  . fenofibrate 160 MG tablet TAKE 1 TABLET BY MOUTH  EVERY DAY  . fluticasone (FLONASE) 50 MCG/ACT nasal spray Place 2 sprays into both nostrils daily.  Marland Kitchen glucose blood test strip Reli-on  glucometer.  Test Blood sugar qid Dx. 250.01  . Lancets (ONETOUCH ULTRASOFT) lancets Test 1X per day and prn   Dx 250.01  . levothyroxine (SYNTHROID, LEVOTHROID) 75 MCG tablet TAKE 1 TABLET BY MOUTH  DAILY BEFORE BREAKFAST  . lisinopril (PRINIVIL,ZESTRIL) 20 MG tablet TAKE 1 TABLET BY MOUTH  DAILY  . Multiple Vitamins-Minerals (ALIVE WOMENS ENERGY PO) Take 1 tablet by mouth daily. Reported on 03/22/2016  . Omega-3 Fatty Acids (OMEGA 3 PO) Take 4 capsules by mouth daily.  Marland Kitchen omeprazole (PRILOSEC) 40 MG capsule TAKE 1 CAPSULE BY MOUTH  DAILY  . potassium chloride (K-DUR) 10 MEQ tablet TAKE 1 TABLET BY MOUTH  DAILY  . sitaGLIPtin  (JANUVIA) 100 MG tablet Take 1 tablet (100 mg total) by mouth daily.  Marland Kitchen SYNJARDY 12.03-999 MG TABS TAKE 1 TABLET BY MOUTH TWO  TIMES DAILY  . valACYclovir (VALTREX) 1000 MG tablet Take 1 tablet (1,000 mg total) daily by mouth. As directed  . Vitamin D, Ergocalciferol, (DRISDOL) 1.25 MG (50000 UT) CAPS capsule TAKE 1 CAPSULE BY MOUTH  EVERY 7 DAYS  . [DISCONTINUED] amoxicillin-clavulanate (AUGMENTIN) 875-125 MG tablet Take 1 tablet by mouth 2 (two) times daily.   No facility-administered encounter medications on file as of 01/22/2019.      Review of Systems  Constitutional: Negative.  Negative for fever.  HENT: Positive for sore throat.   Eyes: Negative.   Respiratory: Positive for cough.   Cardiovascular: Negative.   Gastrointestinal: Negative.   Endocrine: Negative.   Genitourinary: Negative.   Musculoskeletal: Negative.   Skin: Negative.   Allergic/Immunologic: Negative.   Neurological: Positive for headaches.  Hematological: Negative.   Psychiatric/Behavioral: Negative.        Objective:   Physical Exam Vitals signs and nursing note reviewed.  Constitutional:      General: She is not in acute distress.    Appearance: Normal appearance. She is well-developed. She is obese.     Comments: Patient is pleasant and alert and excited about going on a trip since she has been so confined to her home over the past year due to her mother's death etc.  She is leaving for the trip tomorrow.  HENT:     Head: Normocephalic and atraumatic.     Right Ear: Tympanic membrane, ear canal and external ear normal. There is no impacted cerumen.     Left Ear: Tympanic membrane, ear canal and external ear normal. There is no impacted cerumen.     Nose: Congestion present.     Comments: Nasal turbinate congestion bilaterally    Mouth/Throat:     Mouth: Mucous membranes are moist.     Pharynx: Oropharynx is clear. No oropharyngeal exudate or posterior oropharyngeal erythema.  Eyes:     General: No  scleral icterus.       Right eye: No discharge.        Left eye: No discharge.     Extraocular Movements: Extraocular movements intact.     Conjunctiva/sclera: Conjunctivae normal.     Pupils: Pupils are equal, round, and reactive to light.  Neck:     Musculoskeletal: Normal range of motion and neck supple.     Thyroid: No thyromegaly.     Vascular: No carotid bruit or JVD.     Comments: No bruits thyromegaly or anterior cervical adenopathy Cardiovascular:     Rate and Rhythm: Normal rate and regular rhythm.     Pulses: Normal pulses.     Heart sounds: Normal heart sounds. No  murmur.     Comments: Heart is regular at 76/min.  Good pedal pulses and no edema. Pulmonary:     Effort: Pulmonary effort is normal. No respiratory distress.     Breath sounds: Normal breath sounds. No wheezing or rales.     Comments: Clear anteriorly and posteriorly but tight cough. Abdominal:     General: Bowel sounds are normal.     Palpations: Abdomen is soft. There is no mass.     Tenderness: There is no abdominal tenderness. There is no rebound.     Comments: Obese without masses tenderness organ enlargement or bruits  Musculoskeletal: Normal range of motion.        General: No tenderness or deformity.     Right lower leg: No edema.     Left lower leg: No edema.  Lymphadenopathy:     Cervical: No cervical adenopathy.  Skin:    General: Skin is warm and dry.     Findings: No rash.  Neurological:     General: No focal deficit present.     Mental Status: She is alert and oriented to person, place, and time. Mental status is at baseline.     Cranial Nerves: No cranial nerve deficit.     Gait: Gait normal.     Deep Tendon Reflexes: Reflexes are normal and symmetric. Reflexes normal.  Psychiatric:        Mood and Affect: Mood normal.        Behavior: Behavior normal.        Thought Content: Thought content normal.        Judgment: Judgment normal.     Comments: Mood affect and behavior are all  normal for this patient    BP 121/78 (BP Location: Left Arm)   Pulse 89   Temp (!) 97 F (36.1 C) (Oral)   Ht 5\' 3"  (1.6 m)   Wt 261 lb (118.4 kg)   BMI 46.23 kg/m         Assessment & Plan:  1. Type 2 diabetes mellitus with hyperglycemia, without long-term current use of insulin (HCC) -Continue with current treatment, recheck hemoglobin A1c at next visit -Continue aggressive therapeutic lifestyle changes including diet and exercise to achieve weight loss  2. Hypertriglyceridemia -Continue with current treatment  3. Thyroid disease -Continue with current treatment  4. Vitamin D deficiency -Continue with calcium and vitamin D replacement  5. Viral URI with cough -Take Mucinex, use nasal saline, drink plenty of water and fluids.  6. Morbid obesity ([place]) -Continue to work aggressively on weight with diet and exercise  7. Bronchitis with bronchospasm -A prescription was written for a Z-Pak and the patient was told not to take this unless her symptoms get worse on this trip.  Meds ordered this encounter  Medications  . azithromycin (ZITHROMAX) 250 MG tablet    Sig: As directed    Dispense:  6 tablet    Refill:  0   Patient Instructions  Continue current medications. Continue good therapeutic lifestyle changes which include good diet and exercise. Fall precautions discussed with patient. If an FOBT was given today- please return it to our front desk. If you are over 24 years old - you may need Prevnar 32 or the adult Pneumonia vaccine.  **Flu shots are available--- please call and schedule a FLU-CLINIC appointment**  After your visit with [place] today you will receive a survey in the mail or online from [employer] regarding your care with [place]. Please take a  moment to fill this out. Your feedback is very important to [place] as you can help [place] better understand your patient needs as well as improve your experience and satisfaction. WE CARE ABOUT YOU!!!  Continue to drink  plenty of fluids and stay well-hydrated Take Mucinex maximum strength, blue and white in color, 1 twice daily for cough and congestion to loosen congestion Use nasal saline frequently in each nostril throughout the day Take Tylenol for aches pains and fever If the respiratory condition gets worse we will give you written prescription to take with you to have filled for a Z-Pak.  This would be to use if you get worse. Continue to practice good respiratory hygiene and hand hygiene.   Arrie Senate MD

## 2019-01-22 NOTE — Addendum Note (Signed)
Addended by: Zannie Cove on: 01/22/2019 03:54 PM   Modules accepted: Orders

## 2019-01-22 NOTE — Patient Instructions (Addendum)
Continue current medications. Continue good therapeutic lifestyle changes which include good diet and exercise. Fall precautions discussed with patient. If an FOBT was given today- please return it to our front desk. If you are over 53 years old - you may need Prevnar 77 or the adult Pneumonia vaccine.  **Flu shots are available--- please call and schedule a FLU-CLINIC appointment**  After your visit with [place] today you will receive a survey in the mail or online from [employer] regarding your care with [place]. Please take a moment to fill this out. Your feedback is very important to [place] as you can help [place] better understand your patient needs as well as improve your experience and satisfaction. WE CARE ABOUT YOU!!!  Continue to drink plenty of fluids and stay well-hydrated Take Mucinex maximum strength, blue and white in color, 1 twice daily for cough and congestion to loosen congestion Use nasal saline frequently in each nostril throughout the day Take Tylenol for aches pains and fever If the respiratory condition gets worse we will give you written prescription to take with you to have filled for a Z-Pak.  This would be to use if you get worse. Continue to practice good respiratory hygiene and hand hygiene.

## 2019-02-05 ENCOUNTER — Telehealth: Payer: Self-pay | Admitting: Pulmonary Disease

## 2019-02-05 DIAGNOSIS — G4733 Obstructive sleep apnea (adult) (pediatric): Secondary | ICD-10-CM

## 2019-02-05 NOTE — Telephone Encounter (Signed)
Instructions      Return in about 6 months (around 05/28/2019).  Will arrange for new medical supply company Will arrange for new SD card and get copy of CPAP report  Follow up in 6 months       ____________________________  Patient stated that she never got her CPAP supplies. VS please advise if we can send this in. Thank you.

## 2019-02-07 NOTE — Telephone Encounter (Signed)
Okay to send orders 

## 2019-02-08 NOTE — Telephone Encounter (Signed)
Called [facility], spoke with Mariann Laster, respiratory therapist. She confirmed their last contact with the patient was July 2019. The last order/prescription they have is over a year old. A new one will need to be sent.  Order sent.

## 2019-02-08 NOTE — Telephone Encounter (Signed)
Called the patient to let her know the order was being placed. She stated she did not want to use [facility] because they are privately owned and charge higher.  She is willing to use [facility], [facility] or [facility]. Whichever agency can charge to her insurance and it is less cost for her.  Nothing further needed at this time.

## 2019-02-12 ENCOUNTER — Other Ambulatory Visit: Payer: Self-pay | Admitting: Family Medicine

## 2019-02-15 ENCOUNTER — Encounter: Payer: Self-pay | Admitting: Family Medicine

## 2019-04-06 DIAGNOSIS — E039 Hypothyroidism, unspecified: Secondary | ICD-10-CM | POA: Diagnosis not present

## 2019-04-06 DIAGNOSIS — Z01419 Encounter for gynecological examination (general) (routine) without abnormal findings: Secondary | ICD-10-CM | POA: Diagnosis not present

## 2019-04-06 DIAGNOSIS — Z6841 Body Mass Index (BMI) 40.0 and over, adult: Secondary | ICD-10-CM | POA: Diagnosis not present

## 2019-04-06 DIAGNOSIS — N915 Oligomenorrhea, unspecified: Secondary | ICD-10-CM | POA: Diagnosis not present

## 2019-04-06 DIAGNOSIS — Z1231 Encounter for screening mammogram for malignant neoplasm of breast: Secondary | ICD-10-CM | POA: Diagnosis not present

## 2019-04-14 ENCOUNTER — Encounter: Payer: Self-pay | Admitting: Family Medicine

## 2019-04-14 DIAGNOSIS — F411 Generalized anxiety disorder: Secondary | ICD-10-CM

## 2019-04-14 MED ORDER — VITAMIN D (ERGOCALCIFEROL) 1.25 MG (50000 UNIT) PO CAPS: ORAL_CAPSULE | ORAL | 0 refills | Status: DC

## 2019-04-14 MED ORDER — ALPRAZOLAM 0.5 MG PO TABS: 1.0000 mg | ORAL_TABLET | Freq: Every evening | ORAL | 1 refills | Status: DC | PRN

## 2019-05-03 ENCOUNTER — Other Ambulatory Visit: Payer: Self-pay | Admitting: *Deleted

## 2019-05-03 ENCOUNTER — Other Ambulatory Visit: Payer: Self-pay

## 2019-05-03 ENCOUNTER — Ambulatory Visit (INDEPENDENT_AMBULATORY_CARE_PROVIDER_SITE_OTHER): Payer: BC Managed Care – PPO | Admitting: Physician Assistant

## 2019-05-03 DIAGNOSIS — M62838 Other muscle spasm: Secondary | ICD-10-CM | POA: Diagnosis not present

## 2019-05-03 DIAGNOSIS — M109 Gout, unspecified: Secondary | ICD-10-CM

## 2019-05-03 MED ORDER — PREDNISONE 10 MG (21) PO TBPK: ORAL_TABLET | ORAL | 0 refills | Status: DC

## 2019-05-03 MED ORDER — CYCLOBENZAPRINE HCL 10 MG PO TABS: 10.0000 mg | ORAL_TABLET | Freq: Three times a day (TID) | ORAL | 0 refills | Status: DC | PRN

## 2019-05-03 MED ORDER — COLCHICINE 0.6 MG PO TABS: 0.6000 mg | ORAL_TABLET | Freq: Two times a day (BID) | ORAL | 1 refills | Status: DC

## 2019-05-05 ENCOUNTER — Telehealth: Payer: Self-pay | Admitting: Family Medicine

## 2019-05-05 ENCOUNTER — Encounter: Payer: Self-pay | Admitting: Physician Assistant

## 2019-05-05 NOTE — Telephone Encounter (Signed)
Called pt =  Currently taking for BS:  januvia 100 mg QD(been out 2 days) synjardy 12.03-999  BID (taking regularly)   Prednisone was just started yesterday and BS was already high before. (angel gave)  Fasting running:  265 212 219 205 208 186 164 176 199 259   Up to ---- after eating:  427 last night Mid 300's one evening Usually in 300's when not fasting  She is needing to know what to do about BS and if she should continue taking prednisone??

## 2019-05-05 NOTE — Telephone Encounter (Signed)
In reviewing her medications it appears that the next step is for an injectable option such as Victoza or insulin.  I really think this needs to be done with a visit to her PCP.  Is there any way to get her in sooner with Sharyn Lull?  She does not have an appointment until 05/20/2019.  Until then she is to be as a low car as possible in her eating.  Eat no sugar, flour, bread, potatoes, rice.

## 2019-05-05 NOTE — Telephone Encounter (Signed)
She needs to also know what else to take for sugar. She was running this high prior to start of PRED yesterday.

## 2019-05-05 NOTE — Telephone Encounter (Signed)
Who is her PCP?

## 2019-05-05 NOTE — Telephone Encounter (Signed)
Never mind, I will look

## 2019-05-05 NOTE — Telephone Encounter (Signed)
Pt called and aware - appt changed

## 2019-05-05 NOTE — Telephone Encounter (Signed)
Okay to stop the prednisone °

## 2019-05-05 NOTE — Progress Notes (Signed)
Telephone visit  Subjective: ZO:XWRU PCP: Baruch Gouty, FNP EAV:[id number] Holly Rodriguez is a 53 y.o. female calls for telephone consult today. Patient provides verbal consent for consult held via phone.  Patient is identified with 2 separate identifiers.  At this time the entire area is on COVID-19 social distancing and stay home orders are in place.  Patient is of higher risk and therefore we are performing this by a virtual method.  Location of patient: home Location of provider: HOME Others present for call: no  This patient has had a gout flareup for 1 day.  She has had many years of not having any episodes.  She does take allopurinol 300 mg 1 daily.  However when this flared up colchicine 0.6 mg was sent in by her PCP.  It was too cost prohibitive for her to be able to buy it.  I have suggested that she try to buy a smaller female if she can pay cash for it.  And also to look on good Rx for options of getting a lower price.  In the meantime we will have her try some prednisone to see if this can help calm down some of the inflammation.  She also reports that she has had ongoing cervical neck spasm.  She had some issues with it sometime ago.  However it has significantly flareup also.  We have talked about using heat and stretching.  Also the anti-inflammatory should help this calm down.  She can also use muscle rubs to this area.  ROS: Per HPI  No Known Allergies Past Medical History:  Diagnosis Date  . Anxiety   . Arthritis    Gout  . Diabetes mellitus without complication ([place])   . GERD (gastroesophageal reflux disease)   . Gout   . IBS (irritable bowel syndrome)   . Sleep apnea   . Thyroid disease     Current Outpatient Medications:  .  allopurinol (ZYLOPRIM) 300 MG tablet, TAKE 1 TABLET BY MOUTH  DAILY FOR GOUT PREVENTION, Disp: 90 tablet, Rfl: 1 .  ALPRAZolam (XANAX) 0.5 MG tablet, Take 2 tablets (1 mg total) by mouth at bedtime as needed for anxiety., Disp: 90 tablet,  Rfl: 1 .  colchicine 0.6 MG tablet, Take 1 tablet (0.6 mg total) by mouth 2 (two) times daily. Until symptoms clear., Disp: 90 tablet, Rfl: 1 .  fenofibrate 160 MG tablet, TAKE 1 TABLET BY MOUTH  EVERY DAY, Disp: 90 tablet, Rfl: 1 .  fluticasone (FLONASE) 50 MCG/ACT nasal spray, Place 2 sprays into both nostrils daily., Disp: 16 g, Rfl: 6 .  glucose blood test strip, Reli-on glucometer.  Test Blood sugar qid Dx. 250.01, Disp: , Rfl:  .  Lancets (ONETOUCH ULTRASOFT) lancets, Test 1X per day and prn   Dx 250.01, Disp: 100 each, Rfl: 12 .  levothyroxine (SYNTHROID, LEVOTHROID) 75 MCG tablet, TAKE 1 TABLET BY MOUTH  DAILY BEFORE BREAKFAST, Disp: 90 tablet, Rfl: 3 .  lisinopril (PRINIVIL,ZESTRIL) 20 MG tablet, TAKE 1 TABLET BY MOUTH  DAILY, Disp: 90 tablet, Rfl: 1 .  Multiple Vitamins-Minerals (ALIVE WOMENS ENERGY PO), Take 1 tablet by mouth daily. Reported on 03/22/2016, Disp: , Rfl:  .  Omega-3 Fatty Acids (OMEGA 3 PO), Take 4 capsules by mouth daily., Disp: , Rfl:  .  omeprazole (PRILOSEC) 40 MG capsule, TAKE 1 CAPSULE BY MOUTH  DAILY, Disp: 90 capsule, Rfl: 1 .  potassium chloride (K-DUR) 10 MEQ tablet, TAKE 1 TABLET BY MOUTH  DAILY, Disp: 90 tablet, Rfl: 1 .  predniSONE (STERAPRED UNI-PAK 21 TAB) 10 MG (21) TBPK tablet, As directed x 6 days, Disp: 21 tablet, Rfl: 0 .  sitaGLIPtin (JANUVIA) 100 MG tablet, Take 1 tablet (100 mg total) by mouth daily., Disp: 90 tablet, Rfl: 1 .  SYNJARDY 12.03-999 MG TABS, TAKE 1 TABLET BY MOUTH TWO  TIMES DAILY, Disp: 180 tablet, Rfl: 0 .  valACYclovir (VALTREX) 1000 MG tablet, Take 1 tablet (1,000 mg total) daily by mouth. As directed, Disp: 30 tablet, Rfl: 1 .  Vitamin D, Ergocalciferol, (DRISDOL) 1.25 MG (50000 UT) CAPS capsule, Take one capsule by mouth every 7 days, Disp: 13 capsule, Rfl: 0 .  cyclobenzaprine (FLEXERIL) 10 MG tablet, Take 1 tablet (10 mg total) by mouth 3 (three) times daily as needed for muscle spasms., Disp: 60 tablet, Rfl: 0  Assessment/ Plan:  53 y.o. female   1. Neck muscle spasm Heat and stretching Prednisone Dosepak  2. Acute gout, unspecified cause, unspecified site Prednisone Dosepak Consider paying cash for a few day supply of colchicine.   Continue all other maintenance medications as listed above.  Start time: 3:45 PM End time: 4:00 PM  Meds ordered this encounter  Medications  . predniSONE (STERAPRED UNI-PAK 21 TAB) 10 MG (21) TBPK tablet    Sig: As directed x 6 days    Dispense:  21 tablet    Refill:  0    Order Specific Question:   Supervising Provider    Answer:   Janora Norlander [[id number]]  . cyclobenzaprine (FLEXERIL) 10 MG tablet    Sig: Take 1 tablet (10 mg total) by mouth 3 (three) times daily as needed for muscle spasms.    Dispense:  60 tablet    Refill:  0    Order Specific Question:   Supervising Provider    Answer:   Janora Norlander [[id number]]    Particia Nearing PA-C [place] [phone]

## 2019-05-06 ENCOUNTER — Other Ambulatory Visit: Payer: Self-pay

## 2019-05-07 ENCOUNTER — Ambulatory Visit: Payer: BC Managed Care – PPO | Admitting: Family Medicine

## 2019-05-07 ENCOUNTER — Encounter: Payer: Self-pay | Admitting: Family Medicine

## 2019-05-07 VITALS — BP 115/77 | HR 86 | Temp 99.3°F | Ht 63.0 in | Wt 255.0 lb

## 2019-05-07 DIAGNOSIS — E1159 Type 2 diabetes mellitus with other circulatory complications: Secondary | ICD-10-CM

## 2019-05-07 DIAGNOSIS — E1165 Type 2 diabetes mellitus with hyperglycemia: Secondary | ICD-10-CM

## 2019-05-07 DIAGNOSIS — E785 Hyperlipidemia, unspecified: Secondary | ICD-10-CM

## 2019-05-07 DIAGNOSIS — I152 Hypertension secondary to endocrine disorders: Secondary | ICD-10-CM

## 2019-05-07 DIAGNOSIS — E079 Disorder of thyroid, unspecified: Secondary | ICD-10-CM

## 2019-05-07 DIAGNOSIS — E1169 Type 2 diabetes mellitus with other specified complication: Secondary | ICD-10-CM

## 2019-05-07 DIAGNOSIS — I10 Essential (primary) hypertension: Secondary | ICD-10-CM | POA: Diagnosis not present

## 2019-05-07 LAB — BAYER DCA HB A1C WAIVED: HB A1C (BAYER DCA - WAIVED): 8 % — ABNORMAL HIGH (ref <7.0)

## 2019-05-07 NOTE — Progress Notes (Signed)
Subjective:  Patient ID: Holly Rodriguez, female    DOB: Feb 20, 1966, 53 y.o.   MRN: [id number]  Chief Complaint:  Medical Management of Chronic Issues and Diabetes   HPI: Holly Rodriguez is a 53 y.o. female presenting on 05/07/2019 for Medical Management of Chronic Issues and Diabetes   1. Type 2 diabetes mellitus with hyperglycemia, without long-term current use of insulin (HCC)  Diabetes mellitus 2 Compliant with meds - Yes, pt started Januvia at last visit. Pt states she would like to be referred to an endocrinologist. States she has seen a diabetic educator in the past and did not find this helpful.  Checking CBGs? Yes  AM: 180-200             Noon: 130             PM: 200-350, highest 44 Exercising regularly? - No Watching carbohydrate intake? - No Neuropathy ? - No Hypoglycemic events - No Pertinent ROS:  Polyuria - Yes Polydipsia - No Vision problems - No   2. Hypertension associated with diabetes ([place])  Complaint with meds - Yes Checking BP at home - No Exercising Regularly - No Watching Salt intake - No Pertinent ROS:  Headache - No Chest pain - No Dyspnea - No Palpitations - No LE edema - No They report good compliance with medications and can restate their regimen by memory. No medication side effects.  BP Readings from Last 3 Encounters:  05/07/19 115/77  01/22/19 121/78  12/21/18 114/72     3. Hyperlipidemia associated with type 2 diabetes mellitus ([place])  Compliant with medications. Does not watch diet or exercise on a regular basis.    4. Thyroid disease  Compliant with medications. No constipation, diarrhea, cold or hot intolerance, mood disturbances, weight changes, or hair or nail changes.      Relevant past medical, surgical, family, and social history reviewed and updated as indicated.  Allergies and medications reviewed and updated.   Past Medical History:  Diagnosis Date  . Anxiety   . Arthritis    Gout  . Diabetes mellitus  without complication ([place])   . GERD (gastroesophageal reflux disease)   . Gout   . IBS (irritable bowel syndrome)   . Sleep apnea   . Thyroid disease     Past Surgical History:  Procedure Laterality Date  . CESAREAN SECTION    . CHOLECYSTECTOMY    . GASTRIC RESTRICTION SURGERY    . LAPAROSCOPIC APPENDECTOMY N/A 10/25/2013   Procedure: APPENDECTOMY LAPAROSCOPIC;  Surgeon: Jamesetta So, MD;  Location: [facility];  Service: General;  Laterality: N/A;  . TONSILLECTOMY    . TUBAL LIGATION      Social History   Socioeconomic History  . Marital status: Single    Spouse name: Not on file  . Number of children: Not on file  . Years of education: Not on file  . Highest education level: Not on file  Occupational History  . Not on file  Social Needs  . Financial resource strain: Not on file  . Food insecurity    Worry: Not on file    Inability: Not on file  . Transportation needs    Medical: Not on file    Non-medical: Not on file  Tobacco Use  . Smoking status: Never Smoker  . Smokeless tobacco: Never Used  Substance and Sexual Activity  . Alcohol use: Yes    Comment: occasional  . Drug use: No  .  Sexual activity: Not on file  Lifestyle  . Physical activity    Days per week: Not on file    Minutes per session: Not on file  . Stress: Not on file  Relationships  . Social Herbalist on phone: Not on file    Gets together: Not on file    Attends religious service: Not on file    Active member of club or organization: Not on file    Attends meetings of clubs or organizations: Not on file    Relationship status: Not on file  . Intimate partner violence    Fear of current or ex partner: Not on file    Emotionally abused: Not on file    Physically abused: Not on file    Forced sexual activity: Not on file  Other Topics Concern  . Not on file  Social History Narrative  . Not on file    Outpatient Encounter Medications as of 05/07/2019  Medication Sig  .  allopurinol (ZYLOPRIM) 300 MG tablet TAKE 1 TABLET BY MOUTH  DAILY FOR GOUT PREVENTION  . ALPRAZolam (XANAX) 0.5 MG tablet Take 2 tablets (1 mg total) by mouth at bedtime as needed for anxiety.  . colchicine 0.6 MG tablet Take 1 tablet (0.6 mg total) by mouth 2 (two) times daily. Until symptoms clear.  . cyclobenzaprine (FLEXERIL) 10 MG tablet Take 1 tablet (10 mg total) by mouth 3 (three) times daily as needed for muscle spasms.  . fenofibrate 160 MG tablet TAKE 1 TABLET BY MOUTH  EVERY DAY  . levothyroxine (SYNTHROID, LEVOTHROID) 75 MCG tablet TAKE 1 TABLET BY MOUTH  DAILY BEFORE BREAKFAST  . lisinopril (PRINIVIL,ZESTRIL) 20 MG tablet TAKE 1 TABLET BY MOUTH  DAILY  . Omega-3 Fatty Acids (OMEGA 3 PO) Take 4 capsules by mouth daily.  Marland Kitchen omeprazole (PRILOSEC) 40 MG capsule TAKE 1 CAPSULE BY MOUTH  DAILY  . potassium chloride (K-DUR) 10 MEQ tablet TAKE 1 TABLET BY MOUTH  DAILY  . sitaGLIPtin (JANUVIA) 100 MG tablet Take 1 tablet (100 mg total) by mouth daily.  Marland Kitchen SYNJARDY 12.03-999 MG TABS TAKE 1 TABLET BY MOUTH TWO  TIMES DAILY  . valACYclovir (VALTREX) 1000 MG tablet Take 1 tablet (1,000 mg total) daily by mouth. As directed  . Vitamin D, Ergocalciferol, (DRISDOL) 1.25 MG (50000 UT) CAPS capsule Take one capsule by mouth every 7 days  . glucose blood test strip Reli-on glucometer.  Test Blood sugar qid Dx. 250.01  . Lancets (ONETOUCH ULTRASOFT) lancets Test 1X per day and prn   Dx 250.01  . [DISCONTINUED] fluticasone (FLONASE) 50 MCG/ACT nasal spray Place 2 sprays into both nostrils daily.  . [DISCONTINUED] Multiple Vitamins-Minerals (ALIVE WOMENS ENERGY PO) Take 1 tablet by mouth daily. Reported on 03/22/2016  . [DISCONTINUED] predniSONE (STERAPRED UNI-PAK 21 TAB) 10 MG (21) TBPK tablet As directed x 6 days (Patient not taking: Reported on 05/07/2019)   No facility-administered encounter medications on file as of 05/07/2019.     No Known Allergies  Review of Systems  Constitutional: Negative for  activity change, appetite change, chills, fatigue and fever.  Eyes: Negative for photophobia and visual disturbance.  Respiratory: Negative for cough, chest tightness and shortness of breath.   Cardiovascular: Negative for chest pain, palpitations and leg swelling.  Gastrointestinal: Negative for abdominal distention, abdominal pain, constipation, diarrhea, nausea and vomiting.  Endocrine: Positive for polyuria. Negative for cold intolerance, heat intolerance, polydipsia and polyphagia.  Genitourinary: Negative for decreased urine volume  and difficulty urinating.  Musculoskeletal: Negative for arthralgias, joint swelling and myalgias.  Skin: Negative for color change and rash.  Neurological: Negative for dizziness, tremors, seizures, syncope, facial asymmetry, speech difficulty, weakness, light-headedness, numbness and headaches.  Psychiatric/Behavioral: Negative for confusion.  All other systems reviewed and are negative.       Objective:  BP 115/77   Pulse 86   Temp 99.3 F (37.4 C) (Oral)   Ht 5' 3"  (1.6 m)   Wt 255 lb (115.7 kg)   BMI 45.17 kg/m    Wt Readings from Last 3 Encounters:  05/07/19 255 lb (115.7 kg)  01/22/19 261 lb (118.4 kg)  12/21/18 259 lb (117.5 kg)    Physical Exam Vitals signs and nursing note reviewed.  Constitutional:      General: She is not in acute distress.    Appearance: Normal appearance. She is well-developed and well-groomed. She is obese. She is not ill-appearing, toxic-appearing or diaphoretic.  HENT:     Head: Normocephalic and atraumatic.     Jaw: There is normal jaw occlusion.     Right Ear: Hearing normal.     Left Ear: Hearing normal.     Nose: Nose normal.     Mouth/Throat:     Lips: Pink.     Mouth: Mucous membranes are moist.     Pharynx: Oropharynx is clear. Uvula midline.  Eyes:     General: Lids are normal.     Extraocular Movements: Extraocular movements intact.     Conjunctiva/sclera: Conjunctivae normal.     Pupils:  Pupils are equal, round, and reactive to light.  Neck:     Musculoskeletal: Normal range of motion and neck supple.     Thyroid: No thyroid mass, thyromegaly or thyroid tenderness.     Vascular: No carotid bruit or JVD.     Trachea: Trachea and phonation normal.  Cardiovascular:     Rate and Rhythm: Normal rate and regular rhythm.     Chest Wall: PMI is not displaced.     Pulses: Normal pulses.     Heart sounds: Normal heart sounds. No murmur. No friction rub. No gallop.   Pulmonary:     Effort: Pulmonary effort is normal. No respiratory distress.     Breath sounds: Normal breath sounds. No wheezing.  Abdominal:     General: Bowel sounds are normal. There is no distension or abdominal bruit.     Palpations: Abdomen is soft. There is no hepatomegaly or splenomegaly.     Tenderness: There is no abdominal tenderness. There is no right CVA tenderness or left CVA tenderness.     Hernia: No hernia is present.  Musculoskeletal: Normal range of motion.     Right lower leg: No edema.     Left lower leg: No edema.  Lymphadenopathy:     Cervical: No cervical adenopathy.  Skin:    General: Skin is warm and dry.     Capillary Refill: Capillary refill takes less than 2 seconds.     Coloration: Skin is not cyanotic, jaundiced or pale.     Findings: No rash.  Neurological:     General: No focal deficit present.     Mental Status: She is alert and oriented to person, place, and time.     Cranial Nerves: Cranial nerves are intact.     Sensory: Sensation is intact.     Motor: Motor function is intact.     Coordination: Coordination is intact.     Gait:  Gait is intact.     Deep Tendon Reflexes: Reflexes are normal and symmetric.  Psychiatric:        Attention and Perception: Attention and perception normal.        Mood and Affect: Mood and affect normal.        Speech: Speech normal.        Behavior: Behavior normal. Behavior is cooperative.        Thought Content: Thought content normal.         Cognition and Memory: Cognition and memory normal.        Judgment: Judgment normal.     Results for orders placed or performed in visit on 05/07/19  Bayer DCA Hb A1c Waived  Result Value Ref Range   HB A1C (BAYER DCA - WAIVED) 8.0 (H) <7.0 %       Pertinent labs & imaging results that were available during my care of the patient were reviewed by me and considered in my medical decision making.  Assessment & Plan:  Holly Rodriguez was seen today for diabetes.  Diagnoses and all orders for this visit:  Type 2 diabetes mellitus with hyperglycemia, without long-term current use of insulin (HCC) A1C 8 in office today. Pt hesitant to adjust medications today. Would like to see an endocrinologist and adjust her diet prior to making changes. Long discussion about the importance of blood sugar control and long term end organ damage associated with continued elevated blood sugars. Diet and exercise discussed in detail. Will refer to endocrinology. Pt aware that if she can not see endo in the next three weeks, I will add on additional medication. Pt agrees with this plan.  -     Bayer DCA Hb A1c Waived -     CMP14+EGFR -     Ambulatory referral to Endocrinology  Hypertension associated with diabetes ([place]) Well controlled. Labs pending. Diet and exercise encouraged.  -     CBC with Differential/Platelet -     CMP14+EGFR  Hyperlipidemia associated with type 2 diabetes mellitus ([place]) Labs pending. Diet and exercise encouraged. Continue current medications.  -     Lipid panel  Thyroid disease Labs pending. Will adjust therapy if warranted.  -     Thyroid Panel With TSH     Continue all other maintenance medications.  Follow up plan: Return in about 3 months (around 08/07/2019), or if symptoms worsen or fail to improve, for DM.  Educational handout given for nutrition for diabetes  The above assessment and management plan was discussed with the patient. The patient verbalized understanding  of and has agreed to the management plan. Patient is aware to call the clinic if symptoms persist or worsen. Patient is aware when to return to the clinic for a follow-up visit. Patient educated on when it is appropriate to go to the emergency department.   Monia Pouch, FNP-C [facility] [phone]

## 2019-05-07 NOTE — Patient Instructions (Signed)
Diabetes Mellitus and Nutrition, Adult  When you have diabetes (diabetes mellitus), it is very important to have healthy eating habits because your blood sugar (glucose) levels are greatly affected by what you eat and drink. Eating healthy foods in the appropriate amounts, at about the same times every day, can help you:  · Control your blood glucose.  · Lower your risk of heart disease.  · Improve your blood pressure.  · Reach or maintain a healthy weight.  Every person with diabetes is different, and each person has different needs for a meal plan. Your health care provider may recommend that you work with a diet and nutrition specialist (dietitian) to make a meal plan that is best for you. Your meal plan may vary depending on factors such as:  · The calories you need.  · The medicines you take.  · Your weight.  · Your blood glucose, blood pressure, and cholesterol levels.  · Your activity level.  · Other health conditions you have, such as heart or kidney disease.  How do carbohydrates affect me?  Carbohydrates, also called carbs, affect your blood glucose level more than any other type of food. Eating carbs naturally raises the amount of glucose in your blood. Carb counting is a method for keeping track of how many carbs you eat. Counting carbs is important to keep your blood glucose at a healthy level, especially if you use insulin or take certain oral diabetes medicines.  It is important to know how many carbs you can safely have in each meal. This is different for every person. Your dietitian can help you calculate how many carbs you should have at each meal and for each snack.  Foods that contain carbs include:  · Bread, cereal, rice, pasta, and crackers.  · Potatoes and corn.  · Peas, beans, and lentils.  · Milk and yogurt.  · Fruit and juice.  · Desserts, such as cakes, cookies, ice cream, and candy.  How does alcohol affect me?  Alcohol can cause a sudden decrease in blood glucose (hypoglycemia),  especially if you use insulin or take certain oral diabetes medicines. Hypoglycemia can be a life-threatening condition. Symptoms of hypoglycemia (sleepiness, dizziness, and confusion) are similar to symptoms of having too much alcohol.  If your health care provider says that alcohol is safe for you, follow these guidelines:  · Limit alcohol intake to no more than 1 drink per day for nonpregnant women and 2 drinks per day for men. One drink equals 12 oz of beer, 5 oz of wine, or 1½ oz of hard liquor.  · Do not drink on an empty stomach.  · Keep yourself hydrated with water, diet soda, or unsweetened iced tea.  · Keep in mind that regular soda, juice, and other mixers may contain a lot of sugar and must be counted as carbs.  What are tips for following this plan?    Reading food labels  · Start by checking the serving size on the "Nutrition Facts" label of packaged foods and drinks. The amount of calories, carbs, fats, and other nutrients listed on the label is based on one serving of the item. Many items contain more than one serving per package.  · Check the total grams (g) of carbs in one serving. You can calculate the number of servings of carbs in one serving by dividing the total carbs by 15. For example, if a food has 30 g of total carbs, it would be equal to 2   servings of carbs.  · Check the number of grams (g) of saturated and trans fats in one serving. Choose foods that have low or no amount of these fats.  · Check the number of milligrams (mg) of salt (sodium) in one serving. Most people should limit total sodium intake to less than 2,300 mg per day.  · Always check the nutrition information of foods labeled as "low-fat" or "nonfat". These foods may be higher in added sugar or refined carbs and should be avoided.  · Talk to your dietitian to identify your daily goals for nutrients listed on the label.  Shopping  · Avoid buying canned, premade, or processed foods. These foods tend to be high in fat, sodium,  and added sugar.  · Shop around the outside edge of the grocery store. This includes fresh fruits and vegetables, bulk grains, fresh meats, and fresh dairy.  Cooking  · Use low-heat cooking methods, such as baking, instead of high-heat cooking methods like deep frying.  · Cook using healthy oils, such as olive, canola, or sunflower oil.  · Avoid cooking with butter, cream, or high-fat meats.  Meal planning  · Eat meals and snacks regularly, preferably at the same times every day. Avoid going long periods of time without eating.  · Eat foods high in fiber, such as fresh fruits, vegetables, beans, and whole grains. Talk to your dietitian about how many servings of carbs you can eat at each meal.  · Eat 4-6 ounces (oz) of lean protein each day, such as lean meat, chicken, fish, eggs, or tofu. One oz of lean protein is equal to:  ? 1 oz of meat, chicken, or fish.  ? 1 egg.  ? ¼ cup of tofu.  · Eat some foods each day that contain healthy fats, such as avocado, nuts, seeds, and fish.  Lifestyle  · Check your blood glucose regularly.  · Exercise regularly as told by your health care provider. This may include:  ? 150 minutes of moderate-intensity or vigorous-intensity exercise each week. This could be brisk walking, biking, or water aerobics.  ? Stretching and doing strength exercises, such as yoga or weightlifting, at least 2 times a week.  · Take medicines as told by your health care provider.  · Do not use any products that contain nicotine or tobacco, such as cigarettes and e-cigarettes. If you need help quitting, ask your health care provider.  · Work with a counselor or diabetes educator to identify strategies to manage stress and any emotional and social challenges.  Questions to ask a health care provider  · Do I need to meet with a diabetes educator?  · Do I need to meet with a dietitian?  · What number can I call if I have questions?  · When are the best times to check my blood glucose?  Where to find more  information:  · American Diabetes Association: diabetes.org  · Academy of Nutrition and Dietetics: www.eatright.org  · National Institute of Diabetes and Digestive and Kidney Diseases (NIH): www.niddk.nih.gov  Summary  · A healthy meal plan will help you control your blood glucose and maintain a healthy lifestyle.  · Working with a diet and nutrition specialist (dietitian) can help you make a meal plan that is best for you.  · Keep in mind that carbohydrates (carbs) and alcohol have immediate effects on your blood glucose levels. It is important to count carbs and to use alcohol carefully.  This information is not intended to   replace advice given to you by your health care provider. Make sure you discuss any questions you have with your health care provider.  Document Released: 08/01/2005 Document Revised: 06/04/2017 Document Reviewed: 12/09/2016  Elsevier Interactive Patient Education © 2019 Elsevier Inc.

## 2019-05-08 ENCOUNTER — Encounter: Payer: Self-pay | Admitting: Family Medicine

## 2019-05-08 DIAGNOSIS — E1122 Type 2 diabetes mellitus with diabetic chronic kidney disease: Secondary | ICD-10-CM | POA: Insufficient documentation

## 2019-05-08 DIAGNOSIS — N183 Chronic kidney disease, stage 3 unspecified: Secondary | ICD-10-CM | POA: Insufficient documentation

## 2019-05-08 LAB — CBC WITH DIFFERENTIAL/PLATELET
Basophils Absolute: 0.1 10*3/uL (ref 0.0–0.2)
Basos: 1 %
EOS (ABSOLUTE): 0.2 10*3/uL (ref 0.0–0.4)
Eos: 2 %
Hematocrit: 38.4 % (ref 34.0–46.6)
Hemoglobin: 12.5 g/dL (ref 11.1–15.9)
Immature Grans (Abs): 0.1 10*3/uL (ref 0.0–0.1)
Immature Granulocytes: 1 %
Lymphocytes Absolute: 4.7 10*3/uL — ABNORMAL HIGH (ref 0.7–3.1)
Lymphs: 41 %
MCH: 26.7 pg (ref 26.6–33.0)
MCHC: 32.6 g/dL (ref 31.5–35.7)
MCV: 82 fL (ref 79–97)
Monocytes Absolute: 0.7 10*3/uL (ref 0.1–0.9)
Monocytes: 6 %
Neutrophils Absolute: 5.7 10*3/uL (ref 1.4–7.0)
Neutrophils: 49 %
Platelets: 349 10*3/uL (ref 150–450)
RBC: 4.68 x10E6/uL (ref 3.77–5.28)
RDW: 15.6 % — ABNORMAL HIGH (ref 11.7–15.4)
WBC: 11.5 10*3/uL — ABNORMAL HIGH (ref 3.4–10.8)

## 2019-05-08 LAB — CMP14+EGFR
ALT: 36 IU/L — ABNORMAL HIGH (ref 0–32)
AST: 48 IU/L — ABNORMAL HIGH (ref 0–40)
Albumin/Globulin Ratio: 1.5 (ref 1.2–2.2)
Albumin: 4.4 g/dL (ref 3.8–4.9)
Alkaline Phosphatase: 70 IU/L (ref 39–117)
BUN/Creatinine Ratio: 30 — ABNORMAL HIGH (ref 9–23)
BUN: 48 mg/dL — ABNORMAL HIGH (ref 6–24)
Bilirubin Total: 0.2 mg/dL (ref 0.0–1.2)
CO2: 19 mmol/L — ABNORMAL LOW (ref 20–29)
Calcium: 9.4 mg/dL (ref 8.7–10.2)
Chloride: 99 mmol/L (ref 96–106)
Creatinine, Ser: 1.61 mg/dL — ABNORMAL HIGH (ref 0.57–1.00)
GFR calc Af Amer: 42 mL/min/{1.73_m2} — ABNORMAL LOW (ref >59)
GFR calc non Af Amer: 36 mL/min/{1.73_m2} — ABNORMAL LOW (ref >59)
Globulin, Total: 3 g/dL (ref 1.5–4.5)
Glucose: 119 mg/dL — ABNORMAL HIGH (ref 65–99)
Potassium: 4.5 mmol/L (ref 3.5–5.2)
Sodium: 136 mmol/L (ref 134–144)
Total Protein: 7.4 g/dL (ref 6.0–8.5)

## 2019-05-08 LAB — LIPID PANEL
Chol/HDL Ratio: 4.9 ratio — ABNORMAL HIGH (ref 0.0–4.4)
Cholesterol, Total: 187 mg/dL (ref 100–199)
HDL: 38 mg/dL — ABNORMAL LOW (ref >39)
Triglycerides: 434 mg/dL — ABNORMAL HIGH (ref 0–149)

## 2019-05-08 LAB — THYROID PANEL WITH TSH
Free Thyroxine Index: 3 (ref 1.2–4.9)
T3 Uptake Ratio: 32 % (ref 24–39)
T4, Total: 9.4 ug/dL (ref 4.5–12.0)
TSH: 2.49 u[IU]/mL (ref 0.450–4.500)

## 2019-05-08 MED ORDER — PRAVASTATIN SODIUM 20 MG PO TABS: 20.0000 mg | ORAL_TABLET | Freq: Every day | ORAL | 3 refills | Status: DC

## 2019-05-08 NOTE — Addendum Note (Signed)
Addended by: Baruch Gouty on: 05/08/2019 01:07 PM   Modules accepted: Orders

## 2019-05-10 ENCOUNTER — Encounter: Payer: Self-pay | Admitting: Family Medicine

## 2019-05-10 ENCOUNTER — Other Ambulatory Visit: Payer: Self-pay | Admitting: Family Medicine

## 2019-05-10 DIAGNOSIS — E119 Type 2 diabetes mellitus without complications: Secondary | ICD-10-CM

## 2019-05-10 MED ORDER — OZEMPIC (0.25 OR 0.5 MG/DOSE) 2 MG/1.5ML ~~LOC~~ SOPN: 0.5000 mg | PEN_INJECTOR | SUBCUTANEOUS | 3 refills | Status: DC

## 2019-05-10 MED ORDER — OZEMPIC (0.25 OR 0.5 MG/DOSE) 2 MG/1.5ML ~~LOC~~ SOPN: 0.2500 mg | PEN_INJECTOR | SUBCUTANEOUS | 0 refills | Status: AC

## 2019-05-11 ENCOUNTER — Encounter: Payer: Self-pay | Admitting: Family Medicine

## 2019-05-17 ENCOUNTER — Encounter: Payer: Self-pay | Admitting: Family Medicine

## 2019-05-18 ENCOUNTER — Ambulatory Visit: Payer: BLUE CROSS/BLUE SHIELD | Admitting: Family Medicine

## 2019-05-20 ENCOUNTER — Ambulatory Visit: Payer: BC Managed Care – PPO | Admitting: Family Medicine

## 2019-05-31 ENCOUNTER — Other Ambulatory Visit: Payer: Self-pay | Admitting: Family Medicine

## 2019-06-09 ENCOUNTER — Other Ambulatory Visit: Payer: Self-pay | Admitting: Nurse Practitioner

## 2019-06-16 ENCOUNTER — Other Ambulatory Visit: Payer: Self-pay | Admitting: Family Medicine

## 2019-06-24 ENCOUNTER — Encounter: Payer: Self-pay | Admitting: Family Medicine

## 2019-07-15 ENCOUNTER — Encounter: Payer: Self-pay | Admitting: Family Medicine

## 2019-07-15 DIAGNOSIS — E1165 Type 2 diabetes mellitus with hyperglycemia: Secondary | ICD-10-CM | POA: Diagnosis not present

## 2019-07-15 DIAGNOSIS — Z6841 Body Mass Index (BMI) 40.0 and over, adult: Secondary | ICD-10-CM | POA: Diagnosis not present

## 2019-07-15 DIAGNOSIS — E1122 Type 2 diabetes mellitus with diabetic chronic kidney disease: Secondary | ICD-10-CM | POA: Diagnosis not present

## 2019-07-15 DIAGNOSIS — N183 Chronic kidney disease, stage 3 (moderate): Secondary | ICD-10-CM | POA: Diagnosis not present

## 2019-07-22 DIAGNOSIS — E119 Type 2 diabetes mellitus without complications: Secondary | ICD-10-CM | POA: Diagnosis not present

## 2019-07-28 DIAGNOSIS — G4733 Obstructive sleep apnea (adult) (pediatric): Secondary | ICD-10-CM | POA: Diagnosis not present

## 2019-08-03 DIAGNOSIS — E119 Type 2 diabetes mellitus without complications: Secondary | ICD-10-CM | POA: Diagnosis not present

## 2019-08-12 ENCOUNTER — Ambulatory Visit: Payer: BC Managed Care – PPO | Admitting: Family Medicine

## 2019-08-13 ENCOUNTER — Other Ambulatory Visit: Payer: Self-pay | Admitting: Family Medicine

## 2019-08-18 DIAGNOSIS — N950 Postmenopausal bleeding: Secondary | ICD-10-CM | POA: Diagnosis not present

## 2019-08-20 ENCOUNTER — Encounter: Payer: Self-pay | Admitting: Family Medicine

## 2019-08-23 ENCOUNTER — Other Ambulatory Visit: Payer: Self-pay | Admitting: Family Medicine

## 2019-08-23 DIAGNOSIS — E1122 Type 2 diabetes mellitus with diabetic chronic kidney disease: Secondary | ICD-10-CM

## 2019-08-23 DIAGNOSIS — I152 Hypertension secondary to endocrine disorders: Secondary | ICD-10-CM

## 2019-08-23 DIAGNOSIS — E611 Iron deficiency: Secondary | ICD-10-CM

## 2019-08-23 DIAGNOSIS — D72829 Elevated white blood cell count, unspecified: Secondary | ICD-10-CM

## 2019-08-23 DIAGNOSIS — E119 Type 2 diabetes mellitus without complications: Secondary | ICD-10-CM

## 2019-08-23 DIAGNOSIS — E1159 Type 2 diabetes mellitus with other circulatory complications: Secondary | ICD-10-CM

## 2019-08-23 DIAGNOSIS — N183 Chronic kidney disease, stage 3 unspecified: Secondary | ICD-10-CM

## 2019-08-27 ENCOUNTER — Ambulatory Visit: Payer: BC Managed Care – PPO | Admitting: Family Medicine

## 2019-09-02 ENCOUNTER — Other Ambulatory Visit: Payer: Self-pay

## 2019-09-02 ENCOUNTER — Other Ambulatory Visit: Payer: BC Managed Care – PPO

## 2019-09-02 DIAGNOSIS — N183 Chronic kidney disease, stage 3 unspecified: Secondary | ICD-10-CM

## 2019-09-02 DIAGNOSIS — I152 Hypertension secondary to endocrine disorders: Secondary | ICD-10-CM

## 2019-09-02 DIAGNOSIS — E611 Iron deficiency: Secondary | ICD-10-CM

## 2019-09-02 DIAGNOSIS — E1122 Type 2 diabetes mellitus with diabetic chronic kidney disease: Secondary | ICD-10-CM

## 2019-09-02 DIAGNOSIS — E1159 Type 2 diabetes mellitus with other circulatory complications: Secondary | ICD-10-CM

## 2019-09-02 DIAGNOSIS — E119 Type 2 diabetes mellitus without complications: Secondary | ICD-10-CM

## 2019-09-02 DIAGNOSIS — D72829 Elevated white blood cell count, unspecified: Secondary | ICD-10-CM

## 2019-09-02 LAB — CBC WITH DIFFERENTIAL/PLATELET
Basophils Absolute: 0.1 10*3/uL (ref 0.0–0.2)
Basos: 1 %
EOS (ABSOLUTE): 0.3 10*3/uL (ref 0.0–0.4)
Eos: 3 %
Hematocrit: 39.1 % (ref 34.0–46.6)
Hemoglobin: 12.7 g/dL (ref 11.1–15.9)
Immature Grans (Abs): 0 10*3/uL (ref 0.0–0.1)
Immature Granulocytes: 0 %
Lymphocytes Absolute: 3.8 10*3/uL — ABNORMAL HIGH (ref 0.7–3.1)
Lymphs: 34 %
MCH: 28.6 pg (ref 26.6–33.0)
MCHC: 32.5 g/dL (ref 31.5–35.7)
MCV: 88 fL (ref 79–97)
Monocytes Absolute: 0.6 10*3/uL (ref 0.1–0.9)
Monocytes: 6 %
Neutrophils Absolute: 6.4 10*3/uL (ref 1.4–7.0)
Neutrophils: 56 %
Platelets: 344 10*3/uL (ref 150–450)
RBC: 4.44 x10E6/uL (ref 3.77–5.28)
RDW: 14.7 % (ref 11.7–15.4)
WBC: 11.2 10*3/uL — ABNORMAL HIGH (ref 3.4–10.8)

## 2019-09-02 LAB — CMP14+EGFR
ALT: 23 IU/L (ref 0–32)
AST: 29 IU/L (ref 0–40)
Albumin/Globulin Ratio: 1.7 (ref 1.2–2.2)
Albumin: 4.4 g/dL (ref 3.8–4.9)
Alkaline Phosphatase: 75 IU/L (ref 39–117)
BUN/Creatinine Ratio: 22 (ref 9–23)
BUN: 33 mg/dL — ABNORMAL HIGH (ref 6–24)
Bilirubin Total: 0.2 mg/dL (ref 0.0–1.2)
CO2: 17 mmol/L — ABNORMAL LOW (ref 20–29)
Calcium: 9.8 mg/dL (ref 8.7–10.2)
Chloride: 108 mmol/L — ABNORMAL HIGH (ref 96–106)
Creatinine, Ser: 1.51 mg/dL — ABNORMAL HIGH (ref 0.57–1.00)
GFR calc Af Amer: 45 mL/min/{1.73_m2} — ABNORMAL LOW (ref >59)
GFR calc non Af Amer: 39 mL/min/{1.73_m2} — ABNORMAL LOW (ref >59)
Globulin, Total: 2.6 g/dL (ref 1.5–4.5)
Glucose: 105 mg/dL — ABNORMAL HIGH (ref 65–99)
Potassium: 5.2 mmol/L (ref 3.5–5.2)
Sodium: 139 mmol/L (ref 134–144)
Total Protein: 7 g/dL (ref 6.0–8.5)

## 2019-09-02 LAB — BAYER DCA HB A1C WAIVED: HB A1C (BAYER DCA - WAIVED): 6.2 % (ref <7.0)

## 2019-09-03 DIAGNOSIS — N950 Postmenopausal bleeding: Secondary | ICD-10-CM | POA: Diagnosis not present

## 2019-09-25 ENCOUNTER — Other Ambulatory Visit: Payer: Self-pay | Admitting: Family Medicine

## 2019-09-25 DIAGNOSIS — E119 Type 2 diabetes mellitus without complications: Secondary | ICD-10-CM

## 2019-09-29 DIAGNOSIS — N939 Abnormal uterine and vaginal bleeding, unspecified: Secondary | ICD-10-CM | POA: Diagnosis not present

## 2019-09-29 DIAGNOSIS — R300 Dysuria: Secondary | ICD-10-CM | POA: Diagnosis not present

## 2019-09-29 DIAGNOSIS — Z5309 Procedure and treatment not carried out because of other contraindication: Secondary | ICD-10-CM | POA: Diagnosis not present

## 2019-10-04 ENCOUNTER — Other Ambulatory Visit: Payer: Self-pay | Admitting: Obstetrics and Gynecology

## 2019-11-06 ENCOUNTER — Other Ambulatory Visit: Payer: Self-pay | Admitting: Physician Assistant

## 2019-11-06 DIAGNOSIS — L03213 Periorbital cellulitis: Secondary | ICD-10-CM

## 2019-11-06 HISTORY — DX: Periorbital cellulitis: L03.213

## 2019-11-06 MED ORDER — AMOXICILLIN 875 MG PO TABS: 875.0000 mg | ORAL_TABLET | Freq: Two times a day (BID) | ORAL | 0 refills | Status: DC

## 2019-11-06 MED ORDER — SULFAMETHOXAZOLE-TRIMETHOPRIM 800-160 MG PO TABS: 1.0000 | ORAL_TABLET | Freq: Two times a day (BID) | ORAL | 0 refills | Status: DC

## 2019-11-06 NOTE — Progress Notes (Unsigned)
This patient had a phone call visit this morning.  She had had 1 day of her eye feeling tender and woke up this morning with it swollen at the upper eyelid and lower eyelid and having pressure.  It feels a little painful there is not any copious drainage or painful blinking that she had experienced with previous pink eyes.  She does not wear contacts.  She does not know of any exposure contacts.  She checked her temperature while she was on the phone with me and it was 97.  We have discussed that this could be preseptal cellulitis and therefore I am going to send in amoxicillin 875 mg 1 twice daily to take with Bactrim DS 1 twice daily.  She is given instructions that if anything changes or gets worse to let us know.  Terald Sleeper PA-C [facility] [address]  [place], [place] [address] [phone]

## 2019-11-08 ENCOUNTER — Encounter: Payer: Self-pay | Admitting: Family Medicine

## 2019-11-11 ENCOUNTER — Other Ambulatory Visit: Payer: Self-pay | Admitting: Family Medicine

## 2019-11-18 ENCOUNTER — Encounter (HOSPITAL_BASED_OUTPATIENT_CLINIC_OR_DEPARTMENT_OTHER): Payer: Self-pay | Admitting: Obstetrics and Gynecology

## 2019-11-18 ENCOUNTER — Other Ambulatory Visit: Payer: Self-pay

## 2019-11-18 NOTE — Progress Notes (Signed)
Spoke with: Yarlin NPO:  After Midnight, no gum, candy, or mints   Arrival time: 0945 AM Labs: CBC, CMP, EKG  AM medications: Allopurinol, Fenofibrate, Levothyroxine, Omeprazole Pre op orders: Yes Ride home: KENNY ([place]) [phone] Instructed to bring CPAP machine day of surgery

## 2019-11-22 ENCOUNTER — Other Ambulatory Visit (HOSPITAL_COMMUNITY)
Admission: RE | Admit: 2019-11-22 | Discharge: 2019-11-22 | Disposition: A | Discharge status: Home / Self Care | Payer: BC Managed Care – PPO | Source: Ambulatory Visit | Attending: Obstetrics and Gynecology | Admitting: Obstetrics and Gynecology

## 2019-11-22 ENCOUNTER — Other Ambulatory Visit: Payer: Self-pay

## 2019-11-22 ENCOUNTER — Encounter (HOSPITAL_COMMUNITY)
Admission: RE | Admit: 2019-11-22 | Discharge: 2019-11-22 | Disposition: A | Discharge status: Home / Self Care | Payer: BC Managed Care – PPO | Source: Ambulatory Visit | Attending: Obstetrics and Gynecology | Admitting: Obstetrics and Gynecology

## 2019-11-22 DIAGNOSIS — N183 Chronic kidney disease, stage 3 unspecified: Secondary | ICD-10-CM | POA: Diagnosis not present

## 2019-11-22 DIAGNOSIS — Z87891 Personal history of nicotine dependence: Secondary | ICD-10-CM | POA: Diagnosis not present

## 2019-11-22 DIAGNOSIS — Z20822 Contact with and (suspected) exposure to covid-19: Secondary | ICD-10-CM | POA: Diagnosis not present

## 2019-11-22 DIAGNOSIS — K219 Gastro-esophageal reflux disease without esophagitis: Secondary | ICD-10-CM | POA: Diagnosis not present

## 2019-11-22 DIAGNOSIS — D250 Submucous leiomyoma of uterus: Secondary | ICD-10-CM | POA: Diagnosis not present

## 2019-11-22 DIAGNOSIS — Z79890 Hormone replacement therapy (postmenopausal): Secondary | ICD-10-CM | POA: Diagnosis not present

## 2019-11-22 DIAGNOSIS — I10 Essential (primary) hypertension: Secondary | ICD-10-CM | POA: Diagnosis not present

## 2019-11-22 DIAGNOSIS — E785 Hyperlipidemia, unspecified: Secondary | ICD-10-CM | POA: Diagnosis not present

## 2019-11-22 DIAGNOSIS — E669 Obesity, unspecified: Secondary | ICD-10-CM | POA: Diagnosis not present

## 2019-11-22 DIAGNOSIS — G4730 Sleep apnea, unspecified: Secondary | ICD-10-CM | POA: Diagnosis not present

## 2019-11-22 DIAGNOSIS — E1122 Type 2 diabetes mellitus with diabetic chronic kidney disease: Secondary | ICD-10-CM | POA: Diagnosis not present

## 2019-11-22 DIAGNOSIS — F419 Anxiety disorder, unspecified: Secondary | ICD-10-CM | POA: Diagnosis not present

## 2019-11-22 DIAGNOSIS — Z85828 Personal history of other malignant neoplasm of skin: Secondary | ICD-10-CM | POA: Diagnosis not present

## 2019-11-22 DIAGNOSIS — Z01812 Encounter for preprocedural laboratory examination: Secondary | ICD-10-CM | POA: Insufficient documentation

## 2019-11-22 DIAGNOSIS — M109 Gout, unspecified: Secondary | ICD-10-CM | POA: Diagnosis not present

## 2019-11-22 DIAGNOSIS — E039 Hypothyroidism, unspecified: Secondary | ICD-10-CM | POA: Diagnosis not present

## 2019-11-22 DIAGNOSIS — Z6841 Body Mass Index (BMI) 40.0 and over, adult: Secondary | ICD-10-CM | POA: Diagnosis not present

## 2019-11-22 DIAGNOSIS — Z9884 Bariatric surgery status: Secondary | ICD-10-CM | POA: Diagnosis not present

## 2019-11-22 DIAGNOSIS — N840 Polyp of corpus uteri: Secondary | ICD-10-CM | POA: Diagnosis not present

## 2019-11-22 DIAGNOSIS — Z794 Long term (current) use of insulin: Secondary | ICD-10-CM | POA: Diagnosis not present

## 2019-11-22 DIAGNOSIS — N950 Postmenopausal bleeding: Secondary | ICD-10-CM | POA: Diagnosis not present

## 2019-11-22 LAB — COMPREHENSIVE METABOLIC PANEL
ALT: 19 U/L (ref 0–44)
AST: 19 U/L (ref 15–41)
Albumin: 4.3 g/dL (ref 3.5–5.0)
Alkaline Phosphatase: 59 U/L (ref 38–126)
Anion gap: 9 (ref 5–15)
BUN: 32 mg/dL — ABNORMAL HIGH (ref 6–20)
CO2: 24 mmol/L (ref 22–32)
Calcium: 9.1 mg/dL (ref 8.9–10.3)
Chloride: 105 mmol/L (ref 98–111)
Creatinine, Ser: 1.5 mg/dL — ABNORMAL HIGH (ref 0.44–1.00)
GFR calc Af Amer: 46 mL/min — ABNORMAL LOW (ref >60)
GFR calc non Af Amer: 39 mL/min — ABNORMAL LOW (ref >60)
Glucose, Bld: 109 mg/dL — ABNORMAL HIGH (ref 70–99)
Potassium: 5.1 mmol/L (ref 3.5–5.1)
Sodium: 138 mmol/L (ref 135–145)
Total Bilirubin: 0.4 mg/dL (ref 0.3–1.2)
Total Protein: 7.3 g/dL (ref 6.5–8.1)

## 2019-11-22 LAB — CBC
HCT: 38.1 % (ref 36.0–46.0)
Hemoglobin: 11.8 g/dL — ABNORMAL LOW (ref 12.0–15.0)
MCH: 28.7 pg (ref 26.0–34.0)
MCHC: 31 g/dL (ref 30.0–36.0)
MCV: 92.7 fL (ref 80.0–100.0)
Platelets: 319 10*3/uL (ref 150–400)
RBC: 4.11 MIL/uL (ref 3.87–5.11)
RDW: 15.2 % (ref 11.5–15.5)
WBC: 9.2 10*3/uL (ref 4.0–10.5)
nRBC: 0 % (ref 0.0–0.2)

## 2019-11-23 LAB — NOVEL CORONAVIRUS, NAA (HOSP ORDER, SEND-OUT TO REF LAB; TAT 18-24 HRS): SARS-CoV-2, NAA: NOT DETECTED

## 2019-11-24 NOTE — Progress Notes (Signed)
Confirmed EKG dated 11-23-2019 reviewed by anesthesia, Konrad Felix PA,  Ok to proceed.

## 2019-11-25 ENCOUNTER — Ambulatory Visit (HOSPITAL_BASED_OUTPATIENT_CLINIC_OR_DEPARTMENT_OTHER): Payer: BC Managed Care – PPO | Admitting: Physician Assistant

## 2019-11-25 ENCOUNTER — Other Ambulatory Visit: Payer: Self-pay

## 2019-11-25 ENCOUNTER — Encounter (HOSPITAL_BASED_OUTPATIENT_CLINIC_OR_DEPARTMENT_OTHER)
Admission: RE | Disposition: A | Discharge status: Home / Self Care | Payer: Self-pay | Source: Home / Self Care | Attending: Obstetrics and Gynecology

## 2019-11-25 ENCOUNTER — Encounter (HOSPITAL_BASED_OUTPATIENT_CLINIC_OR_DEPARTMENT_OTHER): Payer: Self-pay | Admitting: Obstetrics and Gynecology

## 2019-11-25 ENCOUNTER — Ambulatory Visit (HOSPITAL_BASED_OUTPATIENT_CLINIC_OR_DEPARTMENT_OTHER): Payer: BC Managed Care – PPO | Admitting: Anesthesiology

## 2019-11-25 ENCOUNTER — Ambulatory Visit (HOSPITAL_BASED_OUTPATIENT_CLINIC_OR_DEPARTMENT_OTHER)
Admission: RE | Admit: 2019-11-25 | Discharge: 2019-11-25 | Disposition: A | Discharge status: Home / Self Care | Payer: BC Managed Care – PPO | Attending: Obstetrics and Gynecology | Admitting: Obstetrics and Gynecology

## 2019-11-25 DIAGNOSIS — E781 Pure hyperglyceridemia: Secondary | ICD-10-CM | POA: Diagnosis not present

## 2019-11-25 DIAGNOSIS — Z6841 Body Mass Index (BMI) 40.0 and over, adult: Secondary | ICD-10-CM | POA: Insufficient documentation

## 2019-11-25 DIAGNOSIS — E669 Obesity, unspecified: Secondary | ICD-10-CM | POA: Diagnosis not present

## 2019-11-25 DIAGNOSIS — I10 Essential (primary) hypertension: Secondary | ICD-10-CM | POA: Insufficient documentation

## 2019-11-25 DIAGNOSIS — M109 Gout, unspecified: Secondary | ICD-10-CM | POA: Insufficient documentation

## 2019-11-25 DIAGNOSIS — K219 Gastro-esophageal reflux disease without esophagitis: Secondary | ICD-10-CM | POA: Insufficient documentation

## 2019-11-25 DIAGNOSIS — Z79890 Hormone replacement therapy (postmenopausal): Secondary | ICD-10-CM | POA: Insufficient documentation

## 2019-11-25 DIAGNOSIS — Z794 Long term (current) use of insulin: Secondary | ICD-10-CM | POA: Diagnosis not present

## 2019-11-25 DIAGNOSIS — N840 Polyp of corpus uteri: Secondary | ICD-10-CM | POA: Diagnosis not present

## 2019-11-25 DIAGNOSIS — D250 Submucous leiomyoma of uterus: Secondary | ICD-10-CM | POA: Insufficient documentation

## 2019-11-25 DIAGNOSIS — E785 Hyperlipidemia, unspecified: Secondary | ICD-10-CM | POA: Insufficient documentation

## 2019-11-25 DIAGNOSIS — G4730 Sleep apnea, unspecified: Secondary | ICD-10-CM | POA: Insufficient documentation

## 2019-11-25 DIAGNOSIS — Z85828 Personal history of other malignant neoplasm of skin: Secondary | ICD-10-CM | POA: Insufficient documentation

## 2019-11-25 DIAGNOSIS — E1122 Type 2 diabetes mellitus with diabetic chronic kidney disease: Secondary | ICD-10-CM | POA: Insufficient documentation

## 2019-11-25 DIAGNOSIS — N183 Chronic kidney disease, stage 3 unspecified: Secondary | ICD-10-CM | POA: Insufficient documentation

## 2019-11-25 DIAGNOSIS — E039 Hypothyroidism, unspecified: Secondary | ICD-10-CM | POA: Diagnosis not present

## 2019-11-25 DIAGNOSIS — N950 Postmenopausal bleeding: Secondary | ICD-10-CM | POA: Insufficient documentation

## 2019-11-25 DIAGNOSIS — Z87891 Personal history of nicotine dependence: Secondary | ICD-10-CM | POA: Insufficient documentation

## 2019-11-25 DIAGNOSIS — Z9884 Bariatric surgery status: Secondary | ICD-10-CM | POA: Insufficient documentation

## 2019-11-25 DIAGNOSIS — F419 Anxiety disorder, unspecified: Secondary | ICD-10-CM | POA: Diagnosis not present

## 2019-11-25 HISTORY — DX: Other intervertebral disc degeneration, lumbar region: M51.36

## 2019-11-25 HISTORY — DX: Hypothyroidism, unspecified: E03.9

## 2019-11-25 HISTORY — DX: Chronic kidney disease, stage 3 unspecified: N18.30

## 2019-11-25 HISTORY — DX: Unspecified malignant neoplasm of skin, unspecified: C44.90

## 2019-11-25 HISTORY — DX: Iron deficiency anemia, unspecified: D50.9

## 2019-11-25 HISTORY — DX: Essential (primary) hypertension: I10

## 2019-11-25 HISTORY — DX: Personal history of other diseases of the nervous system and sense organs: Z86.69

## 2019-11-25 HISTORY — DX: Family history of other specified conditions: Z84.89

## 2019-11-25 HISTORY — DX: Other intervertebral disc degeneration, lumbar region without mention of lumbar back pain or lower extremity pain: M51.369

## 2019-11-25 HISTORY — DX: Herpesviral infection, unspecified: B00.9

## 2019-11-25 HISTORY — DX: Postmenopausal bleeding: N95.0

## 2019-11-25 HISTORY — DX: Obesity, unspecified: E66.9

## 2019-11-25 HISTORY — DX: Pain in right hip: M25.551

## 2019-11-25 HISTORY — DX: Personal history of colon polyps, unspecified: Z86.0100

## 2019-11-25 HISTORY — DX: Fatty (change of) liver, not elsewhere classified: K76.0

## 2019-11-25 HISTORY — DX: Presence of spectacles and contact lenses: Z97.3

## 2019-11-25 HISTORY — DX: Polyp of corpus uteri: N84.0

## 2019-11-25 HISTORY — DX: Anesthesia of skin: R20.0

## 2019-11-25 HISTORY — DX: Paresthesia of skin: R20.0

## 2019-11-25 HISTORY — DX: Other seasonal allergic rhinitis: J30.2

## 2019-11-25 HISTORY — PX: DILATATION & CURETTAGE/HYSTEROSCOPY WITH MYOSURE: SHX6511

## 2019-11-25 HISTORY — DX: Hyperlipidemia, unspecified: E78.5

## 2019-11-25 HISTORY — DX: Polyneuropathy, unspecified: G62.9

## 2019-11-25 HISTORY — DX: Personal history of colonic polyps: Z86.010

## 2019-11-25 HISTORY — DX: Other intervertebral disc displacement, lumbar region: M51.26

## 2019-11-25 LAB — GLUCOSE, CAPILLARY
Glucose-Capillary: 104 mg/dL — ABNORMAL HIGH (ref 70–99)
Glucose-Capillary: 128 mg/dL — ABNORMAL HIGH (ref 70–99)

## 2019-11-25 SURGERY — DILATATION & CURETTAGE/HYSTEROSCOPY WITH MYOSURE
Anesthesia: General

## 2019-11-25 MED ORDER — KETOROLAC TROMETHAMINE 30 MG/ML IJ SOLN
INTRAMUSCULAR | Status: DC | PRN
  Administered 2019-11-25: 30 mg via INTRAVENOUS

## 2019-11-25 MED ORDER — TRAMADOL HCL 50 MG PO TABS: 50.0000 mg | ORAL_TABLET | Freq: Four times a day (QID) | ORAL | 0 refills | Status: DC | PRN

## 2019-11-25 MED ORDER — VASOPRESSIN 20 UNIT/ML IV SOLN
INTRAVENOUS | Status: DC | PRN
  Administered 2019-11-25: 20 [IU]

## 2019-11-25 MED ORDER — CEFAZOLIN SODIUM-DEXTROSE 2-4 GM/100ML-% IV SOLN
2.0000 g | INTRAVENOUS | Status: AC
  Administered 2019-11-25: 12:00:00 2 g via INTRAVENOUS
  Filled 2019-11-25: qty 100

## 2019-11-25 MED ORDER — LIDOCAINE 2% (20 MG/ML) 5 ML SYRINGE
INTRAMUSCULAR | Status: DC | PRN
  Administered 2019-11-25: 60 mg via INTRAVENOUS

## 2019-11-25 MED ORDER — FENTANYL CITRATE (PF) 100 MCG/2ML IJ SOLN
25.0000 ug | INTRAMUSCULAR | Status: DC | PRN
  Filled 2019-11-25: qty 1

## 2019-11-25 MED ORDER — ONDANSETRON HCL 4 MG/2ML IJ SOLN
INTRAMUSCULAR | Status: AC
  Filled 2019-11-25: qty 2

## 2019-11-25 MED ORDER — FENTANYL CITRATE (PF) 100 MCG/2ML IJ SOLN
INTRAMUSCULAR | Status: AC
  Filled 2019-11-25: qty 2

## 2019-11-25 MED ORDER — OXYCODONE HCL 5 MG/5ML PO SOLN
5.0000 mg | Freq: Once | ORAL | Status: AC | PRN
  Filled 2019-11-25: qty 5

## 2019-11-25 MED ORDER — DEXAMETHASONE SODIUM PHOSPHATE 10 MG/ML IJ SOLN
INTRAMUSCULAR | Status: DC | PRN
  Administered 2019-11-25 (×2): 5 mg via INTRAVENOUS

## 2019-11-25 MED ORDER — METOCLOPRAMIDE HCL 5 MG/ML IJ SOLN
10.0000 mg | Freq: Once | INTRAMUSCULAR | Status: DC | PRN
  Filled 2019-11-25: qty 2

## 2019-11-25 MED ORDER — MIDAZOLAM HCL 2 MG/2ML IJ SOLN
INTRAMUSCULAR | Status: DC | PRN
  Administered 2019-11-25: 2 mg via INTRAVENOUS

## 2019-11-25 MED ORDER — LACTATED RINGERS IV SOLN
INTRAVENOUS | Status: DC
  Filled 2019-11-25: qty 1000

## 2019-11-25 MED ORDER — MIDAZOLAM HCL 2 MG/2ML IJ SOLN
INTRAMUSCULAR | Status: AC
  Filled 2019-11-25: qty 2

## 2019-11-25 MED ORDER — LIDOCAINE 2% (20 MG/ML) 5 ML SYRINGE
INTRAMUSCULAR | Status: AC
  Filled 2019-11-25: qty 5

## 2019-11-25 MED ORDER — CEFAZOLIN SODIUM-DEXTROSE 2-4 GM/100ML-% IV SOLN
INTRAVENOUS | Status: AC
  Filled 2019-11-25: qty 100

## 2019-11-25 MED ORDER — SCOPOLAMINE 1 MG/3DAYS TD PT72
MEDICATED_PATCH | TRANSDERMAL | Status: AC
  Filled 2019-11-25: qty 1

## 2019-11-25 MED ORDER — KETOROLAC TROMETHAMINE 30 MG/ML IJ SOLN
INTRAMUSCULAR | Status: AC
  Filled 2019-11-25: qty 1

## 2019-11-25 MED ORDER — OXYCODONE HCL 5 MG PO TABS
5.0000 mg | ORAL_TABLET | Freq: Once | ORAL | Status: AC | PRN
  Administered 2019-11-25: 5 mg via ORAL
  Filled 2019-11-25: qty 1

## 2019-11-25 MED ORDER — PROPOFOL 10 MG/ML IV BOLUS
INTRAVENOUS | Status: DC | PRN
  Administered 2019-11-25: 150 mg via INTRAVENOUS

## 2019-11-25 MED ORDER — ONDANSETRON HCL 4 MG/2ML IJ SOLN
INTRAMUSCULAR | Status: DC | PRN
  Administered 2019-11-25: 4 mg via INTRAVENOUS

## 2019-11-25 MED ORDER — BUPIVACAINE HCL (PF) 0.25 % IJ SOLN
INTRAMUSCULAR | Status: DC | PRN
  Administered 2019-11-25: 20 mL

## 2019-11-25 MED ORDER — OXYCODONE HCL 5 MG PO TABS
ORAL_TABLET | ORAL | Status: AC
  Filled 2019-11-25: qty 1

## 2019-11-25 MED ORDER — FENTANYL CITRATE (PF) 100 MCG/2ML IJ SOLN
INTRAMUSCULAR | Status: DC | PRN
  Administered 2019-11-25 (×3): 25 ug via INTRAVENOUS
  Administered 2019-11-25: 50 ug via INTRAVENOUS

## 2019-11-25 MED ORDER — SCOPOLAMINE 1 MG/3DAYS TD PT72
1.0000 | MEDICATED_PATCH | TRANSDERMAL | Status: DC
  Administered 2019-11-25: 1.5 mg via TRANSDERMAL
  Filled 2019-11-25: qty 1

## 2019-11-25 MED ORDER — DEXAMETHASONE SODIUM PHOSPHATE 10 MG/ML IJ SOLN
INTRAMUSCULAR | Status: AC
  Filled 2019-11-25: qty 1

## 2019-11-25 SURGICAL SUPPLY — 16 items
CATH ROBINSON RED A/P 16FR (CATHETERS) ×2 IMPLANT
DECANTER SPIKE VIAL GLASS SM (MISCELLANEOUS) ×4 IMPLANT
DEVICE MYOSURE LITE (MISCELLANEOUS) IMPLANT
DEVICE MYOSURE REACH (MISCELLANEOUS) ×1 IMPLANT
GLOVE BIO SURGEON STRL SZ7.5 (GLOVE) ×2 IMPLANT
GLOVE BIOGEL PI IND STRL 7.0 (GLOVE) ×1 IMPLANT
GLOVE BIOGEL PI INDICATOR 7.0 (GLOVE) ×1
GOWN STRL REUS W/TWL LRG LVL3 (GOWN DISPOSABLE) ×4 IMPLANT
KIT PROCEDURE FLUENT (KITS) ×2 IMPLANT
NDL SPNL 22GX3.5 QUINCKE BK (NEEDLE) ×1 IMPLANT
NEEDLE SPNL 22GX3.5 QUINCKE BK (NEEDLE) ×2 IMPLANT
PACK VAGINAL MINOR WOMEN LF (CUSTOM PROCEDURE TRAY) ×2 IMPLANT
PAD OB MATERNITY 4.3X12.25 (PERSONAL CARE ITEMS) ×2 IMPLANT
SEAL CERVICAL OMNI LOK (ABLATOR) IMPLANT
SEAL ROD LENS SCOPE MYOSURE (ABLATOR) ×2 IMPLANT
TOWEL OR 17X26 10 PK STRL BLUE (TOWEL DISPOSABLE) ×4 IMPLANT

## 2019-11-25 NOTE — Op Note (Signed)
Holly Rodriguez, Holly Rodriguez MEDICAL RECORD [id number] ACCOUNT [id number] DATE OF BIRTH:1966/09/07 FACILITY: [facility] LOCATION: [facility] PHYSICIAN:Evamae Rowen J. Zayvier Caravello, MD  OPERATIVE REPORT  DATE OF PROCEDURE:  11/25/2019  PREOPERATIVE DIAGNOSIS:  Postmenopausal bleeding with structural lesion.  POSTOPERATIVE DIAGNOSIS:  Large endometrial polyp and submucous fibroids.  PROCEDURE:  Diagnostic hysteroscopy, dilatation and curettage, MyoSure resection of endometrial polyp and submucous fibroid.  SURGEON:  Brien Few, MD  ASSISTANT:  None.  ANESTHESIA:  General and local.  ESTIMATED BLOOD LOSS:  Less than 50 mL.  FLUID DEFICIT:  110 mL.  COMPLICATIONS:  None.  DRAINS:  None.  COUNTS:  Correct.  DISPOSITION:  The patient was taken to recovery in good condition.  BRIEF OPERATIVE NOTE:  After being apprised of the risks of anesthesia, infection, bleeding, and surrounding organs, possible need for repair, delayed versus immediate complications including bowel and bladder, internal vessel injury, possible need for  repair, inability to cure possible cancer and/or bleeding,  the patient was brought to the operating room where she was administered general anesthetic without complications.  Prepped and draped in usual sterile fashion, catheterized until the bladder  was empty.  Exam under anesthesia reveals an anteflexed uterus.  Speculum placed.  Tenaculum used.  A dilute paracervical block using 20 mL total.  Dilute Pitressin solution placed at 3 and 9 o'clock cervicovaginal junction.  Cervical stenosis was noted,  but dilated comfortably up to a 23 Pratt dilator.  Hysteroscope placed.  Visualization reveals a large posterior wall endometrial polyp and bilateral normal tubal ostia.  A small anterior submucous fibroid, a small posterior submucous fibroid and a  small sessile endometrial polyp at the left lateral cervical uterine border.  At this time commissure devices were placed and all  polyps and fibroids were removed in total down to the level of the base.  No bleeding noted.  Fluid deficit as noted.  D and  C performed using sharp curettage in a 4-quadrant method, normal cavity noted.  All instruments were removed.  Fluid deficit as noted.  The patient tolerated the procedure well, was awakened and transferred to recovery in good condition.  PN/NUANCE  D:11/25/2019 T:11/25/2019 JOB:[id number]

## 2019-11-25 NOTE — H&P (Signed)
Holly Rodriguez is an 54 y.o. female. PMB with structural lesion  Pertinent Gynecological History: Menses: post-menopausal Bleeding: post menopausal bleeding Contraception: none DES exposure: denies Blood transfusions: none Sexually transmitted diseases: no past history Previous GYN Procedures: DNC  Last mammogram: normal Date: 2020 Last pap: normal Date: 2020 OB History: G0, P0   Menstrual History: Menarche age: 37 Patient's last menstrual period was 06/29/2017.    Past Medical History:  Diagnosis Date  . Anxiety   . Bulging of intervertebral disc between L4 and L5   . CKD (chronic kidney disease), stage III    Due to Diabetes  . Diabetes mellitus without complication ([place])   . Family history of adverse reaction to anesthesia    mother post op nausea and vomiting  . Fatty liver   . GERD (gastroesophageal reflux disease)   . Gout   . Herpes    Cold sores  . History of appendicitis 2014  . History of colon polyps   . History of migraine   . Hyperlipidemia   . Hypertension   . Hypothyroidism   . IBS (irritable bowel syndrome)   . Iron deficiency anemia   . Numbness and tingling of both lower extremities    Right is greather than left  . Obesity   . Peripheral neuropathy   . Pneumonia 01/2016  . Postmenopausal bleeding   . Preseptal cellulitis of left eye 11/06/2019  . Right hip pain   . Seasonal allergies   . Skin cancer    left arm  . Sleep apnea   . Uterine polyp   . Wears glasses     Past Surgical History:  Procedure Laterality Date  . CESAREAN SECTION     x2  . CHOLECYSTECTOMY    . COLONOSCOPY  11/23/2018  . ESOPHAGEAL DILATION     Stomach dilation after gastric surgery  . GASTRIC RESTRICTION SURGERY     x3  . LAPAROSCOPIC APPENDECTOMY N/A 10/25/2013   Procedure: APPENDECTOMY LAPAROSCOPIC;  Surgeon: Jamesetta So, MD;  Location: [facility];  Service: General;  Laterality: N/A;  . SKIN CANCER EXCISION Left    Arm  . TONSILLECTOMY    . TUBAL  LIGATION    . UPPER GI ENDOSCOPY      Family History  Problem Relation Age of Onset  . Arthritis Mother   . Fibromyalgia Mother   . Hyperlipidemia Mother   . Arthritis Maternal Grandmother   . Heart disease Maternal Grandmother   . Alzheimer's disease Maternal Grandfather   . Gout Daughter   . Gout Son     Social History:  reports that she has quit smoking. Her smoking use included cigarettes. She quit after 2.00 years of use. She has never used smokeless tobacco. She reports current alcohol use. She reports that she does not use drugs.  Allergies: No Known Allergies  Medications Prior to Admission  Medication Sig Dispense Refill Last Dose  . allopurinol (ZYLOPRIM) 300 MG tablet TAKE 1 TABLET BY MOUTH  DAILY FOR GOUT PREVENTION 90 tablet 0 11/24/2019 at Unknown time  . ALPRAZolam (XANAX) 0.5 MG tablet Take 2 tablets (1 mg total) by mouth at bedtime as needed for anxiety. 90 tablet 1 Past Month at Unknown time  . amoxicillin (AMOXIL) 875 MG tablet Take 1 tablet (875 mg total) by mouth 2 (two) times daily. 20 tablet 0 Past Week at Unknown time  . cyclobenzaprine (FLEXERIL) 10 MG tablet Take 1 tablet (10 mg total) by mouth 3 (three) times  daily as needed for muscle spasms. 60 tablet 0 Past Week at Unknown time  . fenofibrate 160 MG tablet TAKE 1 TABLET BY MOUTH  EVERY DAY 90 tablet 1 11/24/2019 at Unknown time  . glucose blood test strip Reli-on glucometer.  Test Blood sugar qid Dx. 250.01   11/24/2019 at Unknown time  . Lancets (ONETOUCH ULTRASOFT) lancets Test 1X per day and prn   Dx 250.01 100 each 12 11/24/2019 at Unknown time  . levothyroxine (SYNTHROID, LEVOTHROID) 75 MCG tablet TAKE 1 TABLET BY MOUTH  DAILY BEFORE BREAKFAST 90 tablet 3 11/24/2019 at Unknown time  . lisinopril (ZESTRIL) 20 MG tablet TAKE 1 TABLET BY MOUTH  DAILY 90 tablet 0 11/24/2019 at Unknown time  . Omega-3 Fatty Acids (OMEGA 3 PO) Take 4 capsules by mouth daily.   Past Week at Unknown time  . omeprazole (PRILOSEC) 40 MG  capsule TAKE 1 CAPSULE BY MOUTH  DAILY 90 capsule 0 11/25/2019 at 0730  . OZEMPIC, 0.25 OR 0.5 MG/DOSE, 2 MG/1.5ML SOPN INJECT 0.5MG  SQ ONCE A WEEK 1.5 mL 2 11/20/2019 at Unknown time  . potassium chloride (K-DUR) 10 MEQ tablet TAKE 1 TABLET BY MOUTH  DAILY 90 tablet 3 11/24/2019 at Unknown time  . pravastatin (PRAVACHOL) 20 MG tablet Take 1 tablet (20 mg total) by mouth daily. (Patient taking differently: Take 20 mg by mouth 3 (three) times a week. ) 90 tablet 3 Past Week at Unknown time  . sulfamethoxazole-trimethoprim (BACTRIM DS) 800-160 MG tablet Take 1 tablet by mouth 2 (two) times daily. 20 tablet 0 Past Week at Unknown time  . SYNJARDY 12.03-999 MG TABS TAKE 1 TABLET BY MOUTH  TWICE DAILY (Patient taking differently: Most of the time its once daily) 180 tablet 0 11/23/2019 at Unknown time  . colchicine 0.6 MG tablet Take 1 tablet (0.6 mg total) by mouth 2 (two) times daily. Until symptoms clear. 90 tablet 1 More than a month at Unknown time  . JANUVIA 100 MG tablet TAKE ONE (1) TABLET EACH DAY (Patient not taking: Reported on 11/18/2019) 90 tablet 0 More than a month at Unknown time  . valACYclovir (VALTREX) 1000 MG tablet Take 1 tablet (1,000 mg total) daily by mouth. As directed (Patient taking differently: Take 1,000 mg by mouth as needed. As directed) 30 tablet 1 More than a month at Unknown time  . Vitamin D, Ergocalciferol, (DRISDOL) 1.25 MG (50000 UT) CAPS capsule Take one capsule by mouth every 7 days 13 capsule 0 More than a month at Unknown time    Review of Systems  Constitutional: Negative.   All other systems reviewed and are negative.   Blood pressure 120/82, pulse 82, temperature 97.6 F (36.4 C), temperature source Oral, resp. rate 18, height 5\' 2"  (1.575 m), weight 104.3 kg, last menstrual period 06/29/2017, SpO2 100 %. Physical Exam  Nursing note and vitals reviewed. Constitutional: She is oriented to person, place, and time. She appears well-developed and well-nourished.   HENT:  Head: Normocephalic and atraumatic.  Cardiovascular: Normal rate and regular rhythm.  Respiratory: Effort normal and breath sounds normal.  GI: Soft. Bowel sounds are normal.  Genitourinary:    Vagina and uterus normal.   Musculoskeletal:        General: Normal range of motion.     Cervical back: Normal range of motion and neck supple.  Neurological: She is alert and oriented to person, place, and time. She has normal reflexes.  Skin: Skin is warm and dry.  Psychiatric: She  has a normal mood and affect.    Results for orders placed or performed during the hospital encounter of 11/25/19 (from the past 24 hour(s))  Glucose, capillary     Status: Abnormal   Collection Time: 11/25/19 10:00 AM  Result Value Ref Range   Glucose-Capillary 104 (H) 70 - 99 mg/dL    No results found.  Assessment/Plan: PMB with structural lesion Diag HS, D&C poss myosure Surgical consent done. Surgical risks discussed.   Dontrell Stuck J 11/25/2019, 11:20 AM

## 2019-11-25 NOTE — Anesthesia Preprocedure Evaluation (Signed)
Anesthesia Evaluation  Patient identified by MRN, date of birth, ID band Patient awake    Reviewed: Allergy & Precautions, NPO status , Patient's Chart, lab work & pertinent test results  Airway Mallampati: III  TM Distance: >3 FB Neck ROM: Full    Dental no notable dental hx. (+) Teeth Intact   Pulmonary sleep apnea and Continuous Positive Airway Pressure Ventilation , pneumonia, resolved, former smoker,    Pulmonary exam normal breath sounds clear to auscultation       Cardiovascular hypertension, Pt. on medications Normal cardiovascular exam Rhythm:Regular Rate:Normal     Neuro/Psych Anxiety Peripheral neuropathy  Neuromuscular disease    GI/Hepatic Neg liver ROS, GERD  Medicated and Controlled,  Endo/Other  diabetes, Poorly Controlled, Type 2, Insulin Dependent, Oral Hypoglycemic AgentsHypothyroidism Morbid obesityHyperlipidemia Gout  Renal/GU Renal InsufficiencyRenal disease  negative genitourinary   Musculoskeletal   Abdominal (+) + obese,   Peds  Hematology  (+) anemia ,   Anesthesia Other Findings   Reproductive/Obstetrics PMB                             Anesthesia Physical Anesthesia Plan  ASA: III  Anesthesia Plan: General   Post-op Pain Management:    Induction: Intravenous  PONV Risk Score and Plan: 4 or greater and Ondansetron, Dexamethasone, Midazolam, Scopolamine patch - Pre-op and Treatment may vary due to age or medical condition  Airway Management Planned: LMA  Additional Equipment:   Intra-op Plan:   Post-operative Plan: Extubation in OR  Informed Consent: I have reviewed the patients History and Physical, chart, labs and discussed the procedure including the risks, benefits and alternatives for the proposed anesthesia with the patient or authorized representative who has indicated his/her understanding and acceptance.     Dental advisory given  Plan  Discussed with: CRNA and Surgeon  Anesthesia Plan Comments:         Anesthesia Quick Evaluation

## 2019-11-25 NOTE — Anesthesia Procedure Notes (Signed)
Procedure Name: LMA Insertion Date/Time: 11/25/2019 11:40 AM Performed by: Suan Halter, CRNA Pre-anesthesia Checklist: Patient identified, Emergency Drugs available, Suction available and Patient being monitored Patient Re-evaluated:Patient Re-evaluated prior to induction Oxygen Delivery Method: Circle system utilized Preoxygenation: Pre-oxygenation with 100% oxygen Induction Type: IV induction Ventilation: Mask ventilation without difficulty LMA: LMA inserted LMA Size: 4.0 Number of attempts: 1 Airway Equipment and Method: Bite block Placement Confirmation: positive ETCO2 Tube secured with: Tape Dental Injury: Teeth and Oropharynx as per pre-operative assessment

## 2019-11-25 NOTE — Op Note (Signed)
11/25/2019  12:17 PM  PATIENT:  Holly Rodriguez  55 y.o. female  PRE-OPERATIVE DIAGNOSIS:  Postmenopausal Bleeding  POST-OPERATIVE DIAGNOSIS:  Postmenopausal Bleeding Endometrial polyp SM fibroids  PROCEDURE:  Procedure(s): DILATATION & CURETTAGE/ HYSTEROSCOPY WITH MYOSURE RESECTION OF ENDOMETRIAL POLYPS AND SUBMUCOUS FIBROID  SURGEON:  Surgeon(s): Brien Few, MD  ASSISTANTS: none   ANESTHESIA:   local and general  ESTIMATED BLOOD LOSS: 5 mL   DRAINS: none   LOCAL MEDICATIONS USED:  MARCAINE    and Amount: 20 ml  SPECIMEN:  Source of Specimen:  FIBROID, POLYP, EMC  DISPOSITION OF SPECIMEN:  PATHOLOGY  COUNTS:  YES  DICTATION #: [id number]  PLAN OF CARE: DC HOME  PATIENT DISPOSITION:  PACU - hemodynamically stable.

## 2019-11-25 NOTE — Discharge Instructions (Signed)
Post Anesthesia Home Care Instructions  Activity: Get plenty of rest for the remainder of the day. A responsible adult should stay with you for 24 hours following the procedure.  For the next 24 hours, DO NOT: -Drive a car -Paediatric nurse -Drink alcoholic beverages -Take any medication unless instructed by your physician -Make any legal decisions or sign important papers.  Meals: Start with liquid foods such as gelatin or soup. Progress to regular foods as tolerated. Avoid greasy, spicy, heavy foods. If nausea and/or vomiting occur, drink only clear liquids until the nausea and/or vomiting subsides. Call your physician if vomiting continues.  Special Instructions/Symptoms: Your throat may feel dry or sore from the anesthesia or the breathing tube placed in your throat during surgery. If this causes discomfort, gargle with warm salt water. The discomfort should disappear within 24 hours.  If you had a scopolamine patch placed behind your ear for the management of post- operative nausea and/or vomiting:  1. The medication in the patch is effective for 72 hours, after which it should be removed.  Wrap patch in a tissue and discard in the trash. Wash hands thoroughly with soap and water. 2. You may remove the patch earlier than 72 hours if you experience unpleasant side effects which may include dry mouth, dizziness or visual disturbances. 3. Avoid touching the patch. Wash your hands with soap and water after contact with the patch.    Dilation and Curettage or Vacuum Curettage, Care After These instructions give you information about caring for yourself after your procedure. Your doctor may also give you more specific instructions. Call your doctor if you have any problems or questions after your procedure. Follow these instructions at home: Activity  Do not drive or use heavy machinery while taking prescription pain medicine.  For 24 hours after your procedure, avoid  driving.  Take short walks often, followed by rest periods. Ask your doctor what activities are safe for you. After one or two days, you may be able to return to your normal activities.  Do not lift anything that is heavier than 10 lb (4.5 kg) until your doctor approves.  For at least 2 weeks, or as long as told by your doctor: ? Do not douche. ? Do not use tampons. ? Do not have sex. General instructions   Take over-the-counter and prescription medicines only as told by your doctor. This is very important if you take blood thinning medicine.  Do not take baths, swim, or use a hot tub until your doctor approves. Take showers instead of baths.  Wear compression stockings as told by your doctor.  It is up to you to get the results of your procedure. Ask your doctor when your results will be ready.  Keep all follow-up visits as told by your doctor. This is important. Contact a doctor if:  You have very bad cramps that get worse or do not get better with medicine.  You have very bad pain in your belly (abdomen).  You cannot drink fluids without throwing up (vomiting).  You get pain in a different part of the area between your belly and thighs (pelvis).  You have bad-smelling discharge from your vagina.  You have a rash. Get help right away if:  You are bleeding a lot from your vagina. A lot of bleeding means soaking more than one sanitary pad in an hour, for 2 hours in a row.  You have clumps of blood (blood clots) coming from your vagina.  You  have a fever or chills.  Your belly feels very tender or hard.  You have chest pain.  You have trouble breathing.  You cough up blood.  You feel dizzy.  You feel light-headed.  You pass out (faint).  You have pain in your neck or shoulder area. Summary  Take short walks often, followed by rest periods. Ask your doctor what activities are safe for you. After one or two days, you may be able to return to your normal  activities.  Do not lift anything that is heavier than 10 lb (4.5 kg) until your doctor approves.  Do not take baths, swim, or use a hot tub until your doctor approves. Take showers instead of baths.  Contact your doctor if you have any symptoms of infection, like bad-smelling discharge from your vagina. This information is not intended to replace advice given to you by your health care provider. Make sure you discuss any questions you have with your health care provider. Document Revised: 10/17/2017 Document Reviewed: 07/22/2016 Elsevier Patient Education  2020 [employer].

## 2019-11-25 NOTE — Transfer of Care (Signed)
Immediate Anesthesia Transfer of Care Note  Patient: Holly Rodriguez  Procedure(s) Performed: Procedure(s) (LRB): DILATATION & CURETTAGE/HYSTEROSCOPY WITH MYOSURE (N/A)  Patient Location: PACU  Anesthesia Type: General  Level of Consciousness: awake, oriented, sedated and patient cooperative  Airway & Oxygen Therapy: Patient Spontanous Breathing and Patient connected to face mask oxygen  Post-op Assessment: Report given to PACU RN and Post -op Vital signs reviewed and stable  Post vital signs: Reviewed and stable  Complications: No apparent anesthesia complications Last Vitals:  Vitals Value Taken Time  BP 142/96 11/25/19 1218  Temp    Pulse 97 11/25/19 1224  Resp 20 11/25/19 1224  SpO2 97 % 11/25/19 1224  Vitals shown include unvalidated device data.  Last Pain:  Vitals:   11/25/19 1007  TempSrc:   PainSc: 5       Patients Stated Pain Goal: 3 (11/25/19 1007)

## 2019-11-26 LAB — SURGICAL PATHOLOGY

## 2019-11-26 NOTE — Anesthesia Postprocedure Evaluation (Addendum)
Anesthesia Post Note  Patient: Holly Rodriguez  Procedure(s) Performed: DILATATION & CURETTAGE/HYSTEROSCOPY WITH MYOSURE (N/A )     Patient location during evaluation: PACU Anesthesia Type: General Level of consciousness: awake and alert and oriented Pain management: pain level controlled Vital Signs Assessment: post-procedure vital signs reviewed and stable Respiratory status: spontaneous breathing, nonlabored ventilation and respiratory function stable Cardiovascular status: blood pressure returned to baseline and stable Postop Assessment: no apparent nausea or vomiting Anesthetic complications: no    Last Vitals:  Vitals:   11/25/19 0945 11/25/19 1218  BP: 120/82 (!) 142/96  Pulse: 82 (!) 104  Resp: 18 18  Temp: 36.4 C 36.7 C  SpO2: 100% 95%    Last Pain:  Vitals:   11/26/19 1007  TempSrc:   PainSc: 3                  [name] A.

## 2019-11-30 DIAGNOSIS — Z6841 Body Mass Index (BMI) 40.0 and over, adult: Secondary | ICD-10-CM | POA: Diagnosis not present

## 2019-11-30 DIAGNOSIS — N183 Chronic kidney disease, stage 3 unspecified: Secondary | ICD-10-CM | POA: Diagnosis not present

## 2019-11-30 DIAGNOSIS — E1122 Type 2 diabetes mellitus with diabetic chronic kidney disease: Secondary | ICD-10-CM | POA: Diagnosis not present

## 2019-12-11 DIAGNOSIS — Z20822 Contact with and (suspected) exposure to covid-19: Secondary | ICD-10-CM | POA: Diagnosis not present

## 2019-12-13 ENCOUNTER — Other Ambulatory Visit: Payer: BC Managed Care – PPO

## 2019-12-14 ENCOUNTER — Other Ambulatory Visit: Payer: Self-pay | Admitting: Family Medicine

## 2019-12-15 ENCOUNTER — Encounter: Payer: Self-pay | Admitting: Adult Health

## 2019-12-15 ENCOUNTER — Telehealth (INDEPENDENT_AMBULATORY_CARE_PROVIDER_SITE_OTHER): Payer: BC Managed Care – PPO | Admitting: Adult Health

## 2019-12-15 DIAGNOSIS — G4733 Obstructive sleep apnea (adult) (pediatric): Secondary | ICD-10-CM | POA: Diagnosis not present

## 2019-12-15 NOTE — Progress Notes (Signed)
Virtual Visit via Video Note  I connected with Holly Rodriguez on 12/15/19 at 10:30 AM EST by a video enabled telemedicine application and verified that I am speaking with the correct person using two identifiers.  Location: Patient: Home  Provider: Office    I discussed the limitations of evaluation and management by telemedicine and the availability of in person appointments. The patient expressed understanding and agreed to proceed.  History of Present Illness: 54 year old female followed for obstructive sleep apnea.  She is on nocturnal CPAP. Today's virtual visit is a 1 year follow-up for sleep apnea.  Patient says she is doing well on CPAP.  She wears it each night for over 8 hrs . Says can not sleep without it,  She says she feels rested with no significant daytime sleepiness.  She feels that she benefits from CPAP.  Unable to get download., not sure if it has an SD card.  Has full face mask , air fit. Uses [facility].  . Says she needs a new machine, hers is getting old.    Observations/Objective: PSG 01/04/09 >> AHI 16.6, SpO2 low 88%  Assessment and Plan: Obstructive sleep apnea excellent control compliance on nocturnal CPAP  Obesity discussed healthy weight loss  Plan  Patient Instructions  Keep up the good work Continue on CPAP at bedtime Work on healthy weight Do not drive if sleepy Order for new CPAP sent to DME  Follow-up in 1 year with Dr. Halford Chessman and as needed        Follow Up Instructions: Follow-up in 1 year and as needed    I discussed the assessment and treatment plan with the patient. The patient was provided an opportunity to ask questions and all were answered. The patient agreed with the plan and demonstrated an understanding of the instructions.   The patient was advised to call back or seek an in-person evaluation if the symptoms worsen or if the condition fails to improve as anticipated.  I provided 22 minutes of non-face-to-face time during this  encounter.   Rexene Edison, NP  f

## 2019-12-15 NOTE — Progress Notes (Signed)
Reviewed and agree with assessment/plan.   Roverto Bodmer, MD [facility] 11/13/2016, 12:24 PM Pager:  [phone]  

## 2019-12-15 NOTE — Patient Instructions (Addendum)
Keep up the good work Continue on CPAP at bedtime Work on healthy weight Do not drive if sleepy Order for new CPAP sent to DME  Follow-up in 1 year with Dr. Halford Chessman and as needed

## 2019-12-15 NOTE — Addendum Note (Signed)
Addended by: Parke Poisson E on: 12/15/2019 12:59 PM   Modules accepted: Orders

## 2019-12-16 ENCOUNTER — Telehealth: Payer: Self-pay | Admitting: *Deleted

## 2019-12-16 NOTE — Telephone Encounter (Signed)
Rx for Synjardy sent yesterday has sig: Take 1 tablet by mouth 2 times daily. Most of the time its once daily?   Clarify?

## 2019-12-16 NOTE — Telephone Encounter (Signed)
Yes, twice daily. I think I fixed this and gave it to [place].

## 2019-12-17 ENCOUNTER — Other Ambulatory Visit: Payer: Self-pay | Admitting: *Deleted

## 2019-12-17 DIAGNOSIS — F411 Generalized anxiety disorder: Secondary | ICD-10-CM

## 2019-12-17 NOTE — Telephone Encounter (Signed)
This was signed and faxed back to [facility] Will be scanned in

## 2019-12-22 DIAGNOSIS — Z9889 Other specified postprocedural states: Secondary | ICD-10-CM | POA: Diagnosis not present

## 2020-01-22 ENCOUNTER — Other Ambulatory Visit: Payer: Self-pay | Admitting: Family Medicine

## 2020-01-22 DIAGNOSIS — E119 Type 2 diabetes mellitus without complications: Secondary | ICD-10-CM

## 2020-02-03 ENCOUNTER — Other Ambulatory Visit: Payer: Self-pay

## 2020-02-03 ENCOUNTER — Ambulatory Visit: Payer: BC Managed Care – PPO | Admitting: Family Medicine

## 2020-02-03 ENCOUNTER — Encounter: Payer: Self-pay | Admitting: Family Medicine

## 2020-02-03 VITALS — BP 93/63 | HR 75 | Temp 98.0°F | Resp 20 | Ht 62.0 in | Wt 235.0 lb

## 2020-02-03 DIAGNOSIS — E1159 Type 2 diabetes mellitus with other circulatory complications: Secondary | ICD-10-CM

## 2020-02-03 DIAGNOSIS — I152 Hypertension secondary to endocrine disorders: Secondary | ICD-10-CM

## 2020-02-03 DIAGNOSIS — E079 Disorder of thyroid, unspecified: Secondary | ICD-10-CM

## 2020-02-03 DIAGNOSIS — N183 Chronic kidney disease, stage 3 unspecified: Secondary | ICD-10-CM | POA: Diagnosis not present

## 2020-02-03 DIAGNOSIS — E1165 Type 2 diabetes mellitus with hyperglycemia: Secondary | ICD-10-CM | POA: Diagnosis not present

## 2020-02-03 DIAGNOSIS — E785 Hyperlipidemia, unspecified: Secondary | ICD-10-CM

## 2020-02-03 DIAGNOSIS — E1169 Type 2 diabetes mellitus with other specified complication: Secondary | ICD-10-CM

## 2020-02-03 DIAGNOSIS — I10 Essential (primary) hypertension: Secondary | ICD-10-CM

## 2020-02-03 DIAGNOSIS — E1122 Type 2 diabetes mellitus with diabetic chronic kidney disease: Secondary | ICD-10-CM

## 2020-02-03 DIAGNOSIS — M109 Gout, unspecified: Secondary | ICD-10-CM

## 2020-02-03 LAB — BAYER DCA HB A1C WAIVED: HB A1C (BAYER DCA - WAIVED): 6.1 % (ref <7.0)

## 2020-02-03 MED ORDER — SYNJARDY 12.5-1000 MG PO TABS: 1.0000 | ORAL_TABLET | Freq: Two times a day (BID) | ORAL | 0 refills | Status: DC

## 2020-02-03 MED ORDER — PRAVASTATIN SODIUM 20 MG PO TABS: 20.0000 mg | ORAL_TABLET | Freq: Every day | ORAL | 3 refills | Status: DC

## 2020-02-03 MED ORDER — FENOFIBRATE 160 MG PO TABS: 160.0000 mg | ORAL_TABLET | Freq: Every day | ORAL | 1 refills | Status: DC

## 2020-02-03 MED ORDER — ALLOPURINOL 300 MG PO TABS: ORAL_TABLET | ORAL | 0 refills | Status: DC

## 2020-02-03 MED ORDER — LEVOTHYROXINE SODIUM 75 MCG PO TABS: ORAL_TABLET | ORAL | 3 refills | Status: DC

## 2020-02-03 MED ORDER — OZEMPIC (0.25 OR 0.5 MG/DOSE) 2 MG/1.5ML ~~LOC~~ SOPN: 0.5000 mg | PEN_INJECTOR | SUBCUTANEOUS | 0 refills | Status: DC

## 2020-02-03 MED ORDER — LISINOPRIL 20 MG PO TABS: 20.0000 mg | ORAL_TABLET | Freq: Every day | ORAL | 0 refills | Status: DC

## 2020-02-03 NOTE — Patient Instructions (Signed)

## 2020-02-03 NOTE — Progress Notes (Signed)
Subjective:  Patient ID: Holly Rodriguez, female    DOB: 11-Oct-1966, 54 y.o.   MRN: [id number]  Patient Care Team: Sharion Balloon, FNP as PCP - General (Family Medicine)   Chief Complaint:  Medical Management of Chronic Issues (6 mo ), Hypothyroidism, Hyperlipidemia, Hypertension, and Diabetes   HPI: Holly Rodriguez is a 54 y.o. female presenting on 02/03/2020 for Medical Management of Chronic Issues (6 mo ), Hypothyroidism, Hyperlipidemia, Hypertension, and Diabetes   1. Type 2 diabetes mellitus with hyperglycemia, without long-term current use of insulin ([place]) Reports doing well with diet and blood sugar control. Compliant with medications without associated side effects. No polyuria, polydipsia, or polyphagia.   2. CKD stage 3 due to type 2 diabetes mellitus (HCC) No changes in urine output. No edema, weakness, confusion, weight changes, or fatigue.   3. Hypertension and hyperlipidemia associated with diabetes ([place]) Compliant with medications and does try to watch diet. No associated side effects from medications. Does not exercise on a regular basis. No chest pain, headaches, shortness of breath, palpitations, or leg swelling. No confusion or weakness.   4. Morbid obesity ([place]) Has been doing better with diet. Has not been exercising on a regular basis.      Relevant past medical, surgical, family, and social history reviewed and updated as indicated.  Allergies and medications reviewed and updated. Date reviewed: Chart in Epic.   Past Medical History:  Diagnosis Date  . Anxiety   . Bulging of intervertebral disc between L4 and L5   . CKD (chronic kidney disease), stage III    Due to Diabetes  . Diabetes mellitus without complication ([place])   . Family history of adverse reaction to anesthesia    mother post op nausea and vomiting  . Fatty liver   . GERD (gastroesophageal reflux disease)   . Gout   . Herpes    Cold sores  . History of appendicitis 2014  .  History of colon polyps   . History of migraine   . Hyperlipidemia   . Hypertension   . Hypothyroidism   . IBS (irritable bowel syndrome)   . Iron deficiency anemia   . Numbness and tingling of both lower extremities    Right is greather than left  . Obesity   . Peripheral neuropathy   . Pneumonia 01/2016  . Postmenopausal bleeding   . Preseptal cellulitis of left eye 11/06/2019  . Right hip pain   . Seasonal allergies   . Skin cancer    left arm  . Sleep apnea   . Uterine polyp   . Wears glasses     Past Surgical History:  Procedure Laterality Date  . CESAREAN SECTION     x2  . CHOLECYSTECTOMY    . COLONOSCOPY  11/23/2018  . DILATATION & CURETTAGE/HYSTEROSCOPY WITH MYOSURE N/A 11/25/2019   Procedure: DILATATION & CURETTAGE/HYSTEROSCOPY WITH MYOSURE;  Surgeon: Brien Few, MD;  Location: [facility];  Service: Gynecology;  Laterality: N/A;  . ESOPHAGEAL DILATION     Stomach dilation after gastric surgery  . GASTRIC RESTRICTION SURGERY     x3  . LAPAROSCOPIC APPENDECTOMY N/A 10/25/2013   Procedure: APPENDECTOMY LAPAROSCOPIC;  Surgeon: Jamesetta So, MD;  Location: [facility];  Service: General;  Laterality: N/A;  . SKIN CANCER EXCISION Left    Arm  . TONSILLECTOMY    . TUBAL LIGATION    . UPPER GI ENDOSCOPY      Social History  Socioeconomic History  . Marital status: Married    Spouse name: Not on file  . Number of children: Not on file  . Years of education: Not on file  . Highest education level: Not on file  Occupational History  . Not on file  Tobacco Use  . Smoking status: Former Smoker    Years: 2.00    Types: Cigarettes    Quit date: 11/18/2008    Years since quitting: 11.2  . Smokeless tobacco: Never Used  . Tobacco comment: quit smoking about 1o0 years ago but passive smoking exposure  Substance and Sexual Activity  . Alcohol use: Yes    Comment: occasional  . Drug use: No  . Sexual activity: Not on file  Other Topics Concern  .  Not on file  Social History Narrative  . Not on file   Social Determinants of Health   Financial Resource Strain:   . Difficulty of Paying Living Expenses:   Food Insecurity:   . Worried About Charity fundraiser in the Last Year:   . Arboriculturist in the Last Year:   Transportation Needs:   . Film/video editor (Medical):   Marland Kitchen Lack of Transportation (Non-Medical):   Physical Activity:   . Days of Exercise per Week:   . Minutes of Exercise per Session:   Stress:   . Feeling of Stress :   Social Connections:   . Frequency of Communication with Friends and Family:   . Frequency of Social Gatherings with Friends and Family:   . Attends Religious Services:   . Active Member of Clubs or Organizations:   . Attends Archivist Meetings:   Marland Kitchen Marital Status:   Intimate Partner Violence:   . Fear of Current or Ex-Partner:   . Emotionally Abused:   Marland Kitchen Physically Abused:   . Sexually Abused:     Outpatient Encounter Medications as of 02/03/2020  Medication Sig  . allopurinol (ZYLOPRIM) 300 MG tablet TAKE 1 TABLET BY MOUTH  DAILY FOR GOUT PREVENTION  . ALPRAZolam (XANAX) 0.5 MG tablet Take 2 tablets (1 mg total) by mouth at bedtime as needed for anxiety.  . Empagliflozin-metFORMIN HCl (SYNJARDY) 12.03-999 MG TABS Take 1 tablet by mouth 2 (two) times daily. Most of the time its once daily  . fenofibrate 160 MG tablet TAKE 1 TABLET BY MOUTH  DAILY  . glucose blood test strip Reli-on glucometer.  Test Blood sugar qid Dx. 250.01  . Lancets (ONETOUCH ULTRASOFT) lancets Test 1X per day and prn   Dx 250.01  . levothyroxine (SYNTHROID, LEVOTHROID) 75 MCG tablet TAKE 1 TABLET BY MOUTH  DAILY BEFORE BREAKFAST  . lisinopril (ZESTRIL) 20 MG tablet TAKE 1 TABLET BY MOUTH  DAILY  . Omega-3 Fatty Acids (OMEGA 3 PO) Take 4 capsules by mouth daily.  Marland Kitchen omeprazole (PRILOSEC) 40 MG capsule TAKE 1 CAPSULE BY MOUTH  DAILY  . potassium chloride (K-DUR) 10 MEQ tablet TAKE 1 TABLET BY MOUTH  DAILY   . pravastatin (PRAVACHOL) 20 MG tablet Take 1 tablet (20 mg total) by mouth daily. (Patient taking differently: Take 20 mg by mouth 3 (three) times a week. )  . Semaglutide,0.25 or 0.'5MG'$ /DOS, (OZEMPIC, 0.25 OR 0.5 MG/DOSE,) 2 MG/1.5ML SOPN Inject 0.5 mg into the skin once a week. (Needs to be seen before next refill)  . Vitamin D, Ergocalciferol, (DRISDOL) 1.25 MG (50000 UNIT) CAPS capsule TAKE 1 CAPSULE BY MOUTH  EVERY 7 DAYS  . colchicine 0.6 MG  tablet Take 1 tablet (0.6 mg total) by mouth 2 (two) times daily. Until symptoms clear. (Patient not taking: Reported on 02/03/2020)  . cyclobenzaprine (FLEXERIL) 10 MG tablet Take 1 tablet (10 mg total) by mouth 3 (three) times daily as needed for muscle spasms. (Patient not taking: Reported on 02/03/2020)  . traMADol (ULTRAM) 50 MG tablet Take 1-2 tablets (50-100 mg total) by mouth every 6 (six) hours as needed. (Patient not taking: Reported on 02/03/2020)  . valACYclovir (VALTREX) 1000 MG tablet Take 1 tablet (1,000 mg total) daily by mouth. As directed (Patient not taking: Reported on 02/03/2020)  . [DISCONTINUED] amoxicillin (AMOXIL) 875 MG tablet Take 1 tablet (875 mg total) by mouth 2 (two) times daily.   No facility-administered encounter medications on file as of 02/03/2020.    No Known Allergies  Review of Systems  Constitutional: Negative for activity change, appetite change, chills, diaphoresis, fatigue, fever and unexpected weight change.  HENT: Negative.   Eyes: Negative.  Negative for photophobia and visual disturbance.  Respiratory: Negative for cough, chest tightness and shortness of breath.   Cardiovascular: Negative for chest pain, palpitations and leg swelling.  Gastrointestinal: Negative for abdominal pain, blood in stool, constipation, diarrhea, nausea and vomiting.  Endocrine: Negative.  Negative for cold intolerance, heat intolerance, polydipsia, polyphagia and polyuria.  Genitourinary: Negative for decreased urine volume,  difficulty urinating, dysuria, frequency and urgency.  Musculoskeletal: Negative for arthralgias and myalgias.  Skin: Negative.   Allergic/Immunologic: Negative.   Neurological: Negative for dizziness, tremors, seizures, syncope, facial asymmetry, speech difficulty, weakness, light-headedness, numbness and headaches.  Hematological: Negative.   Psychiatric/Behavioral: Negative for confusion, hallucinations, sleep disturbance and suicidal ideas.  All other systems reviewed and are negative.       Objective:  BP 93/63   Pulse 75   Temp 98 F (36.7 C)   Resp 20   Ht _0  (1.575 m)   Wt 235 lb (106.6 kg)   LMP 06/29/2017   SpO2 96%   BMI 42.98 kg/m    Wt Readings from Last 3 Encounters:  02/03/20 235 lb (106.6 kg)  11/18/19 230 lb (104.3 kg)  05/07/19 255 lb (115.7 kg)    Physical Exam Vitals and nursing note reviewed.  Constitutional:      General: She is not in acute distress.    Appearance: Normal appearance. She is well-developed and well-groomed. She is morbidly obese. She is not ill-appearing, toxic-appearing or diaphoretic.  HENT:     Head: Normocephalic and atraumatic.     Jaw: There is normal jaw occlusion.     Right Ear: Hearing normal.     Left Ear: Hearing normal.     Nose: Nose normal.     Mouth/Throat:     Lips: Pink.     Mouth: Mucous membranes are moist.     Pharynx: Oropharynx is clear. Uvula midline.  Eyes:     General: Lids are normal.     Extraocular Movements: Extraocular movements intact.     Conjunctiva/sclera: Conjunctivae normal.     Pupils: Pupils are equal, round, and reactive to light.  Neck:     Thyroid: No thyroid mass, thyromegaly or thyroid tenderness.     Vascular: No carotid bruit or JVD.     Trachea: Trachea and phonation normal.  Cardiovascular:     Rate and Rhythm: Normal rate and regular rhythm.     Chest Wall: PMI is not displaced.     Pulses: Normal pulses.     Heart sounds:  Normal heart sounds. No murmur. No friction  rub. No gallop.   Pulmonary:     Effort: Pulmonary effort is normal. No respiratory distress.     Breath sounds: Normal breath sounds. No wheezing.  Abdominal:     General: Bowel sounds are normal. There is no distension or abdominal bruit.     Palpations: Abdomen is soft. There is no hepatomegaly or splenomegaly.     Tenderness: There is no abdominal tenderness. There is no right CVA tenderness or left CVA tenderness.     Hernia: No hernia is present.  Musculoskeletal:        General: Normal range of motion.     Cervical back: Normal range of motion and neck supple.     Right lower leg: No edema.     Left lower leg: No edema.  Lymphadenopathy:     Cervical: No cervical adenopathy.  Skin:    General: Skin is warm and dry.     Capillary Refill: Capillary refill takes less than 2 seconds.     Coloration: Skin is not cyanotic, jaundiced or pale.     Findings: No rash.  Neurological:     General: No focal deficit present.     Mental Status: She is alert and oriented to person, place, and time.     Cranial Nerves: Cranial nerves are intact. No cranial nerve deficit.     Sensory: Sensation is intact. No sensory deficit.     Motor: Motor function is intact. No weakness.     Coordination: Coordination is intact. Coordination normal.     Gait: Gait is intact. Gait normal.     Deep Tendon Reflexes: Reflexes are normal and symmetric. Reflexes normal.  Psychiatric:        Attention and Perception: Attention and perception normal.        Mood and Affect: Mood and affect normal.        Speech: Speech normal.        Behavior: Behavior normal. Behavior is cooperative.        Thought Content: Thought content normal.        Cognition and Memory: Cognition and memory normal.        Judgment: Judgment normal.     Results for orders placed or performed in visit on 02/03/20  CMP14+EGFR  Result Value Ref Range   Glucose WILL FOLLOW    BUN WILL FOLLOW    Creatinine, Ser WILL FOLLOW    GFR calc  non Af Amer WILL FOLLOW    GFR calc Af Amer WILL FOLLOW    BUN/Creatinine Ratio WILL FOLLOW    Sodium WILL FOLLOW    Potassium WILL FOLLOW    Chloride WILL FOLLOW    CO2 WILL FOLLOW    Calcium WILL FOLLOW    Total Protein WILL FOLLOW    Albumin WILL FOLLOW    Globulin, Total WILL FOLLOW    Albumin/Globulin Ratio WILL FOLLOW    Bilirubin Total WILL FOLLOW    Alkaline Phosphatase WILL FOLLOW    AST WILL FOLLOW    ALT WILL FOLLOW   CBC with Differential/Platelet  Result Value Ref Range   WBC 8.9 3.4 - 10.8 x10E3/uL   RBC 4.36 3.77 - 5.28 x10E6/uL   Hemoglobin 12.5 11.1 - 15.9 g/dL   Hematocrit 38.5 34.0 - 46.6 %   MCV 88 79 - 97 fL   MCH 28.7 26.6 - 33.0 pg   MCHC 32.5 31.5 - 35.7 g/dL   RDW  14.2 11.7 - 15.4 %   Platelets 297 150 - 450 x10E3/uL   Neutrophils 46 Not Estab. %   Lymphs 45 Not Estab. %   Monocytes 5 Not Estab. %   Eos 3 Not Estab. %   Basos 1 Not Estab. %   Neutrophils Absolute 4.1 1.4 - 7.0 x10E3/uL   Lymphocytes Absolute 4.1 (H) 0.7 - 3.1 x10E3/uL   Monocytes Absolute 0.4 0.1 - 0.9 x10E3/uL   EOS (ABSOLUTE) 0.3 0.0 - 0.4 x10E3/uL   Basophils Absolute 0.1 0.0 - 0.2 x10E3/uL   Immature Granulocytes 0 Not Estab. %   Immature Grans (Abs) 0.0 0.0 - 0.1 x10E3/uL  Lipid panel  Result Value Ref Range   Cholesterol, Total WILL FOLLOW    Triglycerides WILL FOLLOW    HDL WILL FOLLOW    VLDL Cholesterol Cal WILL FOLLOW    LDL Chol Calc (NI[facility]) WILL FOLLOW    Lipid Comment: WILL FOLLOW    Chol/HDL Ratio WILL FOLLOW   Thyroid Panel With TSH  Result Value Ref Range   TSH WILL FOLLOW    T4, Total WILL FOLLOW    T3 Uptake Ratio WILL FOLLOW    Free Thyroxine Index WILL FOLLOW   Bayer DCA Hb A1c Waived  Result Value Ref Range   HB A1C (BAYER DCA - WAIVED) 6.1 <7.0 %       Pertinent labs & imaging results that were available during my care of the patient were reviewed by me and considered in my medical decision making.  Assessment & Plan:  Holly Rodriguez was seen today for  medical management of chronic issues, hypothyroidism, hyperlipidemia, hypertension and diabetes.  Diagnoses and all orders for this visit:  Type 2 diabetes mellitus with hyperglycemia, without long-term current use of insulin (HCC) A1C 6.1 today. Great job, keep up the great work. Other labs pending. Diet and exercise encouraged.  -     CMP14+EGFR -     Microalbumin / creatinine urine ratio -     Bayer DCA Hb A1c Waived -     For home use only DME Other see comment  CKD stage 3 due to type 2 diabetes mellitus (HCC) No changes in urinary output. Labs pending.  -     CMP14+EGFR -     CBC with Differential/Platelet -     Microalbumin / creatinine urine ratio  Hypertension associated with diabetes (HCC) BP well controlled. Changes were not made in regimen today. Goal BP is 130/80. Pt aware to report any persistent high or low readings. DASH diet and exercise encouraged. Exercise at least 150 minutes per week and increase as tolerated. Goal BMI > 25. Stress management encouraged. Avoid nicotine and tobacco product use. Avoid excessive alcohol and NSAID's. Avoid more than 2000 mg of sodium daily. Medications as prescribed. Follow up as scheduled.  -     CMP14+EGFR -     CBC with Differential/Platelet -     Thyroid Panel With TSH -     Microalbumin / creatinine urine ratio -     Bayer DCA Hb A1c Waived  Morbid obesity (HCC) Diet and exercise encouraged. Labs pending. -     CMP14+EGFR -     CBC with Differential/Platelet -     Lipid panel -     Thyroid Panel With TSH     Continue all other maintenance medications.  Follow up plan: Return in about 3 months (around 05/05/2020), or if symptoms worsen or fail to improve, for DM.  Medical decision-making:   30 minutes spent today reviewing the medical chart, counseling the patient/family, and documenting today's visit.  Continue healthy lifestyle choices, including diet (rich in fruits, vegetables, and lean proteins, and low in salt and  simple carbohydrates) and exercise (at least 30 minutes of moderate physical activity daily).  Educational handout given for DM  The above assessment and management plan was discussed with the patient. The patient verbalized understanding of and has agreed to the management plan. Patient is aware to call the clinic if they develop any new symptoms or if symptoms persist or worsen. Patient is aware when to return to the clinic for a follow-up visit. Patient educated on when it is appropriate to go to the emergency department.   Monia Pouch, FNP-C [facility] [phone]

## 2020-02-04 LAB — CBC WITH DIFFERENTIAL/PLATELET
Basophils Absolute: 0.1 10*3/uL (ref 0.0–0.2)
Basos: 1 %
EOS (ABSOLUTE): 0.3 10*3/uL (ref 0.0–0.4)
Eos: 3 %
Hematocrit: 38.5 % (ref 34.0–46.6)
Hemoglobin: 12.5 g/dL (ref 11.1–15.9)
Immature Grans (Abs): 0 10*3/uL (ref 0.0–0.1)
Immature Granulocytes: 0 %
Lymphocytes Absolute: 4.1 10*3/uL — ABNORMAL HIGH (ref 0.7–3.1)
Lymphs: 45 %
MCH: 28.7 pg (ref 26.6–33.0)
MCHC: 32.5 g/dL (ref 31.5–35.7)
MCV: 88 fL (ref 79–97)
Monocytes Absolute: 0.4 10*3/uL (ref 0.1–0.9)
Monocytes: 5 %
Neutrophils Absolute: 4.1 10*3/uL (ref 1.4–7.0)
Neutrophils: 46 %
Platelets: 297 10*3/uL (ref 150–450)
RBC: 4.36 x10E6/uL (ref 3.77–5.28)
RDW: 14.2 % (ref 11.7–15.4)
WBC: 8.9 10*3/uL (ref 3.4–10.8)

## 2020-02-04 LAB — CMP14+EGFR
ALT: 19 IU/L (ref 0–32)
AST: 24 IU/L (ref 0–40)
Albumin/Globulin Ratio: 1.8 (ref 1.2–2.2)
Albumin: 4.4 g/dL (ref 3.8–4.9)
Alkaline Phosphatase: 69 IU/L (ref 39–117)
BUN/Creatinine Ratio: 20 (ref 9–23)
BUN: 34 mg/dL — ABNORMAL HIGH (ref 6–24)
Bilirubin Total: <0.2 mg/dL (ref 0.0–1.2)
CO2: 20 mmol/L (ref 20–29)
Calcium: 9.3 mg/dL (ref 8.7–10.2)
Chloride: 103 mmol/L (ref 96–106)
Creatinine, Ser: 1.68 mg/dL — ABNORMAL HIGH (ref 0.57–1.00)
GFR calc Af Amer: 40 mL/min/{1.73_m2} — ABNORMAL LOW (ref >59)
GFR calc non Af Amer: 34 mL/min/{1.73_m2} — ABNORMAL LOW (ref >59)
Globulin, Total: 2.5 g/dL (ref 1.5–4.5)
Glucose: 100 mg/dL — ABNORMAL HIGH (ref 65–99)
Potassium: 5.2 mmol/L (ref 3.5–5.2)
Sodium: 139 mmol/L (ref 134–144)
Total Protein: 6.9 g/dL (ref 6.0–8.5)

## 2020-02-04 LAB — THYROID PANEL WITH TSH
Free Thyroxine Index: 2.1 (ref 1.2–4.9)
T3 Uptake Ratio: 29 % (ref 24–39)
T4, Total: 7.3 ug/dL (ref 4.5–12.0)
TSH: 1.23 u[IU]/mL (ref 0.450–4.500)

## 2020-02-04 LAB — LIPID PANEL
Chol/HDL Ratio: 3.2 ratio (ref 0.0–4.4)
Cholesterol, Total: 136 mg/dL (ref 100–199)
HDL: 43 mg/dL (ref >39)
LDL Chol Calc (NIH): 68 mg/dL (ref 0–99)
Triglycerides: 144 mg/dL (ref 0–149)
VLDL Cholesterol Cal: 25 mg/dL (ref 5–40)

## 2020-02-06 NOTE — Progress Notes (Signed)
Cholesterol and thyroid function are normal.  CBC normal.  Glucose elevated at 100. Continue diet, exercise, and medications.  Renal function remains declined and has declined a little more. Please make sure you are drinking plenty of water. This will need to be monitored.

## 2020-02-15 DIAGNOSIS — G4733 Obstructive sleep apnea (adult) (pediatric): Secondary | ICD-10-CM | POA: Diagnosis not present

## 2020-02-21 DIAGNOSIS — Z1231 Encounter for screening mammogram for malignant neoplasm of breast: Secondary | ICD-10-CM | POA: Diagnosis not present

## 2020-03-16 LAB — HM DIABETES EYE EXAM

## 2020-03-17 DIAGNOSIS — G4733 Obstructive sleep apnea (adult) (pediatric): Secondary | ICD-10-CM | POA: Diagnosis not present

## 2020-03-21 ENCOUNTER — Other Ambulatory Visit: Payer: Self-pay | Admitting: *Deleted

## 2020-03-21 MED ORDER — POTASSIUM CHLORIDE ER 10 MEQ PO TBCR: 10.0000 meq | EXTENDED_RELEASE_TABLET | Freq: Every day | ORAL | 3 refills | Status: DC

## 2020-03-21 MED ORDER — OMEPRAZOLE 40 MG PO CPDR: 40.0000 mg | DELAYED_RELEASE_CAPSULE | Freq: Every day | ORAL | 3 refills | Status: DC

## 2020-03-24 ENCOUNTER — Other Ambulatory Visit: Payer: Self-pay | Admitting: *Deleted

## 2020-03-24 DIAGNOSIS — E1165 Type 2 diabetes mellitus with hyperglycemia: Secondary | ICD-10-CM

## 2020-03-24 MED ORDER — OZEMPIC (0.25 OR 0.5 MG/DOSE) 2 MG/1.5ML ~~LOC~~ SOPN: 0.5000 mg | PEN_INJECTOR | SUBCUTANEOUS | 2 refills | Status: DC

## 2020-03-24 NOTE — Telephone Encounter (Signed)
This pt seen Rakes in March. Rakes wanted pt seen before refills of Ozempic could be given. We do have one sample in the office.   Are you ok giving this to pt and sending in refills? Pt has an appt with Christy. It is not scheduled in Sept for a 65m follow up.

## 2020-03-27 DIAGNOSIS — G4733 Obstructive sleep apnea (adult) (pediatric): Secondary | ICD-10-CM | POA: Diagnosis not present

## 2020-04-16 DIAGNOSIS — G4733 Obstructive sleep apnea (adult) (pediatric): Secondary | ICD-10-CM | POA: Diagnosis not present

## 2020-04-20 ENCOUNTER — Encounter: Payer: Self-pay | Admitting: *Deleted

## 2020-05-05 ENCOUNTER — Other Ambulatory Visit: Payer: Self-pay

## 2020-05-05 DIAGNOSIS — I152 Hypertension secondary to endocrine disorders: Secondary | ICD-10-CM

## 2020-05-05 DIAGNOSIS — N183 Chronic kidney disease, stage 3 unspecified: Secondary | ICD-10-CM

## 2020-05-05 DIAGNOSIS — M109 Gout, unspecified: Secondary | ICD-10-CM

## 2020-05-05 DIAGNOSIS — E1159 Type 2 diabetes mellitus with other circulatory complications: Secondary | ICD-10-CM

## 2020-05-05 DIAGNOSIS — E1165 Type 2 diabetes mellitus with hyperglycemia: Secondary | ICD-10-CM

## 2020-05-05 DIAGNOSIS — E1122 Type 2 diabetes mellitus with diabetic chronic kidney disease: Secondary | ICD-10-CM

## 2020-05-05 MED ORDER — LISINOPRIL 20 MG PO TABS: 20.0000 mg | ORAL_TABLET | Freq: Every day | ORAL | 0 refills | Status: DC

## 2020-05-05 MED ORDER — ALLOPURINOL 300 MG PO TABS: ORAL_TABLET | ORAL | 0 refills | Status: DC

## 2020-05-05 MED ORDER — SYNJARDY 12.5-1000 MG PO TABS: 1.0000 | ORAL_TABLET | Freq: Two times a day (BID) | ORAL | 0 refills | Status: DC

## 2020-05-17 DIAGNOSIS — G4733 Obstructive sleep apnea (adult) (pediatric): Secondary | ICD-10-CM | POA: Diagnosis not present

## 2020-05-29 DIAGNOSIS — G4733 Obstructive sleep apnea (adult) (pediatric): Secondary | ICD-10-CM | POA: Diagnosis not present

## 2020-06-01 ENCOUNTER — Other Ambulatory Visit: Payer: Self-pay | Admitting: *Deleted

## 2020-06-16 DIAGNOSIS — G4733 Obstructive sleep apnea (adult) (pediatric): Secondary | ICD-10-CM | POA: Diagnosis not present

## 2020-06-22 ENCOUNTER — Ambulatory Visit: Payer: BC Managed Care – PPO | Admitting: Family

## 2020-06-22 ENCOUNTER — Other Ambulatory Visit: Payer: Self-pay

## 2020-06-22 ENCOUNTER — Encounter: Payer: Self-pay | Admitting: Family

## 2020-06-22 VITALS — BP 118/84 | HR 77 | Temp 97.6°F | Ht 62.0 in | Wt 233.6 lb

## 2020-06-22 DIAGNOSIS — R5383 Other fatigue: Secondary | ICD-10-CM

## 2020-06-22 DIAGNOSIS — E559 Vitamin D deficiency, unspecified: Secondary | ICD-10-CM | POA: Diagnosis not present

## 2020-06-22 DIAGNOSIS — E785 Hyperlipidemia, unspecified: Secondary | ICD-10-CM

## 2020-06-22 DIAGNOSIS — E611 Iron deficiency: Secondary | ICD-10-CM | POA: Diagnosis not present

## 2020-06-22 DIAGNOSIS — E1165 Type 2 diabetes mellitus with hyperglycemia: Secondary | ICD-10-CM | POA: Diagnosis not present

## 2020-06-22 DIAGNOSIS — M109 Gout, unspecified: Secondary | ICD-10-CM

## 2020-06-22 DIAGNOSIS — E1159 Type 2 diabetes mellitus with other circulatory complications: Secondary | ICD-10-CM

## 2020-06-22 DIAGNOSIS — I152 Hypertension secondary to endocrine disorders: Secondary | ICD-10-CM

## 2020-06-22 DIAGNOSIS — E1169 Type 2 diabetes mellitus with other specified complication: Secondary | ICD-10-CM

## 2020-06-22 DIAGNOSIS — E1122 Type 2 diabetes mellitus with diabetic chronic kidney disease: Secondary | ICD-10-CM

## 2020-06-22 DIAGNOSIS — F411 Generalized anxiety disorder: Secondary | ICD-10-CM | POA: Diagnosis not present

## 2020-06-22 DIAGNOSIS — E781 Pure hyperglyceridemia: Secondary | ICD-10-CM

## 2020-06-22 DIAGNOSIS — N183 Chronic kidney disease, stage 3 unspecified: Secondary | ICD-10-CM

## 2020-06-22 DIAGNOSIS — G4733 Obstructive sleep apnea (adult) (pediatric): Secondary | ICD-10-CM

## 2020-06-22 DIAGNOSIS — Z79899 Other long term (current) drug therapy: Secondary | ICD-10-CM

## 2020-06-22 DIAGNOSIS — Z1159 Encounter for screening for other viral diseases: Secondary | ICD-10-CM

## 2020-06-22 DIAGNOSIS — E079 Disorder of thyroid, unspecified: Secondary | ICD-10-CM

## 2020-06-22 DIAGNOSIS — I10 Essential (primary) hypertension: Secondary | ICD-10-CM

## 2020-06-22 DIAGNOSIS — Z79891 Long term (current) use of opiate analgesic: Secondary | ICD-10-CM | POA: Diagnosis not present

## 2020-06-22 LAB — BAYER DCA HB A1C WAIVED: HB A1C (BAYER DCA - WAIVED): 6.2 % (ref <7.0)

## 2020-06-22 MED ORDER — ESCITALOPRAM OXALATE 5 MG PO TABS: 5.0000 mg | ORAL_TABLET | Freq: Every day | ORAL | 1 refills | Status: DC

## 2020-06-22 MED ORDER — ALPRAZOLAM 0.5 MG PO TABS: 0.5000 mg | ORAL_TABLET | Freq: Every evening | ORAL | 1 refills | Status: DC | PRN

## 2020-06-22 MED ORDER — VITAMIN D (ERGOCALCIFEROL) 1.25 MG (50000 UNIT) PO CAPS: ORAL_CAPSULE | ORAL | 0 refills | Status: DC

## 2020-06-22 NOTE — Progress Notes (Signed)
Subjective:    Patient ID: Holly Rodriguez, female    DOB: 02/20/1966, 54 y.o.   MRN: [id number]  Chief Complaint  Patient presents with  . Medical Management of Chronic Issues    6 mth, rakes patient   . Diabetes  . Fatigue  . Nausea    for a year    Pt presents to the office today to establish care with me. She is followed by Endocrinologists for DM. She reports over the last few months she has been fatigued and tired.  Diabetes She presents for her follow-up diabetic visit. She has type 2 diabetes mellitus. Her disease course has been stable. Hypoglycemia symptoms include nervousness/anxiousness. Pertinent negatives for diabetes include no blurred vision, no foot paresthesias and no visual change. Symptoms are stable. Diabetic complications include nephropathy. Pertinent negatives for diabetic complications include no CVA, heart disease or peripheral neuropathy. Risk factors for coronary artery disease include dyslipidemia, diabetes mellitus, hypertension, sedentary lifestyle and post-menopausal. She is following a generally healthy diet. Her overall blood glucose range is 110-130 mg/dl. An ACE inhibitor/angiotensin II receptor blocker is being taken.  Hypertension This is a chronic problem. The current episode started more than 1 year ago. The problem has been resolved since onset. The problem is controlled. Associated symptoms include anxiety and malaise/fatigue. Pertinent negatives include no blurred vision, peripheral edema or shortness of breath. Risk factors for coronary artery disease include diabetes mellitus. The current treatment provides moderate improvement. Hypertensive end-organ damage includes kidney disease. There is no history of CVA.  Hyperlipidemia This is a chronic problem. The current episode started more than 1 year ago. The problem is controlled. Recent lipid tests were reviewed and are normal. Exacerbating diseases include obesity. Pertinent negatives include no  shortness of breath. Current antihyperlipidemic treatment includes statins and fibric acid derivatives. The current treatment provides moderate improvement of lipids. Risk factors for coronary artery disease include dyslipidemia, diabetes mellitus, hypertension, a sedentary lifestyle and post-menopausal.  Anxiety Presents for follow-up visit. Symptoms include depressed mood, excessive worry, irritability, nervous/anxious behavior and restlessness. Patient reports no shortness of breath. Symptoms occur occasionally. The severity of symptoms is moderate. The quality of sleep is good.     OSA  Pt using CPAP nightly. Stable.    Review of Systems  Constitutional: Positive for irritability and malaise/fatigue.  Eyes: Negative for blurred vision.  Respiratory: Negative for shortness of breath.   Psychiatric/Behavioral: The patient is nervous/anxious.   All other systems reviewed and are negative.      Objective:   Physical Exam Vitals reviewed.  Constitutional:      General: She is not in acute distress.    Appearance: She is well-developed. She is obese.  HENT:     Head: Normocephalic and atraumatic.     Right Ear: Tympanic membrane normal.     Left Ear: Tympanic membrane normal.  Eyes:     Pupils: Pupils are equal, round, and reactive to light.  Neck:     Thyroid: No thyromegaly.  Cardiovascular:     Rate and Rhythm: Regular rhythm. Tachycardia present.     Heart sounds: Normal heart sounds. No murmur heard.   Pulmonary:     Effort: Pulmonary effort is normal. No respiratory distress.     Breath sounds: Normal breath sounds. No wheezing.  Abdominal:     General: Bowel sounds are normal. There is no distension.     Palpations: Abdomen is soft.     Tenderness: There is no abdominal tenderness.  Musculoskeletal:        General: No tenderness. Normal range of motion.     Cervical back: Normal range of motion and neck supple.  Skin:    General: Skin is warm and dry.   Neurological:     Mental Status: She is alert and oriented to person, place, and time.     Cranial Nerves: No cranial nerve deficit.     Deep Tendon Reflexes: Reflexes are normal and symmetric.  Psychiatric:        Behavior: Behavior normal.        Thought Content: Thought content normal.        Judgment: Judgment normal.     Diabetic Foot Exam - Simple   Simple Foot Form Diabetic Foot exam was performed with the following findings: Yes 06/22/2020 11:42 AM  Visual Inspection See comments: Yes Sensation Testing Intact to touch and monofilament testing bilaterally: Yes Pulse Check Posterior Tibialis and Dorsalis pulse intact bilaterally: Yes Comments Callus formation of base of great left toe.       BP 118/84   Pulse 77   Temp 97.6 F (36.4 C) (Temporal)   Ht 5' 2"  (1.575 m)   Wt 233 lb 9.6 oz (106 kg)   LMP 06/29/2017   SpO2 97%   BMI 42.73 kg/m      Assessment & Plan:  Holly Rodriguez comes in today with chief complaint of Medical Management of Chronic Issues (6 mth, rakes patient ), Diabetes, Fatigue, and Nausea (for a year )   Diagnosis and orders addressed:  1. Type 2 diabetes mellitus with hyperglycemia, without long-term current use of insulin (HCC) - Bayer DCA Hb A1c Waived - CMP14+EGFR - PR DIABETIC DELUXE SHOE  2. Anxiety state - ALPRAZolam (XANAX) 0.5 MG tablet; Take 1 tablet (0.5 mg total) by mouth at bedtime as needed for anxiety.  Dispense: 60 tablet; Refill: 1 - CMP14+EGFR  3. Hypertension associated with diabetes ([place]) - CMP14+EGFR  4. CKD stage 3 due to type 2 diabetes mellitus (HCC) - CMP14+EGFR  5. Hyperlipidemia associated with type 2 diabetes mellitus (HCC) - CMP14+EGFR  6. Thyroid disease - CMP14+EGFR - TSH  7. Acute gout involving toe of left foot, unspecified cause - CMP14+EGFR  8. GAD (generalized anxiety disorder) -Will start Lexapro 5 mg today Only take xanax as needed - CMP14+EGFR - escitalopram (LEXAPRO) 5 MG  tablet; Take 1 tablet (5 mg total) by mouth daily.  Dispense: 90 tablet; Refill: 1 - ToxASSURE Select 13 (MW), Urine  9. Morbid obesity ([place]) - CMP14+EGFR  10. Iron deficiency - CMP14+EGFR - Anemia Profile B  11. Hypertriglyceridemia - CMP14+EGFR  12. OSA (obstructive sleep apnea) - CMP14+EGFR  13. Vitamin D deficiency - Vitamin D, Ergocalciferol, (DRISDOL) 1.25 MG (50000 UNIT) CAPS capsule; TAKE 1 CAPSULE BY MOUTH  EVERY 7 DAYS  Dispense: 13 capsule; Refill: 0 - CMP14+EGFR - VITAMIN D 25 Hydroxy (Vit-D Deficiency, Fractures)  14. Fatigue, unspecified type - CMP14+EGFR - Anemia Profile B  15. Controlled substance agreement signed - ToxASSURE Select 13 ([place]), Urine  16. Need for hepatitis C screening test - Hepatitis C antibody   Labs pending Patient reviewed in [place] controlled database, no flags noted. Contract and drug screen are up dated today  Health Maintenance reviewed Diet and exercise encouraged  Follow up plan: 4 weeks to recheck GAD   Evelina Dun, FNP

## 2020-06-22 NOTE — Patient Instructions (Signed)

## 2020-06-23 LAB — ANEMIA PROFILE B
Basophils Absolute: 0.1 10*3/uL (ref 0.0–0.2)
Basos: 1 %
EOS (ABSOLUTE): 0.2 10*3/uL (ref 0.0–0.4)
Eos: 2 %
Ferritin: 36 ng/mL (ref 15–150)
Folate: 3.6 ng/mL (ref >3.0)
Hematocrit: 38.2 % (ref 34.0–46.6)
Hemoglobin: 12.6 g/dL (ref 11.1–15.9)
Immature Grans (Abs): 0 10*3/uL (ref 0.0–0.1)
Immature Granulocytes: 0 %
Iron Saturation: 17 % (ref 15–55)
Iron: 79 ug/dL (ref 27–159)
Lymphocytes Absolute: 3.8 10*3/uL — ABNORMAL HIGH (ref 0.7–3.1)
Lymphs: 43 %
MCH: 29.7 pg (ref 26.6–33.0)
MCHC: 33 g/dL (ref 31.5–35.7)
MCV: 90 fL (ref 79–97)
Monocytes Absolute: 0.5 10*3/uL (ref 0.1–0.9)
Monocytes: 6 %
Neutrophils Absolute: 4.2 10*3/uL (ref 1.4–7.0)
Neutrophils: 48 %
Platelets: 302 10*3/uL (ref 150–450)
RBC: 4.24 x10E6/uL (ref 3.77–5.28)
RDW: 14.6 % (ref 11.7–15.4)
Retic Ct Pct: 1.8 % (ref 0.6–2.6)
Total Iron Binding Capacity: 467 ug/dL — ABNORMAL HIGH (ref 250–450)
UIBC: 388 ug/dL (ref 131–425)
Vitamin B-12: 494 pg/mL (ref 232–1245)
WBC: 8.8 10*3/uL (ref 3.4–10.8)

## 2020-06-23 LAB — CMP14+EGFR
ALT: 22 IU/L (ref 0–32)
AST: 25 IU/L (ref 0–40)
Albumin/Globulin Ratio: 1.8 (ref 1.2–2.2)
Albumin: 4.5 g/dL (ref 3.8–4.9)
Alkaline Phosphatase: 68 IU/L (ref 48–121)
BUN/Creatinine Ratio: 23 (ref 9–23)
BUN: 35 mg/dL — ABNORMAL HIGH (ref 6–24)
Bilirubin Total: <0.2 mg/dL (ref 0.0–1.2)
CO2: 23 mmol/L (ref 20–29)
Calcium: 9.4 mg/dL (ref 8.7–10.2)
Chloride: 103 mmol/L (ref 96–106)
Creatinine, Ser: 1.49 mg/dL — ABNORMAL HIGH (ref 0.57–1.00)
GFR calc Af Amer: 46 mL/min/{1.73_m2} — ABNORMAL LOW (ref >59)
GFR calc non Af Amer: 40 mL/min/{1.73_m2} — ABNORMAL LOW (ref >59)
Globulin, Total: 2.5 g/dL (ref 1.5–4.5)
Glucose: 91 mg/dL (ref 65–99)
Potassium: 5.7 mmol/L — ABNORMAL HIGH (ref 3.5–5.2)
Sodium: 138 mmol/L (ref 134–144)
Total Protein: 7 g/dL (ref 6.0–8.5)

## 2020-06-23 LAB — HEPATITIS C ANTIBODY: Hep C Virus Ab: <0.1 s/co ratio (ref 0.0–0.9)

## 2020-06-23 LAB — TSH: TSH: 1.28 u[IU]/mL (ref 0.450–4.500)

## 2020-06-23 LAB — VITAMIN D 25 HYDROXY (VIT D DEFICIENCY, FRACTURES): Vit D, 25-Hydroxy: 31.5 ng/mL (ref 30.0–100.0)

## 2020-06-26 ENCOUNTER — Other Ambulatory Visit: Payer: Self-pay | Admitting: Family

## 2020-06-26 DIAGNOSIS — N1832 Chronic kidney disease, stage 3b: Secondary | ICD-10-CM | POA: Insufficient documentation

## 2020-06-26 LAB — TOXASSURE SELECT 13 (MW), URINE

## 2020-06-26 NOTE — Telephone Encounter (Signed)
Lab is checking on this

## 2020-06-27 ENCOUNTER — Other Ambulatory Visit: Payer: Self-pay | Admitting: Family

## 2020-06-27 LAB — SPECIMEN STATUS REPORT

## 2020-06-27 LAB — HGB A1C W/O EAG: Hgb A1c MFr Bld: 6.4 % — ABNORMAL HIGH (ref 4.8–5.6)

## 2020-07-17 DIAGNOSIS — E1122 Type 2 diabetes mellitus with diabetic chronic kidney disease: Secondary | ICD-10-CM | POA: Diagnosis not present

## 2020-07-17 DIAGNOSIS — I129 Hypertensive chronic kidney disease with stage 1 through stage 4 chronic kidney disease, or unspecified chronic kidney disease: Secondary | ICD-10-CM | POA: Diagnosis not present

## 2020-07-17 DIAGNOSIS — E875 Hyperkalemia: Secondary | ICD-10-CM | POA: Diagnosis not present

## 2020-07-17 DIAGNOSIS — N1832 Chronic kidney disease, stage 3b: Secondary | ICD-10-CM | POA: Diagnosis not present

## 2020-07-17 DIAGNOSIS — G4733 Obstructive sleep apnea (adult) (pediatric): Secondary | ICD-10-CM | POA: Diagnosis not present

## 2020-07-18 ENCOUNTER — Other Ambulatory Visit: Payer: Self-pay | Admitting: Nephrology

## 2020-07-19 ENCOUNTER — Other Ambulatory Visit: Payer: Self-pay | Admitting: Nephrology

## 2020-07-19 DIAGNOSIS — N1832 Chronic kidney disease, stage 3b: Secondary | ICD-10-CM

## 2020-07-20 ENCOUNTER — Ambulatory Visit: Payer: BC Managed Care – PPO | Admitting: Family

## 2020-07-26 ENCOUNTER — Ambulatory Visit
Admission: RE | Admit: 2020-07-26 | Discharge: 2020-07-26 | Disposition: A | Discharge status: Home / Self Care | Payer: BC Managed Care – PPO | Source: Ambulatory Visit | Attending: Nephrology | Admitting: Nephrology

## 2020-07-26 DIAGNOSIS — N189 Chronic kidney disease, unspecified: Secondary | ICD-10-CM | POA: Diagnosis not present

## 2020-07-26 DIAGNOSIS — N1832 Chronic kidney disease, stage 3b: Secondary | ICD-10-CM

## 2020-08-03 ENCOUNTER — Ambulatory Visit: Payer: BC Managed Care – PPO | Admitting: Family

## 2020-08-08 ENCOUNTER — Ambulatory Visit: Payer: BC Managed Care – PPO | Admitting: Family

## 2020-08-09 ENCOUNTER — Other Ambulatory Visit: Payer: Self-pay

## 2020-08-09 ENCOUNTER — Ambulatory Visit (INDEPENDENT_AMBULATORY_CARE_PROVIDER_SITE_OTHER): Payer: BC Managed Care – PPO

## 2020-08-09 DIAGNOSIS — Z23 Encounter for immunization: Secondary | ICD-10-CM | POA: Diagnosis not present

## 2020-08-09 DIAGNOSIS — E1165 Type 2 diabetes mellitus with hyperglycemia: Secondary | ICD-10-CM

## 2020-08-09 MED ORDER — OZEMPIC (0.25 OR 0.5 MG/DOSE) 2 MG/1.5ML ~~LOC~~ SOPN: 0.5000 mg | PEN_INJECTOR | SUBCUTANEOUS | 2 refills | Status: DC

## 2020-08-09 NOTE — Progress Notes (Signed)
   Covid-19 Vaccination Clinic  Name:  Holly Rodriguez    MRN: [id number] DOB: 16-Dec-1965  08/09/2020  [name] was observed post Covid-19 immunization for 15 minutes without incident. She was provided with Vaccine Information Sheet and instruction to access the V-Safe system.   [name] was instructed to call 911 with any severe reactions post vaccine: Marland Kitchen Difficulty breathing  . Swelling of face and throat  . A fast heartbeat  . A bad rash all over body  . Dizziness and weakness   Immunizations Administered    Name Date Dose VIS Date Route   Pfizer COVID-19 Vaccine 08/09/2020  3:43 PM 0.3 mL 01/12/2019 Intramuscular   Manufacturer: [place]   Lot: [id number]   [place]: [id number]

## 2020-08-09 NOTE — Addendum Note (Signed)
Addended by: Zannie Cove on: 08/09/2020 03:49 PM   Modules accepted: Orders

## 2020-08-17 DIAGNOSIS — G4733 Obstructive sleep apnea (adult) (pediatric): Secondary | ICD-10-CM | POA: Diagnosis not present

## 2020-09-01 ENCOUNTER — Other Ambulatory Visit: Payer: Self-pay | Admitting: Family

## 2020-09-01 DIAGNOSIS — E559 Vitamin D deficiency, unspecified: Secondary | ICD-10-CM

## 2020-09-04 ENCOUNTER — Other Ambulatory Visit: Payer: BC Managed Care – PPO

## 2020-09-04 DIAGNOSIS — Z20822 Contact with and (suspected) exposure to covid-19: Secondary | ICD-10-CM | POA: Diagnosis not present

## 2020-09-05 ENCOUNTER — Other Ambulatory Visit: Payer: Self-pay | Admitting: Family

## 2020-09-05 DIAGNOSIS — E559 Vitamin D deficiency, unspecified: Secondary | ICD-10-CM

## 2020-09-05 LAB — NOVEL CORONAVIRUS, NAA: SARS-CoV-2, NAA: NOT DETECTED

## 2020-09-05 LAB — SARS-COV-2, NAA 2 DAY TAT

## 2020-09-06 ENCOUNTER — Ambulatory Visit: Payer: BC Managed Care – PPO

## 2020-09-06 ENCOUNTER — Telehealth: Payer: Self-pay

## 2020-09-06 NOTE — Telephone Encounter (Signed)
Pt is supposed to get her 2nd COVID Vaccine today but wants to make sure it is ok to get it. Says she was sick between Oct 1-8 but was not tested for COVID, but then people in her family got sick after that and did test positive for COVID.   Should pt wait to get her 2nd shot or is she ok to do so today?

## 2020-09-06 NOTE — Telephone Encounter (Signed)
I do think that it is likely that she had Covid and I would wait at least 30 days from when she was ill, I think she should wait at least until after the first week of November

## 2020-09-16 DIAGNOSIS — G4733 Obstructive sleep apnea (adult) (pediatric): Secondary | ICD-10-CM | POA: Diagnosis not present

## 2020-10-11 ENCOUNTER — Telehealth: Payer: Self-pay

## 2020-10-11 ENCOUNTER — Ambulatory Visit: Payer: BC Managed Care – PPO

## 2020-10-11 NOTE — Telephone Encounter (Signed)
Pt called - appt

## 2020-10-17 DIAGNOSIS — N183 Chronic kidney disease, stage 3 unspecified: Secondary | ICD-10-CM | POA: Diagnosis not present

## 2020-10-17 DIAGNOSIS — E1122 Type 2 diabetes mellitus with diabetic chronic kidney disease: Secondary | ICD-10-CM | POA: Diagnosis not present

## 2020-10-17 DIAGNOSIS — G4733 Obstructive sleep apnea (adult) (pediatric): Secondary | ICD-10-CM | POA: Diagnosis not present

## 2020-10-18 ENCOUNTER — Ambulatory Visit: Payer: BC Managed Care – PPO

## 2020-10-24 DIAGNOSIS — I129 Hypertensive chronic kidney disease with stage 1 through stage 4 chronic kidney disease, or unspecified chronic kidney disease: Secondary | ICD-10-CM | POA: Diagnosis not present

## 2020-10-24 DIAGNOSIS — E875 Hyperkalemia: Secondary | ICD-10-CM | POA: Diagnosis not present

## 2020-10-24 DIAGNOSIS — E1122 Type 2 diabetes mellitus with diabetic chronic kidney disease: Secondary | ICD-10-CM | POA: Diagnosis not present

## 2020-10-24 DIAGNOSIS — N1832 Chronic kidney disease, stage 3b: Secondary | ICD-10-CM | POA: Diagnosis not present

## 2020-11-16 DIAGNOSIS — L570 Actinic keratosis: Secondary | ICD-10-CM | POA: Diagnosis not present

## 2020-11-16 DIAGNOSIS — D225 Melanocytic nevi of trunk: Secondary | ICD-10-CM | POA: Diagnosis not present

## 2020-11-16 DIAGNOSIS — L814 Other melanin hyperpigmentation: Secondary | ICD-10-CM | POA: Diagnosis not present

## 2020-11-16 DIAGNOSIS — L821 Other seborrheic keratosis: Secondary | ICD-10-CM | POA: Diagnosis not present

## 2020-11-29 ENCOUNTER — Other Ambulatory Visit: Payer: Self-pay

## 2020-11-29 ENCOUNTER — Ambulatory Visit: Payer: BC Managed Care – PPO

## 2020-11-29 DIAGNOSIS — Z23 Encounter for immunization: Secondary | ICD-10-CM | POA: Diagnosis not present

## 2020-11-29 NOTE — Progress Notes (Signed)
   Covid-19 Vaccination Clinic  Name:  Holly Rodriguez    MRN: [id number] DOB: 14-Sep-1966  11/29/2020  Holly Rodriguez was observed post Covid-19 immunization for 15 minutes without incident. She was provided with Vaccine Information Sheet and instruction to access the V-Safe system.   Holly Rodriguez was instructed to call 911 with any severe reactions post vaccine: Marland Kitchen Difficulty breathing  . Swelling of face and throat  . A fast heartbeat  . A bad rash all over body  . Dizziness and weakness   Immunizations Administered    Name Date Dose VIS Date Route   Pfizer COVID-19 Vaccine 11/29/2020  3:10 PM 0.3 mL 09/06/2020 Intramuscular   Manufacturer: [place]   Lot: [id number]   [place]: [id number]

## 2020-12-13 ENCOUNTER — Other Ambulatory Visit: Payer: Self-pay | Admitting: Family

## 2020-12-13 DIAGNOSIS — M109 Gout, unspecified: Secondary | ICD-10-CM

## 2020-12-13 NOTE — Telephone Encounter (Signed)
[facility] 6 mos ckup to be February. Mail order sent

## 2020-12-14 ENCOUNTER — Other Ambulatory Visit: Payer: Self-pay | Admitting: *Deleted

## 2020-12-14 DIAGNOSIS — E785 Hyperlipidemia, unspecified: Secondary | ICD-10-CM

## 2020-12-14 DIAGNOSIS — E1169 Type 2 diabetes mellitus with other specified complication: Secondary | ICD-10-CM

## 2020-12-14 MED ORDER — FENOFIBRATE 160 MG PO TABS
160.0000 mg | ORAL_TABLET | Freq: Every day | ORAL | 0 refills | Status: DC
Start: 2020-12-14 — End: 2021-03-13

## 2020-12-17 DIAGNOSIS — G4733 Obstructive sleep apnea (adult) (pediatric): Secondary | ICD-10-CM | POA: Diagnosis not present

## 2021-02-14 DIAGNOSIS — G4733 Obstructive sleep apnea (adult) (pediatric): Secondary | ICD-10-CM | POA: Diagnosis not present

## 2021-02-22 ENCOUNTER — Other Ambulatory Visit: Payer: Self-pay | Admitting: Family

## 2021-02-22 DIAGNOSIS — M109 Gout, unspecified: Secondary | ICD-10-CM

## 2021-02-22 NOTE — Telephone Encounter (Signed)
[facility] NTBS mail order not sent

## 2021-02-23 ENCOUNTER — Other Ambulatory Visit: Payer: Self-pay | Admitting: Family

## 2021-02-23 DIAGNOSIS — E785 Hyperlipidemia, unspecified: Secondary | ICD-10-CM

## 2021-02-23 DIAGNOSIS — E1169 Type 2 diabetes mellitus with other specified complication: Secondary | ICD-10-CM

## 2021-03-12 ENCOUNTER — Other Ambulatory Visit: Payer: Self-pay | Admitting: Family

## 2021-03-12 DIAGNOSIS — M109 Gout, unspecified: Secondary | ICD-10-CM

## 2021-03-12 DIAGNOSIS — E1169 Type 2 diabetes mellitus with other specified complication: Secondary | ICD-10-CM

## 2021-03-12 DIAGNOSIS — E785 Hyperlipidemia, unspecified: Secondary | ICD-10-CM

## 2021-03-12 DIAGNOSIS — E079 Disorder of thyroid, unspecified: Secondary | ICD-10-CM

## 2021-03-12 MED ORDER — LEVOTHYROXINE SODIUM 75 MCG PO TABS: ORAL_TABLET | ORAL | 0 refills | Status: DC

## 2021-03-12 NOTE — Telephone Encounter (Signed)
[facility] NTBS 6 mos ckup was to be February mail order not sent

## 2021-03-12 NOTE — Addendum Note (Signed)
Addended by: Antonietta Barcelona D on: 03/12/2021 04:06 PM   Modules accepted: Orders

## 2021-03-13 DIAGNOSIS — E1165 Type 2 diabetes mellitus with hyperglycemia: Secondary | ICD-10-CM

## 2021-03-13 DIAGNOSIS — E785 Hyperlipidemia, unspecified: Secondary | ICD-10-CM

## 2021-03-13 DIAGNOSIS — E1169 Type 2 diabetes mellitus with other specified complication: Secondary | ICD-10-CM

## 2021-03-13 DIAGNOSIS — M109 Gout, unspecified: Secondary | ICD-10-CM

## 2021-03-13 MED ORDER — FENOFIBRATE 160 MG PO TABS: 160.0000 mg | ORAL_TABLET | Freq: Every day | ORAL | 0 refills | Status: DC

## 2021-03-13 MED ORDER — ALLOPURINOL 300 MG PO TABS: ORAL_TABLET | ORAL | 0 refills | Status: DC

## 2021-03-13 MED ORDER — OMEPRAZOLE 40 MG PO CPDR: 40.0000 mg | DELAYED_RELEASE_CAPSULE | Freq: Every day | ORAL | 0 refills | Status: DC

## 2021-03-13 MED ORDER — OZEMPIC (0.25 OR 0.5 MG/DOSE) 2 MG/1.5ML ~~LOC~~ SOPN: 0.5000 mg | PEN_INJECTOR | SUBCUTANEOUS | 0 refills | Status: DC

## 2021-03-17 DIAGNOSIS — G4733 Obstructive sleep apnea (adult) (pediatric): Secondary | ICD-10-CM | POA: Diagnosis not present

## 2021-03-27 ENCOUNTER — Other Ambulatory Visit: Payer: Self-pay | Admitting: Family

## 2021-03-30 ENCOUNTER — Ambulatory Visit: Payer: BC Managed Care – PPO | Admitting: Family

## 2021-04-05 ENCOUNTER — Other Ambulatory Visit: Payer: Self-pay

## 2021-04-05 ENCOUNTER — Encounter: Payer: Self-pay | Admitting: Family

## 2021-04-05 ENCOUNTER — Ambulatory Visit: Payer: BC Managed Care – PPO | Admitting: Family

## 2021-04-05 VITALS — BP 132/89 | HR 74 | Temp 97.6°F | Ht 62.0 in | Wt 241.4 lb

## 2021-04-05 DIAGNOSIS — E1165 Type 2 diabetes mellitus with hyperglycemia: Secondary | ICD-10-CM | POA: Diagnosis not present

## 2021-04-05 DIAGNOSIS — E785 Hyperlipidemia, unspecified: Secondary | ICD-10-CM

## 2021-04-05 DIAGNOSIS — N183 Chronic kidney disease, stage 3 unspecified: Secondary | ICD-10-CM

## 2021-04-05 DIAGNOSIS — Z23 Encounter for immunization: Secondary | ICD-10-CM | POA: Diagnosis not present

## 2021-04-05 DIAGNOSIS — E079 Disorder of thyroid, unspecified: Secondary | ICD-10-CM

## 2021-04-05 DIAGNOSIS — G4733 Obstructive sleep apnea (adult) (pediatric): Secondary | ICD-10-CM

## 2021-04-05 DIAGNOSIS — Z114 Encounter for screening for human immunodeficiency virus [HIV]: Secondary | ICD-10-CM

## 2021-04-05 DIAGNOSIS — F411 Generalized anxiety disorder: Secondary | ICD-10-CM

## 2021-04-05 DIAGNOSIS — E1169 Type 2 diabetes mellitus with other specified complication: Secondary | ICD-10-CM

## 2021-04-05 DIAGNOSIS — I152 Hypertension secondary to endocrine disorders: Secondary | ICD-10-CM

## 2021-04-05 DIAGNOSIS — E559 Vitamin D deficiency, unspecified: Secondary | ICD-10-CM

## 2021-04-05 DIAGNOSIS — N1832 Chronic kidney disease, stage 3b: Secondary | ICD-10-CM

## 2021-04-05 DIAGNOSIS — E1159 Type 2 diabetes mellitus with other circulatory complications: Secondary | ICD-10-CM

## 2021-04-05 DIAGNOSIS — E1122 Type 2 diabetes mellitus with diabetic chronic kidney disease: Secondary | ICD-10-CM

## 2021-04-05 DIAGNOSIS — E611 Iron deficiency: Secondary | ICD-10-CM

## 2021-04-05 DIAGNOSIS — Z79899 Other long term (current) drug therapy: Secondary | ICD-10-CM

## 2021-04-05 LAB — BAYER DCA HB A1C WAIVED: HB A1C (BAYER DCA - WAIVED): 7 % — ABNORMAL HIGH (ref <7.0)

## 2021-04-05 MED ORDER — ALPRAZOLAM 0.5 MG PO TABS: 0.5000 mg | ORAL_TABLET | Freq: Every evening | ORAL | 1 refills | Status: DC | PRN

## 2021-04-05 NOTE — Progress Notes (Signed)
Subjective:    Patient ID: Holly Rodriguez, female    DOB: Jan 09, 1966, 55 y.o.   MRN: [id number]  Chief Complaint  Patient presents with  . Diabetes  . Generalized Body Aches    Running after 55year old    Pt presents to the office today for chronic follow up.  She is followed by Endocrinologists for DM. She reports over the last few months she has been fatigued and tired. She is watching her 83 year old grandbaby.  Diabetes She presents for her follow-up diabetic visit. She has type 2 diabetes mellitus. Her disease course has been stable. Pertinent negatives for diabetes include no blurred vision and no foot paresthesias. Symptoms are stable. Diabetic complications include heart disease. Risk factors for coronary artery disease include dyslipidemia, diabetes mellitus, hypertension, sedentary lifestyle and post-menopausal. (Does not check them ) An ACE inhibitor/angiotensin II receptor blocker is not being taken. Eye exam is not current.  Hypertension This is a chronic problem. The current episode started more than 1 year ago. The problem is controlled. Associated symptoms include anxiety, malaise/fatigue and peripheral edema. Pertinent negatives include no blurred vision or shortness of breath. Risk factors for coronary artery disease include dyslipidemia, obesity and sedentary lifestyle. The current treatment provides moderate improvement. There is no history of heart failure.  Hyperlipidemia This is a chronic problem. The current episode started more than 1 year ago. Exacerbating diseases include obesity. Pertinent negatives include no shortness of breath. Current antihyperlipidemic treatment includes fibric acid derivatives. The current treatment provides moderate improvement of lipids. Risk factors for coronary artery disease include dyslipidemia, diabetes mellitus, hypertension, a sedentary lifestyle and post-menopausal.  Anemia Presents for follow-up visit. Symptoms include  malaise/fatigue. There has been no bruising/bleeding easily or leg swelling. There is no history of heart failure.  Anxiety Presents for follow-up visit. Symptoms include depressed mood, irritability and restlessness. Patient reports no shortness of breath. Symptoms occur most days. The severity of symptoms is moderate.   Her past medical history is significant for anemia.  OSA Using CPAP nightly. Stable.     Review of Systems  Constitutional: Positive for irritability and malaise/fatigue.  Eyes: Negative for blurred vision.  Respiratory: Negative for shortness of breath.   Hematological: Does not bruise/bleed easily.  All other systems reviewed and are negative.      Objective:   Physical Exam Vitals reviewed.  Constitutional:      General: She is not in acute distress.    Appearance: She is well-developed.  HENT:     Head: Normocephalic and atraumatic.     Right Ear: Tympanic membrane normal.     Left Ear: Tympanic membrane normal.  Eyes:     Pupils: Pupils are equal, round, and reactive to light.  Neck:     Thyroid: No thyromegaly.  Cardiovascular:     Rate and Rhythm: Normal rate and regular rhythm.     Heart sounds: Normal heart sounds. No murmur heard.   Pulmonary:     Effort: Pulmonary effort is normal. No respiratory distress.     Breath sounds: Wheezing present.  Abdominal:     General: Bowel sounds are normal. There is no distension.     Palpations: Abdomen is soft.     Tenderness: There is no abdominal tenderness.  Musculoskeletal:        General: No tenderness. Normal range of motion.     Cervical back: Normal range of motion and neck supple.  Skin:    General: Skin is  warm and dry.  Neurological:     Mental Status: She is alert and oriented to person, place, and time.     Cranial Nerves: No cranial nerve deficit.     Deep Tendon Reflexes: Reflexes are normal and symmetric.  Psychiatric:        Behavior: Behavior normal.        Thought Content:  Thought content normal.        Judgment: Judgment normal.       BP 132/89   Pulse 74   Temp 97.6 F (36.4 C) (Temporal)   Ht 5' 2" (1.575 m)   Wt 241 lb 6.4 oz (109.5 kg)   LMP 06/29/2017   BMI 44.15 kg/m      Assessment & Plan:  Holly Rodriguez comes in today with chief complaint of Diabetes and Generalized Body Aches (Running after 55year old )   Diagnosis and orders addressed:  1. Hypertension associated with diabetes ([place]) - CMP14+EGFR - CBC with Differential/Platelet  2. OSA (obstructive sleep apnea) - CMP14+EGFR - CBC with Differential/Platelet  3. Type 2 diabetes mellitus with hyperglycemia, without long-term current use of insulin ([facility]) - CMP14+EGFR - CBC with Differential/Platelet - Bayer DCA Hb A1c Waived - Microalbumin / creatinine urine ratio  4. Thyroid disease - CMP14+EGFR - CBC with Differential/Platelet - TSH  5. Hyperlipidemia associated with type 2 diabetes mellitus ([facility]) - CMP14+EGFR - CBC with Differential/Platelet  6. CKD stage 3 due to type 2 diabetes mellitus ([facility]) - CMP14+EGFR - CBC with Differential/Platelet  7. Stage 3b chronic kidney disease ([place]) - CMP14+EGFR - CBC with Differential/Platelet  8. Vitamin D deficiency - CMP14+EGFR - CBC with Differential/Platelet  9. Morbid obesity ([place]) - CMP14+EGFR - CBC with Differential/Platelet  10. Iron deficiency - CMP14+EGFR - CBC with Differential/Platelet  11. GAD (generalized anxiety disorder) - CMP14+EGFR - CBC with Differential/Platelet  12. Controlled substance agreement signed - CMP14+EGFR - CBC with Differential/Platelet  13. Encounter for screening for HIV - HIV Antibody (routine testing w rflx) - CMP14+EGFR - CBC with Differential/Platelet   Labs pending Patient reviewed in [place] controlled database, no flags noted. Contract and drug screen are up to date. Health Maintenance reviewed Diet and exercise encouraged  Follow up plan: 3 months   Evelina Dun, FNP

## 2021-04-05 NOTE — Patient Instructions (Signed)
Diabetes Mellitus and Nutrition, Adult When you have diabetes, or diabetes mellitus, it is very important to have healthy eating habits because your blood sugar (glucose) levels are greatly affected by what you eat and drink. Eating healthy foods in the right amounts, at about the same times every day, can help you:  Control your blood glucose.  Lower your risk of heart disease.  Improve your blood pressure.  Reach or maintain a healthy weight. What can affect my meal plan? Every person with diabetes is different, and each person has different needs for a meal plan. Your health care provider may recommend that you work with a dietitian to make a meal plan that is best for you. Your meal plan may vary depending on factors such as:  The calories you need.  The medicines you take.  Your weight.  Your blood glucose, blood pressure, and cholesterol levels.  Your activity level.  Other health conditions you have, such as heart or kidney disease. How do carbohydrates affect me? Carbohydrates, also called carbs, affect your blood glucose level more than any other type of food. Eating carbs naturally raises the amount of glucose in your blood. Carb counting is a method for keeping track of how many carbs you eat. Counting carbs is important to keep your blood glucose at a healthy level, especially if you use insulin or take certain oral diabetes medicines. It is important to know how many carbs you can safely have in each meal. This is different for every person. Your dietitian can help you calculate how many carbs you should have at each meal and for each snack. How does alcohol affect me? Alcohol can cause a sudden decrease in blood glucose (hypoglycemia), especially if you use insulin or take certain oral diabetes medicines. Hypoglycemia can be a life-threatening condition. Symptoms of hypoglycemia, such as sleepiness, dizziness, and confusion, are similar to symptoms of having too much  alcohol.  Do not drink alcohol if: ? Your health care provider tells you not to drink. ? You are pregnant, may be pregnant, or are planning to become pregnant.  If you drink alcohol: ? Do not drink on an empty stomach. ? Limit how much you use to:  0-1 drink a day for women.  0-2 drinks a day for men. ? Be aware of how much alcohol is in your drink. In the [place] one drink equals one 12 oz bottle of beer (355 mL), one 5 oz glass of wine (148 mL), or one 1 oz glass of hard liquor (44 mL). ? Keep yourself hydrated with water, diet soda, or unsweetened iced tea.  Keep in mind that regular soda, juice, and other mixers may contain a lot of sugar and must be counted as carbs. What are tips for following this plan? Reading food labels  Start by checking the serving size on the "Nutrition Facts" label of packaged foods and drinks. The amount of calories, carbs, fats, and other nutrients listed on the label is based on one serving of the item. Many items contain more than one serving per package.  Check the total grams (g) of carbs in one serving. You can calculate the number of servings of carbs in one serving by dividing the total carbs by 15. For example, if a food has 30 g of total carbs per serving, it would be equal to 2 servings of carbs.  Check the number of grams (g) of saturated fats and trans fats in one serving. Choose foods that have   a low amount or none of these fats.  Check the number of milligrams (mg) of salt (sodium) in one serving. Most people should limit total sodium intake to less than 2,300 mg per day.  Always check the nutrition information of foods labeled as "low-fat" or "nonfat." These foods may be higher in added sugar or refined carbs and should be avoided.  Talk to your dietitian to identify your daily goals for nutrients listed on the label. Shopping  Avoid buying canned, pre-made, or processed foods. These foods tend to be high in fat, sodium, and added  sugar.  Shop around the outside edge of the grocery store. This is where you will most often find fresh fruits and vegetables, bulk grains, fresh meats, and fresh dairy. Cooking  Use low-heat cooking methods, such as baking, instead of high-heat cooking methods like deep frying.  Cook using healthy oils, such as olive, canola, or sunflower oil.  Avoid cooking with butter, cream, or high-fat meats. Meal planning  Eat meals and snacks regularly, preferably at the same times every day. Avoid going long periods of time without eating.  Eat foods that are high in fiber, such as fresh fruits, vegetables, beans, and whole grains. Talk with your dietitian about how many servings of carbs you can eat at each meal.  Eat 4-6 oz (112-168 g) of lean protein each day, such as lean meat, chicken, fish, eggs, or tofu. One ounce (oz) of lean protein is equal to: ? 1 oz (28 g) of meat, chicken, or fish. ? 1 egg. ?  cup (62 g) of tofu.  Eat some foods each day that contain healthy fats, such as avocado, nuts, seeds, and fish.   What foods should I eat? Fruits Berries. Apples. Oranges. Peaches. Apricots. Plums. Grapes. Mango. Papaya. Pomegranate. Kiwi. Cherries. Vegetables Lettuce. Spinach. Leafy greens, including kale, chard, collard greens, and mustard greens. Beets. Cauliflower. Cabbage. Broccoli. Carrots. Green beans. Tomatoes. Peppers. Onions. Cucumbers. Brussels sprouts. Grains Whole grains, such as whole-wheat or whole-grain bread, crackers, tortillas, cereal, and pasta. Unsweetened oatmeal. Quinoa. Brown or wild rice. Meats and other proteins Seafood. Poultry without skin. Lean cuts of poultry and beef. Tofu. Nuts. Seeds. Dairy Low-fat or fat-free dairy products such as milk, yogurt, and cheese. The items listed above may not be a complete list of foods and beverages you can eat. Contact a dietitian for more information. What foods should I avoid? Fruits Fruits canned with  syrup. Vegetables Canned vegetables. Frozen vegetables with butter or cream sauce. Grains Refined white flour and flour products such as bread, pasta, snack foods, and cereals. Avoid all processed foods. Meats and other proteins Fatty cuts of meat. Poultry with skin. Breaded or fried meats. Processed meat. Avoid saturated fats. Dairy Full-fat yogurt, cheese, or milk. Beverages Sweetened drinks, such as soda or iced tea. The items listed above may not be a complete list of foods and beverages you should avoid. Contact a dietitian for more information. Questions to ask a health care provider  Do I need to meet with a diabetes educator?  Do I need to meet with a dietitian?  What number can I call if I have questions?  When are the best times to check my blood glucose? Where to find more information:  American Diabetes Association: diabetes.org  Academy of Nutrition and Dietetics: www.eatright.org  National Institute of Diabetes and Digestive and Kidney Diseases: www.niddk.nih.gov  Association of Diabetes Care and Education Specialists: www.diabeteseducator.org Summary  It is important to have healthy eating   habits because your blood sugar (glucose) levels are greatly affected by what you eat and drink.  A healthy meal plan will help you control your blood glucose and maintain a healthy lifestyle.  Your health care provider may recommend that you work with a dietitian to make a meal plan that is best for you.  Keep in mind that carbohydrates (carbs) and alcohol have immediate effects on your blood glucose levels. It is important to count carbs and to use alcohol carefully. This information is not intended to replace advice given to you by your health care provider. Make sure you discuss any questions you have with your health care provider. Document Revised: 10/12/2019 Document Reviewed: 10/12/2019 Elsevier Patient Education  2021 Elsevier Inc.  

## 2021-04-06 LAB — CBC WITH DIFFERENTIAL/PLATELET
Basophils Absolute: 0.1 10*3/uL (ref 0.0–0.2)
Basos: 1 %
EOS (ABSOLUTE): 0.3 10*3/uL (ref 0.0–0.4)
Eos: 3 %
Hematocrit: 41.4 % (ref 34.0–46.6)
Hemoglobin: 14 g/dL (ref 11.1–15.9)
Immature Grans (Abs): 0 10*3/uL (ref 0.0–0.1)
Immature Granulocytes: 0 %
Lymphocytes Absolute: 4 10*3/uL — ABNORMAL HIGH (ref 0.7–3.1)
Lymphs: 39 %
MCH: 28.2 pg (ref 26.6–33.0)
MCHC: 33.8 g/dL (ref 31.5–35.7)
MCV: 83 fL (ref 79–97)
Monocytes Absolute: 0.7 10*3/uL (ref 0.1–0.9)
Monocytes: 7 %
Neutrophils Absolute: 5.2 10*3/uL (ref 1.4–7.0)
Neutrophils: 50 %
Platelets: 282 10*3/uL (ref 150–450)
RBC: 4.97 x10E6/uL (ref 3.77–5.28)
RDW: 16 % — ABNORMAL HIGH (ref 11.7–15.4)
WBC: 10.1 10*3/uL (ref 3.4–10.8)

## 2021-04-06 LAB — HIV ANTIBODY (ROUTINE TESTING W REFLEX): HIV Screen 4th Generation wRfx: NONREACTIVE

## 2021-04-06 LAB — CMP14+EGFR
ALT: 20 IU/L (ref 0–32)
AST: 26 IU/L (ref 0–40)
Albumin/Globulin Ratio: 1.7 (ref 1.2–2.2)
Albumin: 4.5 g/dL (ref 3.8–4.9)
Alkaline Phosphatase: 89 IU/L (ref 44–121)
BUN/Creatinine Ratio: 22 (ref 9–23)
BUN: 22 mg/dL (ref 6–24)
Bilirubin Total: <0.2 mg/dL (ref 0.0–1.2)
CO2: 20 mmol/L (ref 20–29)
Calcium: 9.6 mg/dL (ref 8.7–10.2)
Chloride: 104 mmol/L (ref 96–106)
Creatinine, Ser: 1.02 mg/dL — ABNORMAL HIGH (ref 0.57–1.00)
Globulin, Total: 2.7 g/dL (ref 1.5–4.5)
Glucose: 107 mg/dL — ABNORMAL HIGH (ref 65–99)
Potassium: 4.3 mmol/L (ref 3.5–5.2)
Sodium: 142 mmol/L (ref 134–144)
Total Protein: 7.2 g/dL (ref 6.0–8.5)
eGFR: 65 mL/min/{1.73_m2} (ref >59)

## 2021-04-06 LAB — MICROALBUMIN / CREATININE URINE RATIO
Creatinine, Urine: 119.5 mg/dL
Microalb/Creat Ratio: 22 mg/g creat (ref 0–29)
Microalbumin, Urine: 26.4 ug/mL

## 2021-04-06 LAB — TSH: TSH: 2.38 u[IU]/mL (ref 0.450–4.500)

## 2021-04-17 ENCOUNTER — Other Ambulatory Visit: Payer: Self-pay

## 2021-04-17 DIAGNOSIS — E1165 Type 2 diabetes mellitus with hyperglycemia: Secondary | ICD-10-CM

## 2021-04-17 DIAGNOSIS — N183 Chronic kidney disease, stage 3 unspecified: Secondary | ICD-10-CM | POA: Diagnosis not present

## 2021-04-17 DIAGNOSIS — E079 Disorder of thyroid, unspecified: Secondary | ICD-10-CM

## 2021-04-17 DIAGNOSIS — Z7984 Long term (current) use of oral hypoglycemic drugs: Secondary | ICD-10-CM | POA: Diagnosis not present

## 2021-04-17 DIAGNOSIS — E1169 Type 2 diabetes mellitus with other specified complication: Secondary | ICD-10-CM

## 2021-04-17 DIAGNOSIS — E785 Hyperlipidemia, unspecified: Secondary | ICD-10-CM

## 2021-04-17 DIAGNOSIS — E1122 Type 2 diabetes mellitus with diabetic chronic kidney disease: Secondary | ICD-10-CM | POA: Diagnosis not present

## 2021-04-17 DIAGNOSIS — M109 Gout, unspecified: Secondary | ICD-10-CM

## 2021-04-17 MED ORDER — METOPROLOL SUCCINATE ER 25 MG PO TB24: 1.0000 | ORAL_TABLET | Freq: Every day | ORAL | 1 refills | Status: DC

## 2021-04-17 MED ORDER — ALLOPURINOL 300 MG PO TABS: ORAL_TABLET | ORAL | 0 refills | Status: DC

## 2021-04-17 MED ORDER — SYNJARDY 12.5-1000 MG PO TABS: 1.0000 | ORAL_TABLET | Freq: Two times a day (BID) | ORAL | 1 refills | Status: DC

## 2021-04-17 MED ORDER — FENOFIBRATE 160 MG PO TABS: 160.0000 mg | ORAL_TABLET | Freq: Every day | ORAL | 0 refills | Status: DC

## 2021-04-17 MED ORDER — OMEPRAZOLE 40 MG PO CPDR: 40.0000 mg | DELAYED_RELEASE_CAPSULE | Freq: Every day | ORAL | 1 refills | Status: DC

## 2021-04-17 MED ORDER — LEVOTHYROXINE SODIUM 75 MCG PO TABS: ORAL_TABLET | ORAL | 1 refills | Status: DC

## 2021-04-20 IMAGING — US US RENAL
1 series · 14 of 25 positions shown · non-contrast
Comparison: None.

CLINICAL DATA: Chronic renal disease.

EXAM:
RENAL / URINARY TRACT ULTRASOUND COMPLETE

[Series 1: us renal · 0.23mm/px · 14 of 30 slices shown]
[im 1/30]
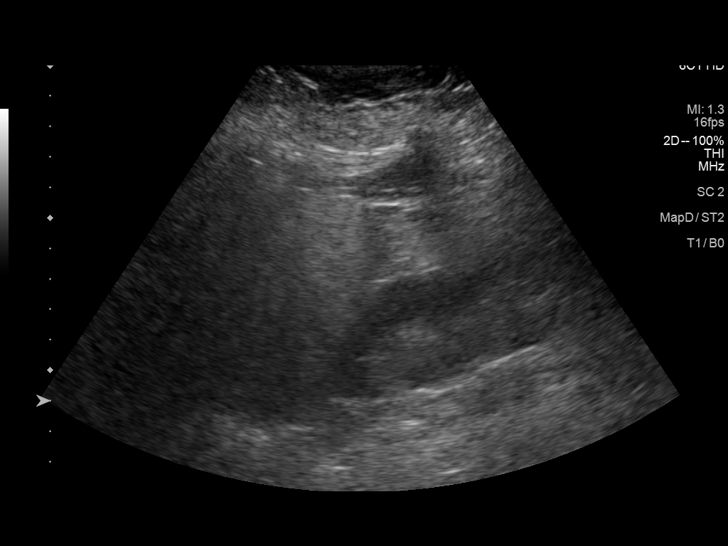
[im 3/30]
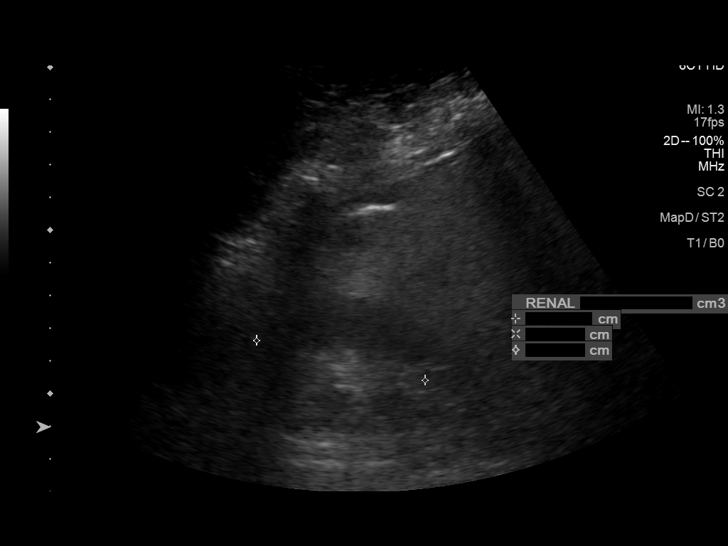
[im 5/30]
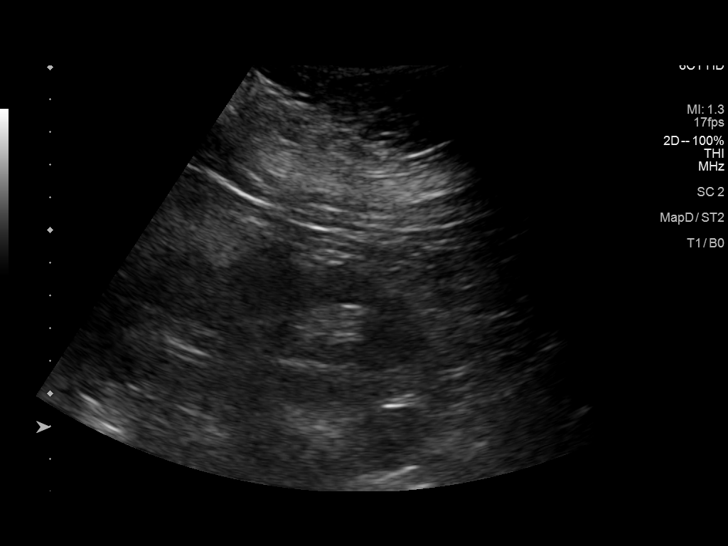
[im 8/30]
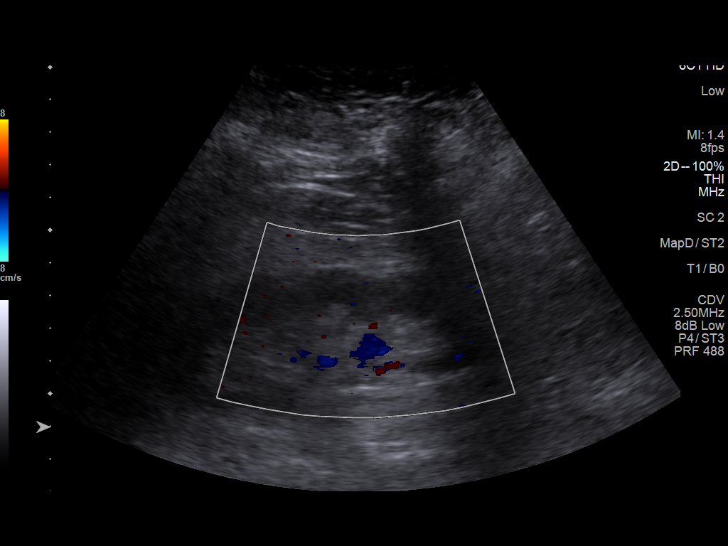
[im 10/30]
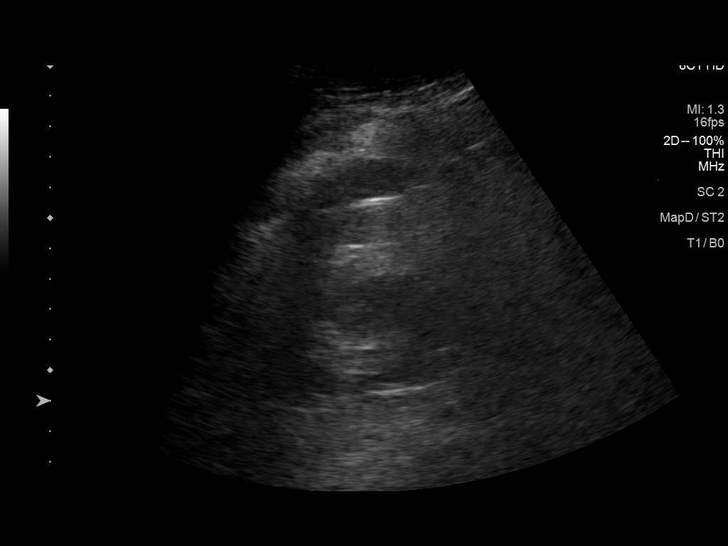
[im 11/30]
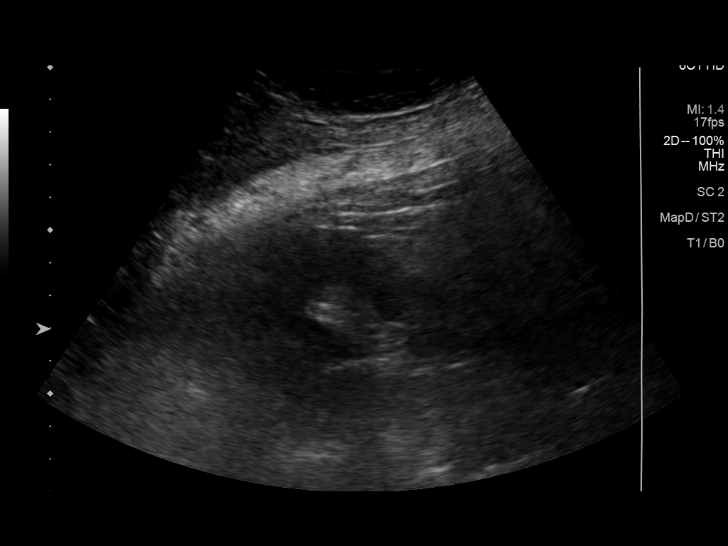
[im 14/30]
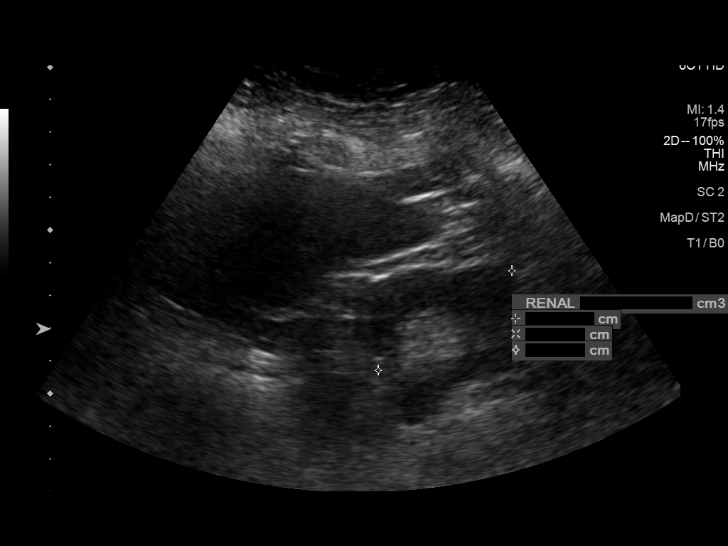
[im 16/30]
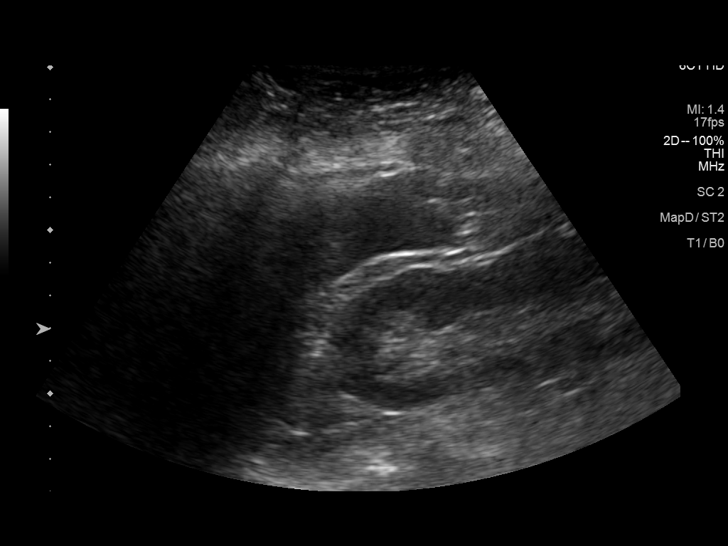
[im 19/30]
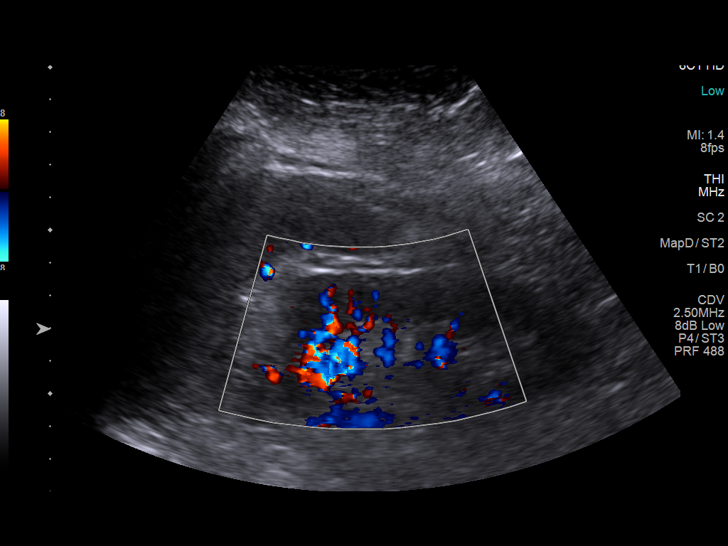
[im 20/30]
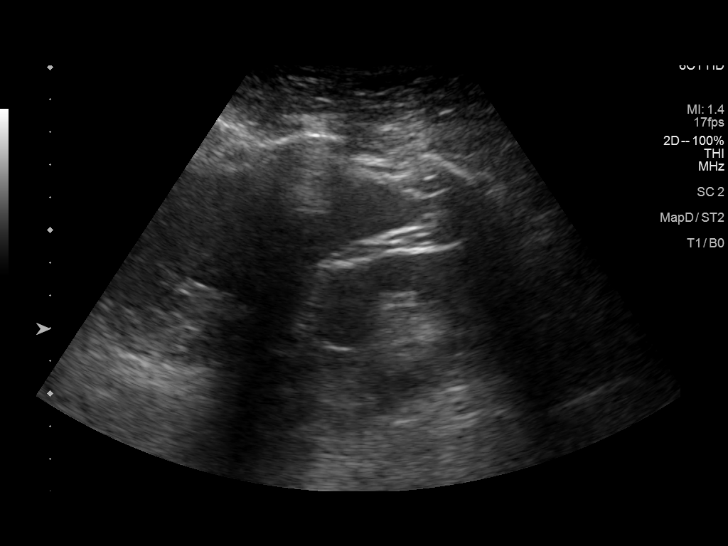
[im 22/30]
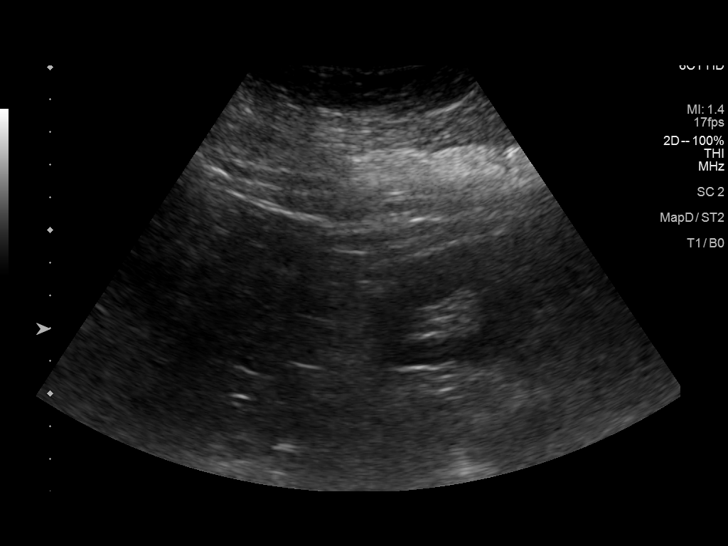
[im 25/30]
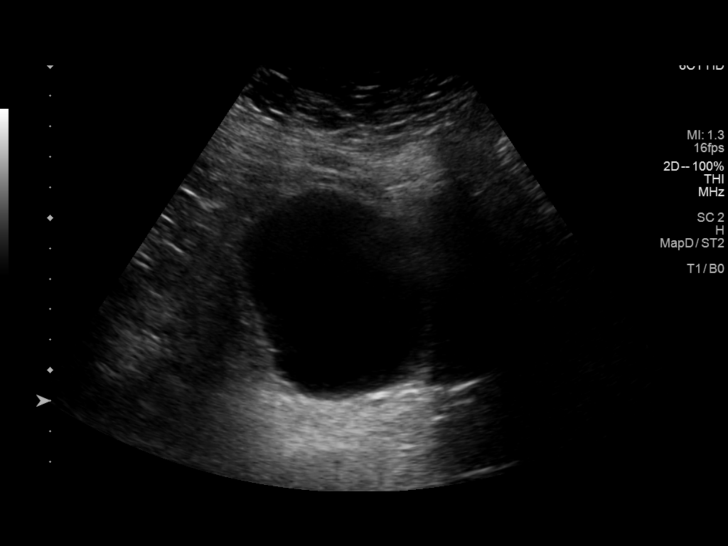
[im 27/30]
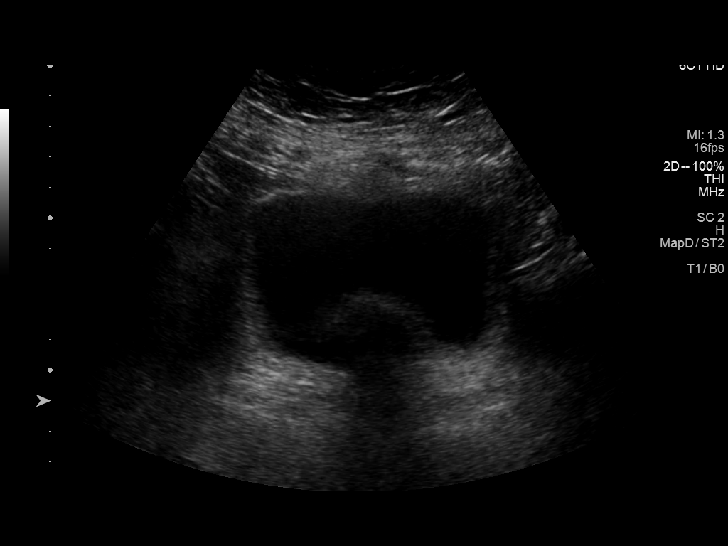
[im 30/30]
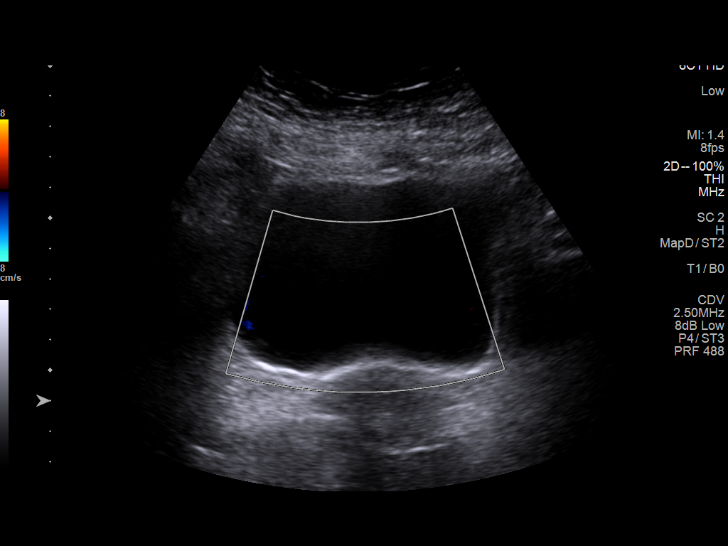

[14 of 25 positions shown; findings below may reference images not displayed]

FINDINGS: Right Kidney:

Renal measurements: 11.9 x 3.8 x 5.3 cm = volume: 127 mL.
Echogenicity within normal limits. No mass or hydronephrosis
visualized.

Left Kidney:

Renal measurements: 11.3 x 4.3 x 5.1 cm = volume: 129 mL.
Echogenicity within normal limits. No mass or hydronephrosis
visualized.

Bladder:

Appears normal for degree of bladder distention.

Other:

None.
IMPRESSION: Normal exam.

## 2021-06-05 ENCOUNTER — Other Ambulatory Visit: Payer: Self-pay | Admitting: Family

## 2021-06-05 DIAGNOSIS — M109 Gout, unspecified: Secondary | ICD-10-CM

## 2021-06-05 DIAGNOSIS — E785 Hyperlipidemia, unspecified: Secondary | ICD-10-CM

## 2021-06-05 DIAGNOSIS — E1169 Type 2 diabetes mellitus with other specified complication: Secondary | ICD-10-CM

## 2021-07-12 DIAGNOSIS — E785 Hyperlipidemia, unspecified: Secondary | ICD-10-CM

## 2021-07-12 DIAGNOSIS — M109 Gout, unspecified: Secondary | ICD-10-CM

## 2021-07-12 DIAGNOSIS — E1169 Type 2 diabetes mellitus with other specified complication: Secondary | ICD-10-CM

## 2021-07-12 MED ORDER — FENOFIBRATE 160 MG PO TABS: 160.0000 mg | ORAL_TABLET | Freq: Every day | ORAL | 0 refills | Status: DC

## 2021-07-12 MED ORDER — ALLOPURINOL 300 MG PO TABS: ORAL_TABLET | ORAL | 0 refills | Status: DC

## 2021-07-12 NOTE — Addendum Note (Signed)
Addended by: Ladean Raya on: 07/12/2021 04:30 PM   Modules accepted: Orders

## 2021-07-13 DIAGNOSIS — N1832 Chronic kidney disease, stage 3b: Secondary | ICD-10-CM | POA: Diagnosis not present

## 2021-07-16 DIAGNOSIS — E875 Hyperkalemia: Secondary | ICD-10-CM | POA: Diagnosis not present

## 2021-07-16 DIAGNOSIS — I129 Hypertensive chronic kidney disease with stage 1 through stage 4 chronic kidney disease, or unspecified chronic kidney disease: Secondary | ICD-10-CM | POA: Diagnosis not present

## 2021-07-16 DIAGNOSIS — N1831 Chronic kidney disease, stage 3a: Secondary | ICD-10-CM | POA: Diagnosis not present

## 2021-07-16 DIAGNOSIS — E1122 Type 2 diabetes mellitus with diabetic chronic kidney disease: Secondary | ICD-10-CM | POA: Diagnosis not present

## 2021-09-21 ENCOUNTER — Other Ambulatory Visit: Payer: Self-pay | Admitting: Family

## 2021-09-21 DIAGNOSIS — E1169 Type 2 diabetes mellitus with other specified complication: Secondary | ICD-10-CM

## 2021-09-21 DIAGNOSIS — M109 Gout, unspecified: Secondary | ICD-10-CM

## 2021-10-04 ENCOUNTER — Encounter: Payer: Self-pay | Admitting: Family

## 2021-10-04 ENCOUNTER — Ambulatory Visit: Payer: BC Managed Care – PPO | Admitting: Family

## 2021-10-04 ENCOUNTER — Other Ambulatory Visit: Payer: Self-pay

## 2021-10-04 VITALS — BP 138/77 | HR 79 | Temp 96.8°F | Ht 62.0 in | Wt 233.4 lb

## 2021-10-04 DIAGNOSIS — F411 Generalized anxiety disorder: Secondary | ICD-10-CM | POA: Diagnosis not present

## 2021-10-04 DIAGNOSIS — E1165 Type 2 diabetes mellitus with hyperglycemia: Secondary | ICD-10-CM | POA: Diagnosis not present

## 2021-10-04 DIAGNOSIS — N183 Chronic kidney disease, stage 3 unspecified: Secondary | ICD-10-CM

## 2021-10-04 DIAGNOSIS — Z0001 Encounter for general adult medical examination with abnormal findings: Secondary | ICD-10-CM | POA: Diagnosis not present

## 2021-10-04 DIAGNOSIS — M109 Gout, unspecified: Secondary | ICD-10-CM

## 2021-10-04 DIAGNOSIS — G4733 Obstructive sleep apnea (adult) (pediatric): Secondary | ICD-10-CM

## 2021-10-04 DIAGNOSIS — Z79899 Other long term (current) drug therapy: Secondary | ICD-10-CM

## 2021-10-04 DIAGNOSIS — E1122 Type 2 diabetes mellitus with diabetic chronic kidney disease: Secondary | ICD-10-CM

## 2021-10-04 DIAGNOSIS — I152 Hypertension secondary to endocrine disorders: Secondary | ICD-10-CM

## 2021-10-04 DIAGNOSIS — E079 Disorder of thyroid, unspecified: Secondary | ICD-10-CM

## 2021-10-04 DIAGNOSIS — E1159 Type 2 diabetes mellitus with other circulatory complications: Secondary | ICD-10-CM | POA: Diagnosis not present

## 2021-10-04 DIAGNOSIS — Z0000 Encounter for general adult medical examination without abnormal findings: Secondary | ICD-10-CM

## 2021-10-04 DIAGNOSIS — E559 Vitamin D deficiency, unspecified: Secondary | ICD-10-CM

## 2021-10-04 DIAGNOSIS — E1169 Type 2 diabetes mellitus with other specified complication: Secondary | ICD-10-CM

## 2021-10-04 DIAGNOSIS — E785 Hyperlipidemia, unspecified: Secondary | ICD-10-CM

## 2021-10-04 DIAGNOSIS — R079 Chest pain, unspecified: Secondary | ICD-10-CM | POA: Diagnosis not present

## 2021-10-04 DIAGNOSIS — E611 Iron deficiency: Secondary | ICD-10-CM

## 2021-10-04 DIAGNOSIS — N1832 Chronic kidney disease, stage 3b: Secondary | ICD-10-CM

## 2021-10-04 MED ORDER — METOPROLOL SUCCINATE ER 25 MG PO TB24: 25.0000 mg | ORAL_TABLET | Freq: Every day | ORAL | 1 refills | Status: DC

## 2021-10-04 MED ORDER — ALLOPURINOL 300 MG PO TABS: ORAL_TABLET | ORAL | 0 refills | Status: DC

## 2021-10-04 MED ORDER — LEVOTHYROXINE SODIUM 75 MCG PO TABS: ORAL_TABLET | ORAL | 1 refills | Status: DC

## 2021-10-04 MED ORDER — FENOFIBRATE 160 MG PO TABS: 160.0000 mg | ORAL_TABLET | Freq: Every day | ORAL | 0 refills | Status: DC

## 2021-10-04 MED ORDER — OMEPRAZOLE 40 MG PO CPDR: 40.0000 mg | DELAYED_RELEASE_CAPSULE | Freq: Every day | ORAL | 1 refills | Status: DC

## 2021-10-04 NOTE — Patient Instructions (Signed)
Chest Wall Pain Chest wall pain is pain in or around the bones and muscles of your chest. Sometimes, an injury causes this pain. Excessive coughing or overuse of arm and chest muscles may also cause chest wall pain. Sometimes, the cause may not be known. This pain may take several weeks or longer to get better. Follow these instructions at home: Managing pain, stiffness, and swelling  If directed, put ice on the painful area: Put ice in a plastic bag. Place a towel between your skin and the bag. Leave the ice on for 20 minutes, 2-3 times per day. Activity Rest as told by your health care provider. Avoid activities that cause pain. These include any activities that use your chest muscles or your abdominal and side muscles to lift heavy items. Ask your health care provider what activities are safe for you. General instructions  Take over-the-counter and prescription medicines only as told by your health care provider. Do not use any products that contain nicotine or tobacco, such as cigarettes, e-cigarettes, and chewing tobacco. These can delay healing after injury. If you need help quitting, ask your health care provider. Keep all follow-up visits as told by your health care provider. This is important. Contact a health care provider if: You have a fever. Your chest pain becomes worse. You have new symptoms. Get help right away if: You have nausea or vomiting. You feel sweaty or light-headed. You have a cough with mucus from your lungs (sputum) or you cough up blood. You develop shortness of breath. These symptoms may represent a serious problem that is an emergency. Do not wait to see if the symptoms will go away. Get medical help right away. Call your local emergency services (911 in the [place] Do not drive yourself to the hospital. Summary Chest wall pain is pain in or around the bones and muscles of your chest. Depending on the cause, it may be treated with ice, rest, medicines, and  avoiding activities that cause pain. Contact a health care provider if you have a fever, worsening chest pain, or new symptoms. Get help right away if you feel light-headed or you develop shortness of breath. These symptoms may be an emergency. This information is not intended to replace advice given to you by your health care provider. Make sure you discuss any questions you have with your health care provider. Document Revised: 01/19/2021 Document Reviewed: 01/19/2021 Elsevier Patient Education  [place].

## 2021-10-04 NOTE — Progress Notes (Signed)
Subjective:    Patient ID: Holly Rodriguez, female    DOB: 03/24/1966, 55 y.o.   MRN: [id number]  Chief Complaint  Patient presents with   Medical Management of Chronic Issues   Chest Pain   Pt presents to the office today for CPE and chronic follow up.  She is followed by Endocrinologists for DM. She has CKD and limits NSAID's. Has seen Nephrologists, but was released. She is complaining of chest pain that started a couple of months on and off. She has had increased anxiety with her grandmother being placed in hospice and her son and grandchild living with her.   She is morbid obese with BMI of 42. She has OSA and uses CPAP nightly. This is stable.  Diabetes She presents for her follow-up diabetic visit. She has type 2 diabetes mellitus. Associated symptoms include chest pain and foot paresthesias. Pertinent negatives for diabetes include no blurred vision and no fatigue. Symptoms are stable. Diabetic complications include peripheral neuropathy. Pertinent negatives for diabetic complications include no CVA or heart disease. Risk factors for coronary artery disease include dyslipidemia, diabetes mellitus, hypertension, sedentary lifestyle and post-menopausal. She is following a generally unhealthy diet. (Does not check blood glucose at home)  Thyroid Problem Presents for follow-up visit. Symptoms include constipation and dry skin. Patient reports no fatigue or hoarse voice. The symptoms have been stable. Her past medical history is significant for hyperlipidemia.  Hyperlipidemia This is a chronic problem. The current episode started more than 1 year ago. The problem is controlled. Recent lipid tests were reviewed and are normal. Exacerbating diseases include obesity. Associated symptoms include chest pain. Pertinent negatives include no shortness of breath. Current antihyperlipidemic treatment includes ezetimibe. The current treatment provides mild improvement of lipids. Risk factors for  coronary artery disease include dyslipidemia, hypertension and a sedentary lifestyle.  Gastroesophageal Reflux She complains of belching, chest pain and heartburn. She reports no hoarse voice. This is a chronic problem. The current episode started more than 1 year ago. The problem occurs occasionally. The symptoms are aggravated by certain foods. Pertinent negatives include no fatigue. Risk factors include obesity. She has tried a PPI for the symptoms. The treatment provided moderate relief.  Anemia Presents for follow-up visit. Symptoms include malaise/fatigue.  Chest Pain  This is a new problem. The current episode started more than 1 month ago. The onset quality is gradual. The problem occurs intermittently. The problem has been waxing and waning. The pain is present in the lateral region. The pain is mild. Quality: aching. The pain radiates to the left shoulder. Associated symptoms include malaise/fatigue. Pertinent negatives include no exertional chest pressure, irregular heartbeat, lower extremity edema, shortness of breath or sputum production. The pain is aggravated by food. She has tried rest for the symptoms. The treatment provided mild relief. Risk factors include post-menopausal and sedentary lifestyle.  Her past medical history is significant for hyperlipidemia and thyroid problem.  Pertinent negatives for past medical history include no heart disease.     Review of Systems  Constitutional:  Positive for malaise/fatigue. Negative for fatigue.  HENT:  Negative for hoarse voice.   Eyes:  Negative for blurred vision.  Respiratory:  Negative for sputum production and shortness of breath.   Cardiovascular:  Positive for chest pain.  Gastrointestinal:  Positive for constipation and heartburn.  All other systems reviewed and are negative.  Family History  Problem Relation Age of Onset   Arthritis Mother    Fibromyalgia Mother    Hyperlipidemia  Mother    Arthritis Maternal Grandmother     Heart disease Maternal Grandmother    Alzheimer's disease Maternal Grandfather    Gout Daughter    Gout Son    Social History   Socioeconomic History   Marital status: Married    Spouse name: Not on file   Number of children: Not on file   Years of education: Not on file   Highest education level: Not on file  Occupational History   Not on file  Tobacco Use   Smoking status: Former    Years: 2.00    Types: Cigarettes    Quit date: 11/18/2008    Years since quitting: 12.8   Smokeless tobacco: Never   Tobacco comments:    quit smoking about 1o0 years ago but passive smoking exposure  Vaping Use   Vaping Use: Never used  Substance and Sexual Activity   Alcohol use: Yes    Comment: occasional   Drug use: No   Sexual activity: Not on file  Other Topics Concern   Not on file  Social History Narrative   Not on file   Social Determinants of Health   Financial Resource Strain: Not on file  Food Insecurity: Not on file  Transportation Needs: Not on file  Physical Activity: Not on file  Stress: Not on file  Social Connections: Not on file      Objective:   Physical Exam Vitals reviewed.  Constitutional:      General: She is not in acute distress.    Appearance: She is well-developed. She is obese.  HENT:     Head: Normocephalic and atraumatic.     Right Ear: External ear normal.  Eyes:     Pupils: Pupils are equal, round, and reactive to light.  Neck:     Thyroid: No thyromegaly.  Cardiovascular:     Rate and Rhythm: Normal rate and regular rhythm.     Heart sounds: Normal heart sounds. No murmur heard. Pulmonary:     Effort: Pulmonary effort is normal. No respiratory distress.     Breath sounds: Normal breath sounds. No wheezing.  Abdominal:     General: Bowel sounds are normal. There is no distension.     Palpations: Abdomen is soft.     Tenderness: There is no abdominal tenderness.  Musculoskeletal:        General: No tenderness. Normal range of motion.      Cervical back: Normal range of motion and neck supple.  Skin:    General: Skin is warm and dry.  Neurological:     Mental Status: She is alert and oriented to person, place, and time.     Cranial Nerves: No cranial nerve deficit.     Deep Tendon Reflexes: Reflexes are normal and symmetric.  Psychiatric:        Behavior: Behavior normal.        Thought Content: Thought content normal.        Judgment: Judgment normal.   Diabetic Foot Exam - Simple   Simple Foot Form Diabetic Foot exam was performed with the following findings: Yes 10/04/2021 11:21 AM  Visual Inspection No deformities, no ulcerations, no other skin breakdown bilaterally: Yes Sensation Testing Intact to touch and monofilament testing bilaterally: Yes Pulse Check Posterior Tibialis and Dorsalis pulse intact bilaterally: Yes Comments       BP 138/77   Pulse 79   Temp (!) 96.8 F (36 C) (Temporal)   Ht 5' 2"  (1.575 m)  Wt 233 lb 6.4 oz (105.9 kg)   LMP 06/29/2017   BMI 42.69 kg/m      Assessment & Plan:  Holly Rodriguez comes in today with chief complaint of Medical Management of Chronic Issues and Chest Pain   Diagnosis and orders addressed:  1. Controlled substance agreement signed - CMP14+EGFR  2. Anxiety state - CMP14+EGFR  3. Hyperlipidemia associated with type 2 diabetes mellitus (HCC) - fenofibrate 160 MG tablet; Take 1 tablet (160 mg total) by mouth daily.  Dispense: 90 tablet; Refill: 0 - CMP14+EGFR - Lipid panel  4. Thyroid disease - levothyroxine (SYNTHROID) 75 MCG tablet; TAKE 1 TABLET BY MOUTH  DAILY BEFORE BREAKFAST  Dispense: 90 tablet; Refill: 1 - CMP14+EGFR - TSH  5. Acute gout, unspecified cause, unspecified site  - allopurinol (ZYLOPRIM) 300 MG tablet; TAKE 1 TABLET BY MOUTH  DAILY  Dispense: 90 tablet; Refill: 0 - CMP14+EGFR  6. Chest pain, unspecified type - EKG 12-Lead - CMP14+EGFR - Ambulatory referral to Cardiology  7. Hypertension associated with  diabetes ([place]) - CMP14+EGFR - Ambulatory referral to Cardiology  8. Type 2 diabetes mellitus with hyperglycemia, without long-term current use of insulin (HCC) - CMP14+EGFR - Lipid panel - Ambulatory referral to Cardiology  9. CKD stage 3 due to type 2 diabetes mellitus (HCC) - CMP14+EGFR  10. OSA (obstructive sleep apnea) - CMP14+EGFR - Ambulatory referral to Cardiology  11. Stage 3b chronic kidney disease ([place])  - CMP14+EGFR  12. GAD (generalized anxiety disorder) - CMP14+EGFR  13. Iron deficiency - CMP14+EGFR - Anemia Profile B  14. Morbid obesity ([place]) - CMP14+EGFR - Ambulatory referral to Cardiology  15. Vitamin D deficiency  - CMP14+EGFR - VITAMIN D 25 Hydroxy (Vit-D Deficiency, Fractures)  16. Annual physical exam  - CMP14+EGFR - Anemia Profile B - Lipid panel - TSH - VITAMIN D 25 Hydroxy (Vit-D Deficiency, Fractures)   Labs pending Health Maintenance reviewed Diet and exercise encouraged  Follow up plan: 3 months    Evelina Dun, FNP

## 2021-10-05 LAB — ANEMIA PROFILE B
Basophils Absolute: 0.1 10*3/uL (ref 0.0–0.2)
Basos: 1 %
EOS (ABSOLUTE): 0.5 10*3/uL — ABNORMAL HIGH (ref 0.0–0.4)
Eos: 5 %
Ferritin: 31 ng/mL (ref 15–150)
Folate: 5.1 ng/mL (ref >3.0)
Hematocrit: 43.1 % (ref 34.0–46.6)
Hemoglobin: 14.3 g/dL (ref 11.1–15.9)
Immature Grans (Abs): 0 10*3/uL (ref 0.0–0.1)
Immature Granulocytes: 0 %
Iron Saturation: 20 % (ref 15–55)
Iron: 96 ug/dL (ref 27–159)
Lymphocytes Absolute: 3.5 10*3/uL — ABNORMAL HIGH (ref 0.7–3.1)
Lymphs: 34 %
MCH: 28.9 pg (ref 26.6–33.0)
MCHC: 33.2 g/dL (ref 31.5–35.7)
MCV: 87 fL (ref 79–97)
Monocytes Absolute: 0.6 10*3/uL (ref 0.1–0.9)
Monocytes: 6 %
Neutrophils Absolute: 5.6 10*3/uL (ref 1.4–7.0)
Neutrophils: 54 %
Platelets: 309 10*3/uL (ref 150–450)
RBC: 4.95 x10E6/uL (ref 3.77–5.28)
RDW: 14.8 % (ref 11.7–15.4)
Retic Ct Pct: 1.8 % (ref 0.6–2.6)
Total Iron Binding Capacity: 489 ug/dL — ABNORMAL HIGH (ref 250–450)
UIBC: 393 ug/dL (ref 131–425)
Vitamin B-12: 524 pg/mL (ref 232–1245)
WBC: 10.4 10*3/uL (ref 3.4–10.8)

## 2021-10-05 LAB — CMP14+EGFR
ALT: 25 IU/L (ref 0–32)
AST: 32 IU/L (ref 0–40)
Albumin/Globulin Ratio: 1.7 (ref 1.2–2.2)
Albumin: 4.7 g/dL (ref 3.8–4.9)
Alkaline Phosphatase: 95 IU/L (ref 44–121)
BUN/Creatinine Ratio: 16 (ref 9–23)
BUN: 18 mg/dL (ref 6–24)
Bilirubin Total: 0.3 mg/dL (ref 0.0–1.2)
CO2: 23 mmol/L (ref 20–29)
Calcium: 9.4 mg/dL (ref 8.7–10.2)
Chloride: 100 mmol/L (ref 96–106)
Creatinine, Ser: 1.12 mg/dL — ABNORMAL HIGH (ref 0.57–1.00)
Globulin, Total: 2.8 g/dL (ref 1.5–4.5)
Glucose: 97 mg/dL (ref 70–99)
Potassium: 4.4 mmol/L (ref 3.5–5.2)
Sodium: 138 mmol/L (ref 134–144)
Total Protein: 7.5 g/dL (ref 6.0–8.5)
eGFR: 58 mL/min/{1.73_m2} — ABNORMAL LOW (ref >59)

## 2021-10-05 LAB — TSH: TSH: 2.33 u[IU]/mL (ref 0.450–4.500)

## 2021-10-05 LAB — VITAMIN D 25 HYDROXY (VIT D DEFICIENCY, FRACTURES): Vit D, 25-Hydroxy: 35.5 ng/mL (ref 30.0–100.0)

## 2021-10-05 LAB — LIPID PANEL
Chol/HDL Ratio: 4.4 ratio (ref 0.0–4.4)
Cholesterol, Total: 189 mg/dL (ref 100–199)
HDL: 43 mg/dL (ref >39)
LDL Chol Calc (NIH): 106 mg/dL — ABNORMAL HIGH (ref 0–99)
Triglycerides: 232 mg/dL — ABNORMAL HIGH (ref 0–149)
VLDL Cholesterol Cal: 40 mg/dL (ref 5–40)

## 2021-10-22 ENCOUNTER — Encounter: Payer: Self-pay | Admitting: Cardiology

## 2021-10-22 DIAGNOSIS — E785 Hyperlipidemia, unspecified: Secondary | ICD-10-CM | POA: Insufficient documentation

## 2021-10-22 DIAGNOSIS — R072 Precordial pain: Secondary | ICD-10-CM | POA: Insufficient documentation

## 2021-10-22 NOTE — Progress Notes (Deleted)
Cardiology Office Note   Date:  10/23/2021   ID:  Holly Rodriguez, Holly Rodriguez 10/19/1966, MRN [id number]  PCP:  Sharion Balloon, FNP  Cardiologist:   None Referring:  ***  No chief complaint on file.     History of Present Illness: Holly Rodriguez is a 55 y.o. female who  is referred by *** for evaluation of chest pain . ***     Past Medical History:  Diagnosis Date   Anxiety    Bulging of intervertebral disc between L4 and L5    CKD (chronic kidney disease), stage III ([place])    Due to Diabetes   Diabetes mellitus without complication ([place])    Family history of adverse reaction to anesthesia    mother post op nausea and vomiting   Fatty liver    GERD (gastroesophageal reflux disease)    Gout    Herpes    Cold sores   History of appendicitis 2014   History of colon polyps    History of migraine    Hyperlipidemia    Hypertension    Hypothyroidism    IBS (irritable bowel syndrome)    Iron deficiency anemia    Numbness and tingling of both lower extremities    Right is greather than left   Obesity    Peripheral neuropathy    Postmenopausal bleeding    Preseptal cellulitis of left eye 11/06/2019   Seasonal allergies    Skin cancer    left arm   Sleep apnea    Uterine polyp     Past Surgical History:  Procedure Laterality Date   CESAREAN SECTION     x2   CHOLECYSTECTOMY     COLONOSCOPY  11/23/2018   DILATATION & CURETTAGE/HYSTEROSCOPY WITH MYOSURE N/A 11/25/2019   Procedure: DILATATION & CURETTAGE/HYSTEROSCOPY WITH MYOSURE;  Surgeon: Brien Few, MD;  Location: [facility];  Service: Gynecology;  Laterality: N/A;   ESOPHAGEAL DILATION     Stomach dilation after gastric surgery   GASTRIC RESTRICTION SURGERY     x3   LAPAROSCOPIC APPENDECTOMY N/A 10/25/2013   Procedure: APPENDECTOMY LAPAROSCOPIC;  Surgeon: Jamesetta So, MD;  Location: [facility];  Service: General;  Laterality: N/A;   SKIN CANCER EXCISION Left    Arm   TONSILLECTOMY      TUBAL LIGATION     UPPER GI ENDOSCOPY       Current Outpatient Medications  Medication Sig Dispense Refill   allopurinol (ZYLOPRIM) 300 MG tablet TAKE 1 TABLET BY MOUTH  DAILY 90 tablet 0   Empagliflozin-metFORMIN HCl (SYNJARDY) 12.03-999 MG TABS Take 1 tablet by mouth 2 (two) times daily. Most of the time its once daily 180 tablet 1   fenofibrate 160 MG tablet Take 1 tablet (160 mg total) by mouth daily. 90 tablet 0   glucose blood test strip Reli-on glucometer.  Test Blood sugar qid Dx. 250.01     Lancets (ONETOUCH ULTRASOFT) lancets Test 1X per day and prn   Dx 250.01 100 each 12   levothyroxine (SYNTHROID) 75 MCG tablet TAKE 1 TABLET BY MOUTH  DAILY BEFORE BREAKFAST 90 tablet 1   metoprolol succinate (TOPROL-XL) 25 MG 24 hr tablet Take 1 tablet (25 mg total) by mouth daily. 90 tablet 1   Omega-3 Fatty Acids (OMEGA 3 PO) Take 4 capsules by mouth daily.     omeprazole (PRILOSEC) 40 MG capsule Take 1 capsule (40 mg total) by mouth daily. 90 capsule 1   Semaglutide, 1  MG/DOSE, (OZEMPIC, 1 MG/DOSE,) 4 MG/3ML SOPN Inject 1 mg into the skin once a week.     No current facility-administered medications for this visit.    Allergies:   Statins    Social History:  The patient  reports that she quit smoking about 12 years ago. Her smoking use included cigarettes. She has never used smokeless tobacco. She reports current alcohol use. She reports that she does not use drugs.   Family History:  The patient's ***family history includes Alzheimer's disease in her maternal grandfather; Arthritis in her maternal grandmother and mother; Fibromyalgia in her mother; Gout in her daughter and son; Heart disease in her maternal grandmother; Hyperlipidemia in her mother.    ROS:  Please see the history of present illness.   Otherwise, review of systems are positive for {NONE DEFAULTED:18576}.   All other systems are reviewed and negative.    PHYSICAL EXAM: VS:  BP 132/84   Pulse 86   Ht 5\' 3"  (1.6 m)    Wt 233 lb 12.8 oz (106.1 kg)   LMP 06/29/2017   SpO2 99%   BMI 41.42 kg/m  , BMI Body mass index is 41.42 kg/m. GENERAL:  Well appearing HEENT:  Pupils equal round and reactive, fundi not visualized, oral mucosa unremarkable NECK:  No jugular venous distention, waveform within normal limits, carotid upstroke brisk and symmetric, no bruits, no thyromegaly LYMPHATICS:  No cervical, inguinal adenopathy LUNGS:  Clear to auscultation bilaterally BACK:  No CVA tenderness CHEST:  Unremarkable HEART:  PMI not displaced or sustained,S1 and S2 within normal limits, no S3, no S4, no clicks, no rubs, *** murmurs ABD:  Flat, positive bowel sounds normal in frequency in pitch, no bruits, no rebound, no guarding, no midline pulsatile mass, no hepatomegaly, no splenomegaly EXT:  2 plus pulses throughout, no edema, no cyanosis no clubbing SKIN:  No rashes no nodules NEURO:  Cranial nerves II through XII grossly intact, motor grossly intact throughout PSYCH:  Cognitively intact, oriented to person place and time    EKG:  EKG {ACTION; IS/IS CVE:[id number]} ordered today. The ekg ordered today demonstrates ***   Recent Labs: 10/04/2021: ALT 25; BUN 18; Creatinine, Ser 1.12; Hemoglobin 14.3; Platelets 309; Potassium 4.4; Sodium 138; TSH 2.330    Lipid Panel    Component Value Date/Time   CHOL 189 10/04/2021 1131   CHOL 183 02/01/2013 1244   TRIG 232 (H) 10/04/2021 1131   TRIG 401 (H) 11/30/2016 0907   TRIG 217 (H) 02/01/2013 1244   HDL 43 10/04/2021 1131   HDL 39 (L) 11/30/2016 0907   HDL 43 02/01/2013 1244   CHOLHDL 4.4 10/04/2021 1131   LDLCALC 106 (H) 10/04/2021 1131   LDLCALC 107 (H) 05/02/2014 1640   LDLCALC 97 02/01/2013 1244      Wt Readings from Last 3 Encounters:  10/23/21 233 lb 12.8 oz (106.1 kg)  10/04/21 233 lb 6.4 oz (105.9 kg)  04/05/21 241 lb 6.4 oz (109.5 kg)      Other studies Reviewed: Additional studies/ records that were reviewed today include: ***. Review of  the above records demonstrates:  Please see elsewhere in the note.  ***   ASSESSMENT AND PLAN:  Precordial chest pain:  *** ***  DM:  ***  HTN:  ***  Dyslipidemia:  ***    Current medicines are reviewed at length with the patient today.  The patient {ACTIONS; HAS/DOES NOT HAVE:19233} concerns regarding medicines.  The following changes have been made:  {PLAN; NO  CHANGE:13088:s}  Labs/ tests ordered today include: *** No orders of the defined types were placed in this encounter.    Disposition:   FU with ***    Signed, Minus Breeding, MD  10/23/2021 2:19 PM    [facility]

## 2021-10-23 ENCOUNTER — Other Ambulatory Visit: Payer: Self-pay

## 2021-10-23 ENCOUNTER — Encounter: Payer: Self-pay | Admitting: Cardiology

## 2021-10-23 ENCOUNTER — Ambulatory Visit: Payer: BC Managed Care – PPO | Admitting: Cardiology

## 2021-10-23 VITALS — BP 132/84 | HR 86 | Ht 63.0 in | Wt 233.8 lb

## 2021-10-23 DIAGNOSIS — I10 Essential (primary) hypertension: Secondary | ICD-10-CM | POA: Diagnosis not present

## 2021-10-23 DIAGNOSIS — R072 Precordial pain: Secondary | ICD-10-CM | POA: Diagnosis not present

## 2021-10-23 DIAGNOSIS — E785 Hyperlipidemia, unspecified: Secondary | ICD-10-CM

## 2021-10-23 DIAGNOSIS — E118 Type 2 diabetes mellitus with unspecified complications: Secondary | ICD-10-CM | POA: Diagnosis not present

## 2021-10-23 NOTE — Progress Notes (Signed)
Cardiology Office Note  Date:  10/23/2021   ID:  Holly Rodriguez, Holly Rodriguez [birth year], MRN [id number]  PCP:  Sharion Balloon, FNP  Cardiologist:   None Referring:  Evelina Dun, FNP  Chief Complaint  Patient presents with   Chest Pain     History of Present Illness: Holly Rodriguez is a 55 y.o. female who  is referred by Evelina Dun, NP, for evaluation of chest pain .   At PCP visit 11/17, provider notes 2 months history of intermittent chest pain. Described to PCP as aching lateral chest pain that radiates to left shoulder. Associated with fatigue. Notably, pain aggravated by food. Patient has been experiencing increased anxiety with her grandmother being placed in hospice and her son and grandchild living with her.   Labs collected by PCP 11/17 demonstrate creatinine consistent with known CKD, LDL 106, triglycerides 232 (confirmed with patient that lab was taken while fasting). TSH within normal limits. CBC without evidence of anemia. Iron levels normal.   Pertinent current meds include fenofibrate 160 mg, metoprolol succ 25 mg, omeprazole, empagliflozin-metformin 12.03-999 mg, Ozempic, omega-3 fatty acids.   EKG 11/17 demonstrates sinus rhythm 84 bpm with first degree heart block (PR 240 ms), no ST segment abnormalities. No ECHO on file in Epic.   EKG today in cardiology clinic demonstrates first degree heart block, PR 234 bpm  HPI - son and grandson have moved in six months ago and "life has been chaotic" ever since - 3/10 pain - intermittent onset, no predictable pattern, possibly after she eats - forgets medications sometimes, takes them late, misses a whole day sometimes - duration: longer than 2 months, just more noticeable in the last 2 months - relieving factors: none noted by patient - aggravating factors: none noted by patient - radiates to left shoulder and back - denies concurrent diaphoresis, pre-syncope, SOB - denies worsening with exertion or relief with  rest - associated symptoms: headache (unsure if from heater at work), but happens independent of chest pain - no personal cardiac history  FMHx: maternal grandma with congestive heart failure, no heart history in children or grandchildren  PMHx:  -- T2DM takes synjardy once a day instead of prescribed twice a day (forgets often) -- HTN: takes metoprolol daily, no issues -- OSA: uses CPAP nightly, "couldn't sleep without it" -- HLD: fenofibrate daily, no issues with forgetting (unless forgets whole day, issues with statins making her legs hurt)  Past Medical History:  Diagnosis Date   Anxiety    Bulging of intervertebral disc between L4 and L5    CKD (chronic kidney disease), stage III ([place])    Due to Diabetes   Diabetes mellitus without complication ([place])    Family history of adverse reaction to anesthesia    mother post op nausea and vomiting   Fatty liver    GERD (gastroesophageal reflux disease)    Gout    Herpes    Cold sores   History of appendicitis 2014   History of colon polyps    History of migraine    Hyperlipidemia    Hypertension    Hypothyroidism    IBS (irritable bowel syndrome)    Iron deficiency anemia    Numbness and tingling of both lower extremities    Right is greather than left   Obesity    Peripheral neuropathy    Postmenopausal bleeding    Preseptal cellulitis of left eye 11/06/2019   Seasonal allergies    Skin cancer  left arm   Sleep apnea    Uterine polyp     Past Surgical History:  Procedure Laterality Date   CESAREAN SECTION     x2   CHOLECYSTECTOMY     COLONOSCOPY  11/23/2018   DILATATION & CURETTAGE/HYSTEROSCOPY WITH MYOSURE N/A 11/25/2019   Procedure: DILATATION & CURETTAGE/HYSTEROSCOPY WITH MYOSURE;  Surgeon: Brien Few, MD;  Location: [place];  Service: Gynecology;  Laterality: N/A;   ESOPHAGEAL DILATION     Stomach dilation after gastric surgery   GASTRIC RESTRICTION SURGERY     x3   LAPAROSCOPIC  APPENDECTOMY N/A 10/25/2013   Procedure: APPENDECTOMY LAPAROSCOPIC;  Surgeon: Jamesetta So, MD;  Location: [facility];  Service: General;  Laterality: N/A;   SKIN CANCER EXCISION Left    Arm   TONSILLECTOMY     TUBAL LIGATION     UPPER GI ENDOSCOPY       Current Outpatient Medications  Medication Sig Dispense Refill   allopurinol (ZYLOPRIM) 300 MG tablet TAKE 1 TABLET BY MOUTH  DAILY 90 tablet 0   Empagliflozin-metFORMIN HCl (SYNJARDY) 12.03-999 MG TABS Take 1 tablet by mouth 2 (two) times daily. Most of the time its once daily 180 tablet 1   fenofibrate 160 MG tablet Take 1 tablet (160 mg total) by mouth daily. 90 tablet 0   glucose blood test strip Reli-on glucometer.  Test Blood sugar qid Dx. 250.01     Lancets (ONETOUCH ULTRASOFT) lancets Test 1X per day and prn   Dx 250.01 100 each 12   levothyroxine (SYNTHROID) 75 MCG tablet TAKE 1 TABLET BY MOUTH  DAILY BEFORE BREAKFAST 90 tablet 1   metoprolol succinate (TOPROL-XL) 25 MG 24 hr tablet Take 1 tablet (25 mg total) by mouth daily. 90 tablet 1   Omega-3 Fatty Acids (OMEGA 3 PO) Take 4 capsules by mouth daily.     omeprazole (PRILOSEC) 40 MG capsule Take 1 capsule (40 mg total) by mouth daily. 90 capsule 1   Semaglutide, 1 MG/DOSE, (OZEMPIC, 1 MG/DOSE,) 4 MG/3ML SOPN Inject 1 mg into the skin once a week.     No current facility-administered medications for this visit.    Allergies:   Statins    Social History:  The patient  reports that she quit smoking about 12 years ago. Her smoking use included cigarettes. She has never used smokeless tobacco. She reports current alcohol use. She reports that she does not use drugs.   Family History:  The patient's family history includes Alzheimer's disease in her maternal grandfather; Arthritis in her maternal grandmother and mother; Fibromyalgia in her mother; Gout in her daughter and son; Heart disease in her maternal grandmother; Hyperlipidemia in her mother.    ROS:  Please see the history  of present illness.   Otherwise, review of systems are positive for none.   All other systems are reviewed and negative.    PHYSICAL EXAM: VS:  BP 132/84   Pulse 86   Ht 5\' 3"  (1.6 m)   Wt 233 lb 12.8 oz (106.1 kg)   LMP 06/29/2017   SpO2 99%   BMI 41.42 kg/m  , BMI Body mass index is 41.42 kg/m. GENERAL:  Well appearing HEENT:  Pupils equal round and reactive, fundi not visualized, oral mucosa unremarkable NECK:  No jugular venous distention, waveform within normal limits, carotid upstroke brisk and symmetric, no bruits, no thyromegaly LYMPHATICS:  No cervical, inguinal adenopathy LUNGS:  Clear to auscultation bilaterally BACK:  No CVA tenderness CHEST:  Unremarkable HEART:  PMI not displaced or sustained,S1 and S2 within normal limits, no S3, no S4, no clicks, no rubs, no murmurs ABD:  Flat, positive bowel sounds normal in frequency in pitch, no bruits, no rebound, no guarding, no midline pulsatile mass, no hepatomegaly, no splenomegaly EXT:  2 plus pulses throughout, no edema, no cyanosis no clubbing SKIN:  No rashes no nodules NEURO:  Cranial nerves II through XII grossly intact, motor grossly intact throughout PSYCH:  Cognitively intact, oriented to person place and time    EKG:  EKG is ordered today. The ekg ordered today demonstrates first degree heart block, PR 234 bpm   Recent Labs: 10/04/2021: ALT 25; BUN 18; Creatinine, Ser 1.12; Hemoglobin 14.3; Platelets 309; Potassium 4.4; Sodium 138; TSH 2.330    Lipid Panel    Component Value Date/Time   CHOL 189 10/04/2021 1131   CHOL 183 02/01/2013 1244   TRIG 232 (H) 10/04/2021 1131   TRIG 401 (H) 11/30/2016 0907   TRIG 217 (H) 02/01/2013 1244   HDL 43 10/04/2021 1131   HDL 39 (L) 11/30/2016 0907   HDL 43 02/01/2013 1244   CHOLHDL 4.4 10/04/2021 1131   LDLCALC 106 (H) 10/04/2021 1131   LDLCALC 107 (H) 05/02/2014 1640   LDLCALC 97 02/01/2013 1244      Wt Readings from Last 3 Encounters:  10/23/21 233 lb 12.8  oz (106.1 kg)  10/04/21 233 lb 6.4 oz (105.9 kg)  04/05/21 241 lb 6.4 oz (109.5 kg)      Other studies Reviewed: Additional studies/ records that were reviewed today include: See above.  Review of the above records demonstrates:  Please see elsewhere in the note.    ASSESSMENT AND PLAN:  Precordial chest pain:  Given risk factors, would recommend exercise stress test and follow up with cardiology in 2 months. ASCVD 6.6%, no aspirin required. Risk factors include HTN, T2DM, HLD, OSA.  No prior calcium score.   DM:  Last A1c May 2022. Recommend recheck with PCP or endocrinologist. Continue with glucose control and lifestyle modifications.   HTN:  BP not at goal today, 132/84. Goal <120/<80. Only related med is metoprolol. Recommend PCP consider additional medication such as amlodipine 5 mg, taper as needed.   Dyslipidemia:  Primary prevention, LDL goal <100. Currently on fenofibrate and omega-3 fatty acids. Given patient is so close to goal, recommend dietary modifications.    Current medicines are reviewed at length with the patient today.  The patient does not have concerns regarding medicines.  The following changes have been made:  no change  Labs/ tests ordered today include: treadmill exercise stress test  Orders Placed This Encounter  Procedures   Cardiac Stress Test: Informed Consent Details: Physician/Practitioner Attestation; Transcribe to consent form and obtain patient signature   EXERCISE TOLERANCE TEST (ETT)   EKG 12-Lead      Disposition:   FU with cardiology in 2 months.    Signed, Ezequiel Essex, MD  PGY-2 [place] [facility] 10/23/2021 1:39 PM    [facility]  History and all data above reviewed.  Patient examined.  I agree with the findings as above.  The patient has chest discomfort as described above.  She does have significant cardiovascular risk factors.  Her pain is nonanginal greater than anginal.  He seems  to be at rest.  There is no associated symptoms.  She has not had prior cardiovascular work-up.  The patient exam reveals COR: Regular rate and  rhythm, no murmurs,  Lungs: Clear to auscultation bilaterally,  Abd: Positive bowel sounds normal in frequency and pitch, bruits, rebound, guarding, Ext 2+ pulses, no edema.  All available labs, radiology testing, previous records reviewed. Agree with documented assessment and plan.  Chest discomfort: Her chest discomfort has nonanginal greater than anginal features.  I think the pretest probability of obstructive coronary disease is low to moderate.  However, she does have longstanding diabetes as a risk factor.  I will bring the patient back for a POET (Plain Old Exercise Test). This will allow me to screen for obstructive coronary disease, risk stratify and very importantly provide a prescription for exercise. Jeneen Rinks Bradd Merlos  3:19 PM  10/23/2021

## 2021-10-23 NOTE — Progress Notes (Signed)
Cardiology Office Note  Date:  10/23/2021   ID:  Holly Rodriguez, Holly Rodriguez 02/10/1966, MRN [id number]  PCP:  Sharion Balloon, FNP  Cardiologist:   None Referring:  Evelina Dun, FNP  Chief Complaint  Patient presents with   Chest Pain     History of Present Illness: Holly Rodriguez is a 55 y.o. female who  is referred by Evelina Dun, NP, for evaluation of chest pain .   At PCP visit 11/17, provider notes 2 months history of intermittent chest pain. Described to PCP as aching lateral chest pain that radiates to left shoulder. Associated with fatigue. Notably, pain aggravated by food. Patient has been experiencing increased anxiety with her grandmother being placed in hospice and her son and grandchild living with her.   Labs collected by PCP 11/17 demonstrate creatinine consistent with known CKD, LDL 106, triglycerides 232 (confirmed with patient that lab was taken while fasting). TSH within normal limits. CBC without evidence of anemia. Iron levels normal.   Pertinent current meds include fenofibrate 160 mg, metoprolol succ 25 mg, omeprazole, empagliflozin-metformin 12.03-999 mg, Ozempic, omega-3 fatty acids.   EKG 11/17 demonstrates sinus rhythm 84 bpm with first degree heart block (PR 240 ms), no ST segment abnormalities. No ECHO on file in Epic.   EKG today in cardiology clinic demonstrates first degree heart block, PR 234 bpm  HPI - son and grandson have moved in six months ago and "life has been chaotic" ever since - 3/10 pain - intermittent onset, no predictable pattern, possibly after she eats - forgets medications sometimes, takes them late, misses a whole day sometimes - duration: longer than 2 months, just more noticeable in the last 2 months - relieving factors: none noted by patient - aggravating factors: none noted by patient - radiates to left shoulder and back - denies concurrent diaphoresis, pre-syncope, SOB - denies worsening with exertion or relief with  rest - associated symptoms: headache (unsure if from heater at work), but happens independent of chest pain - no personal cardiac history  FMHx: maternal grandma with congestive heart failure, no heart history in children or grandchildren  PMHx:  -- T2DM takes synjardy once a day instead of prescribed twice a day (forgets often) -- HTN: takes metoprolol daily, no issues -- OSA: uses CPAP nightly, "couldn't sleep without it" -- HLD: fenofibrate daily, no issues with forgetting (unless forgets whole day, issues with statins making her legs hurt)  Past Medical History:  Diagnosis Date   Anxiety    Bulging of intervertebral disc between L4 and L5    CKD (chronic kidney disease), stage III ([place])    Due to Diabetes   Diabetes mellitus without complication ([place])    Family history of adverse reaction to anesthesia    mother post op nausea and vomiting   Fatty liver    GERD (gastroesophageal reflux disease)    Gout    Herpes    Cold sores   History of appendicitis 2014   History of colon polyps    History of migraine    Hyperlipidemia    Hypertension    Hypothyroidism    IBS (irritable bowel syndrome)    Iron deficiency anemia    Numbness and tingling of both lower extremities    Right is greather than left   Obesity    Peripheral neuropathy    Postmenopausal bleeding    Preseptal cellulitis of left eye 11/06/2019   Seasonal allergies    Skin cancer  left arm   Sleep apnea    Uterine polyp     Past Surgical History:  Procedure Laterality Date   CESAREAN SECTION     x2   CHOLECYSTECTOMY     COLONOSCOPY  11/23/2018   DILATATION & CURETTAGE/HYSTEROSCOPY WITH MYOSURE N/A 11/25/2019   Procedure: DILATATION & CURETTAGE/HYSTEROSCOPY WITH MYOSURE;  Surgeon: Brien Few, MD;  Location: [place];  Service: Gynecology;  Laterality: N/A;   ESOPHAGEAL DILATION     Stomach dilation after gastric surgery   GASTRIC RESTRICTION SURGERY     x3   LAPAROSCOPIC  APPENDECTOMY N/A 10/25/2013   Procedure: APPENDECTOMY LAPAROSCOPIC;  Surgeon: Jamesetta So, MD;  Location[facility];  Service: General;  Laterality: N/A;   SKIN CANCER EXCISION Left    Arm   TONSILLECTOMY     TUBAL LIGATION     UPPER GI ENDOSCOPY       Current Outpatient Medications  Medication Sig Dispense Refill   allopurinol (ZYLOPRIM) 300 MG tablet TAKE 1 TABLET BY MOUTH  DAILY 90 tablet 0   Empagliflozin-metFORMIN HCl (SYNJARDY) 12.03-999 MG TABS Take 1 tablet by mouth 2 (two) times daily. Most of the time its once daily 180 tablet 1   fenofibrate 160 MG tablet Take 1 tablet (160 mg total) by mouth daily. 90 tablet 0   glucose blood test strip Reli-on glucometer.  Test Blood sugar qid Dx. 250.01     Lancets (ONETOUCH ULTRASOFT) lancets Test 1X per day and prn   Dx 250.01 100 each 12   levothyroxine (SYNTHROID) 75 MCG tablet TAKE 1 TABLET BY MOUTH  DAILY BEFORE BREAKFAST 90 tablet 1   metoprolol succinate (TOPROL-XL) 25 MG 24 hr tablet Take 1 tablet (25 mg total) by mouth daily. 90 tablet 1   Omega-3 Fatty Acids (OMEGA 3 PO) Take 4 capsules by mouth daily.     omeprazole (PRILOSEC) 40 MG capsule Take 1 capsule (40 mg total) by mouth daily. 90 capsule 1   Semaglutide, 1 MG/DOSE, (OZEMPIC, 1 MG/DOSE,) 4 MG/3ML SOPN Inject 1 mg into the skin once a week.     No current facility-administered medications for this visit.    Allergies:   Statins    Social History:  The patient  reports that she quit smoking about 12 years ago. Her smoking use included cigarettes. She has never used smokeless tobacco. She reports current alcohol use. She reports that she does not use drugs.   Family History:  The patient's family history includes Alzheimer's disease in her maternal grandfather; Arthritis in her maternal grandmother and mother; Fibromyalgia in her mother; Gout in her daughter and son; Heart disease in her maternal grandmother; Hyperlipidemia in her mother.    ROS:  Please see the history  of present illness.   Otherwise, review of systems are positive for none.   All other systems are reviewed and negative.    PHYSICAL EXAM: VS:  BP 132/84   Pulse 86   Ht 5\' 3"  (1.6 m)   Wt 233 lb 12.8 oz (106.1 kg)   LMP 06/29/2017   SpO2 99%   BMI 41.42 kg/m  , BMI Body mass index is 41.42 kg/m. GENERAL:  Well appearing HEENT:  Pupils equal round and reactive, fundi not visualized, oral mucosa unremarkable NECK:  No jugular venous distention, waveform within normal limits, carotid upstroke brisk and symmetric, no bruits, no thyromegaly LYMPHATICS:  No cervical, inguinal adenopathy LUNGS:  Clear to auscultation bilaterally BACK:  No CVA tenderness CHEST:  Unremarkable HEART:  PMI not displaced or sustained,S1 and S2 within normal limits, no S3, no S4, no clicks, no rubs, no murmurs ABD:  Flat, positive bowel sounds normal in frequency in pitch, no bruits, no rebound, no guarding, no midline pulsatile mass, no hepatomegaly, no splenomegaly EXT:  2 plus pulses throughout, no edema, no cyanosis no clubbing SKIN:  No rashes no nodules NEURO:  Cranial nerves II through XII grossly intact, motor grossly intact throughout PSYCH:  Cognitively intact, oriented to person place and time    EKG:  EKG is ordered today. The ekg ordered today demonstrates first degree heart block, PR 234 bpm   Recent Labs: 10/04/2021: ALT 25; BUN 18; Creatinine, Ser 1.12; Hemoglobin 14.3; Platelets 309; Potassium 4.4; Sodium 138; TSH 2.330    Lipid Panel    Component Value Date/Time   CHOL 189 10/04/2021 1131   CHOL 183 02/01/2013 1244   TRIG 232 (H) 10/04/2021 1131   TRIG 401 (H) 11/30/2016 0907   TRIG 217 (H) 02/01/2013 1244   HDL 43 10/04/2021 1131   HDL 39 (L) 11/30/2016 0907   HDL 43 02/01/2013 1244   CHOLHDL 4.4 10/04/2021 1131   LDLCALC 106 (H) 10/04/2021 1131   LDLCALC 107 (H) 05/02/2014 1640   LDLCALC 97 02/01/2013 1244      Wt Readings from Last 3 Encounters:  10/23/21 233 lb 12.8  oz (106.1 kg)  10/04/21 233 lb 6.4 oz (105.9 kg)  04/05/21 241 lb 6.4 oz (109.5 kg)      Other studies Reviewed: Additional studies/ records that were reviewed today include: See above.  Review of the above records demonstrates:  Please see elsewhere in the note.    ASSESSMENT AND PLAN:  Precordial chest pain:  Given risk factors, would recommend exercise stress test and follow up with cardiology in 2 months. ASCVD 6.6%, no aspirin required. Risk factors include HTN, T2DM, HLD, OSA.  No prior calcium score.   DM:  Last A1c May 2022. Recommend recheck with PCP or endocrinologist. Continue with glucose control and lifestyle modifications.   HTN:  BP not at goal today, 132/84. Goal <120/<80. Only related med is metoprolol. Recommend PCP consider additional medication such as amlodipine 5 mg, taper as needed.   Dyslipidemia:  Primary prevention, LDL goal <100. Currently on fenofibrate and omega-3 fatty acids. Given patient is so close to goal, recommend dietary modifications.    Current medicines are reviewed at length with the patient today.  The patient does not have concerns regarding medicines.  The following changes have been made:  no change  Labs/ tests ordered today include: treadmill exercise stress test  Orders Placed This Encounter  Procedures   Cardiac Stress Test: Informed Consent Details: Physician/Practitioner Attestation; Transcribe to consent form and obtain patient signature   EXERCISE TOLERANCE TEST (ETT)   EKG 12-Lead      Disposition:   FU with cardiology in 2 months.    Signed, Ezequiel Essex, MD  PGY-2 [place] [facility] 10/23/2021 1:39 PM    [facility]  History and all data above reviewed.  Patient examined.  I agree with the findings as above.  The patient has chest discomfort as described above.  She does have significant cardiovascular risk factors.  Her pain is nonanginal greater than anginal.  He seems  to be at rest.  There is no associated symptoms.  She has not had prior cardiovascular work-up.  The patient exam reveals COR: Regular rate and  rhythm, no murmurs,  Lungs: Clear to auscultation bilaterally,  Abd: Positive bowel sounds normal in frequency and pitch, bruits, rebound, guarding, Ext 2+ pulses, no edema.  All available labs, radiology testing, previous records reviewed. Agree with documented assessment and plan.  Chest discomfort: Her chest discomfort has nonanginal greater than anginal features.  I think the pretest probability of obstructive coronary disease is low to moderate.  However, she does have longstanding diabetes as a risk factor.  I will bring the patient back for a POET (Plain Old Exercise Test). This will allow me to screen for obstructive coronary disease, risk stratify and very importantly provide a prescription for exercise. Jeneen Rinks Hochrein  3:13 PM  10/23/2021

## 2021-10-23 NOTE — Patient Instructions (Signed)
Medication Instructions:  Your Physician recommend you continue on your current medication as directed.    *If you need a refill on your cardiac medications before your next appointment, please call your pharmacy*    Testing/Procedures:   [facility] at [facility] [address], [place], [place] [address] Phone:  [phone]      You are scheduled for an Exercise Stress Test on ___ at ___  Please arrive 15 minutes prior to your appointment time for registration and insurance purposes.  The test will take approximately 45 minutes to complete.  How to prepare for your Exercise Stress Test: Do bring a list of your current medications with you.  If not listed below, you may take your medications as normal. Do not take metoprolol (Lopressor, Toprol) for 24 hours prior to the test.  Bring the medication to your appointment as you may be required to take it once the test is complete. Do wear comfortable clothes (no dresses or overalls) and walking shoes, tennis shoes preferred (no heels or open toed shoes are allowed) Do Not wear cologne, perfume, aftershave or lotions (deodorant is allowed). Please report to [address], [address] for your test.   If these instructions are not followed, your test will have to be rescheduled.  If you have questions or concerns about your appointment, you can call the Stress Lab at [phone].  If you cannot keep your appointment, please provide 24 hours notification to the Stress Lab, to avoid a possible $50 charge to your account    Follow-Up: At [facility], you and your health needs are our priority.  As part of our continuing mission to provide you with exceptional heart care, we have created designated Provider Care Teams.  These Care Teams include your primary Cardiologist (physician) and Advanced Practice Providers (APPs -  Physician Assistants and Nurse Practitioners) who all work  together to provide you with the care you need, when you need it.  We recommend signing up for the patient portal called "MyChart".  Sign up information is provided on this After Visit Summary.  MyChart is used to connect with patients for Virtual Visits (Telemedicine).  Patients are able to view lab/test results, encounter notes, upcoming appointments, etc.  Non-urgent messages can be sent to your provider as well.   To learn more about what you can do with MyChart, go to [email or web address].    Your next appointment:   2 month(s)  The format for your next appointment:   In Person  Provider:   Dr. Vita Barley, MD

## 2021-11-16 ENCOUNTER — Telehealth (HOSPITAL_COMMUNITY): Payer: Self-pay | Admitting: *Deleted

## 2021-11-16 NOTE — Telephone Encounter (Signed)
Close encounter 

## 2021-11-20 ENCOUNTER — Other Ambulatory Visit: Payer: Self-pay

## 2021-11-20 ENCOUNTER — Encounter: Payer: Self-pay | Admitting: Cardiology

## 2021-11-20 ENCOUNTER — Ambulatory Visit (HOSPITAL_COMMUNITY)
Admission: RE | Admit: 2021-11-20 | Discharge: 2021-11-20 | Disposition: A | Discharge status: Home / Self Care | Payer: BC Managed Care – PPO | Source: Ambulatory Visit | Attending: Internal Medicine | Admitting: Internal Medicine

## 2021-11-20 DIAGNOSIS — E785 Hyperlipidemia, unspecified: Secondary | ICD-10-CM

## 2021-11-20 DIAGNOSIS — E118 Type 2 diabetes mellitus with unspecified complications: Secondary | ICD-10-CM

## 2021-11-20 DIAGNOSIS — I10 Essential (primary) hypertension: Secondary | ICD-10-CM

## 2021-11-20 DIAGNOSIS — R072 Precordial pain: Secondary | ICD-10-CM

## 2021-11-22 ENCOUNTER — Telehealth (HOSPITAL_COMMUNITY): Payer: Self-pay | Admitting: *Deleted

## 2021-11-22 NOTE — Telephone Encounter (Signed)
Close encounter 

## 2021-11-23 ENCOUNTER — Ambulatory Visit (HOSPITAL_COMMUNITY)
Admission: RE | Admit: 2021-11-23 | Discharge: 2021-11-23 | Disposition: A | Discharge status: Home / Self Care | Payer: BC Managed Care – PPO | Source: Ambulatory Visit | Attending: Cardiology | Admitting: Cardiology

## 2021-11-23 ENCOUNTER — Other Ambulatory Visit: Payer: Self-pay

## 2021-11-23 DIAGNOSIS — E118 Type 2 diabetes mellitus with unspecified complications: Secondary | ICD-10-CM

## 2021-11-23 DIAGNOSIS — R072 Precordial pain: Secondary | ICD-10-CM | POA: Diagnosis not present

## 2021-11-23 DIAGNOSIS — E785 Hyperlipidemia, unspecified: Secondary | ICD-10-CM | POA: Diagnosis not present

## 2021-11-23 DIAGNOSIS — I10 Essential (primary) hypertension: Secondary | ICD-10-CM | POA: Diagnosis not present

## 2021-11-23 LAB — EXERCISE TOLERANCE TEST
Angina Index: 0
Estimated workload: 7
Exercise duration (min): 6 min
Exercise duration (sec): 0 s
MPHR: 165 {beats}/min
Peak HR: 142 {beats}/min
Percent HR: 86 %
Rest HR: 75 {beats}/min

## 2021-11-28 ENCOUNTER — Encounter: Payer: Self-pay | Admitting: Cardiology

## 2021-12-23 NOTE — Progress Notes (Deleted)
Cardiology Office Note  Date:  12/23/2021   ID:  Blia, Totman 13-Jul-1966, MRN [id number]  PCP:  Sharion Balloon, FNP  Cardiologist:   Minus Breeding, MD Referring:  Evelina Dun, FNP  No chief complaint on file.    History of Present Illness: Holly Rodriguez is a 56 y.o. female who  is referred by Evelina Dun, NP, for evaluation of chest pain.   She had a negative POET ([employer] Exercise Treadmill).   ***    ***  At PCP visit 11/17, provider notes 2 months history of intermittent chest pain. Described to PCP as aching lateral chest pain that radiates to left shoulder. Associated with fatigue. Notably, pain aggravated by food. Patient has been experiencing increased anxiety with her grandmother being placed in hospice and her son and grandchild living with her.   Labs collected by PCP 11/17 demonstrate creatinine consistent with known CKD, LDL 106, triglycerides 232 (confirmed with patient that lab was taken while fasting). TSH within normal limits. CBC without evidence of anemia. Iron levels normal.   Pertinent current meds include fenofibrate 160 mg, metoprolol succ 25 mg, omeprazole, empagliflozin-metformin 12.03-999 mg, Ozempic, omega-3 fatty acids.   EKG 11/17 demonstrates sinus rhythm 84 bpm with first degree heart block (PR 240 ms), no ST segment abnormalities. No ECHO on file in Epic.   EKG today in cardiology clinic demonstrates first degree heart block, PR 234 bpm  HPI - son and grandson have moved in six months ago and "life has been chaotic" ever since - 3/10 pain - intermittent onset, no predictable pattern, possibly after she eats - forgets medications sometimes, takes them late, misses a whole day sometimes - duration: longer than 2 months, just more noticeable in the last 2 months - relieving factors: none noted by patient - aggravating factors: none noted by patient - radiates to left shoulder and back - denies concurrent diaphoresis,  pre-syncope, SOB - denies worsening with exertion or relief with rest - associated symptoms: headache (unsure if from heater at work), but happens independent of chest pain - no personal cardiac history  FMHx: maternal grandma with congestive heart failure, no heart history in children or grandchildren  PMHx:  -- T2DM takes synjardy once a day instead of prescribed twice a day (forgets often) -- HTN: takes metoprolol daily, no issues -- OSA: uses CPAP nightly, "couldn't sleep without it" -- HLD: fenofibrate daily, no issues with forgetting (unless forgets whole day, issues with statins making her legs hurt)  Past Medical History:  Diagnosis Date   Anxiety    Bulging of intervertebral disc between L4 and L5    CKD (chronic kidney disease), stage III ([place])    Due to Diabetes   Diabetes mellitus without complication ([place])    Family history of adverse reaction to anesthesia    mother post op nausea and vomiting   Fatty liver    GERD (gastroesophageal reflux disease)    Gout    Herpes    Cold sores   History of appendicitis 2014   History of colon polyps    History of migraine    Hyperlipidemia    Hypertension    Hypothyroidism    IBS (irritable bowel syndrome)    Iron deficiency anemia    Numbness and tingling of both lower extremities    Right is greather than left   Obesity    Peripheral neuropathy    Postmenopausal bleeding    Preseptal cellulitis of left  eye 11/06/2019   Seasonal allergies    Skin cancer    left arm   Sleep apnea    Uterine polyp     Past Surgical History:  Procedure Laterality Date   CESAREAN SECTION     x2   CHOLECYSTECTOMY     COLONOSCOPY  11/23/2018   DILATATION & CURETTAGE/HYSTEROSCOPY WITH MYOSURE N/A 11/25/2019   Procedure: DILATATION & CURETTAGE/HYSTEROSCOPY WITH MYOSURE;  Surgeon: Brien Few, MD;  Location: [facility];  Service: Gynecology;  Laterality: N/A;   ESOPHAGEAL DILATION     Stomach dilation after gastric  surgery   GASTRIC RESTRICTION SURGERY     x3   LAPAROSCOPIC APPENDECTOMY N/A 10/25/2013   Procedure: APPENDECTOMY LAPAROSCOPIC;  Surgeon: Jamesetta So, MD;  Location: [facility];  Service: General;  Laterality: N/A;   SKIN CANCER EXCISION Left    Arm   TONSILLECTOMY     TUBAL LIGATION     UPPER GI ENDOSCOPY       Current Outpatient Medications  Medication Sig Dispense Refill   allopurinol (ZYLOPRIM) 300 MG tablet TAKE 1 TABLET BY MOUTH  DAILY 90 tablet 0   Empagliflozin-metFORMIN HCl (SYNJARDY) 12.03-999 MG TABS Take 1 tablet by mouth 2 (two) times daily. Most of the time its once daily 180 tablet 1   fenofibrate 160 MG tablet Take 1 tablet (160 mg total) by mouth daily. 90 tablet 0   glucose blood test strip Reli-on glucometer.  Test Blood sugar qid Dx. 250.01     Lancets (ONETOUCH ULTRASOFT) lancets Test 1X per day and prn   Dx 250.01 100 each 12   levothyroxine (SYNTHROID) 75 MCG tablet TAKE 1 TABLET BY MOUTH  DAILY BEFORE BREAKFAST 90 tablet 1   metoprolol succinate (TOPROL-XL) 25 MG 24 hr tablet Take 1 tablet (25 mg total) by mouth daily. 90 tablet 1   Omega-3 Fatty Acids (OMEGA 3 PO) Take 4 capsules by mouth daily.     omeprazole (PRILOSEC) 40 MG capsule Take 1 capsule (40 mg total) by mouth daily. 90 capsule 1   Semaglutide, 1 MG/DOSE, (OZEMPIC, 1 MG/DOSE,) 4 MG/3ML SOPN Inject 1 mg into the skin once a week.     No current facility-administered medications for this visit.    Allergies:   Statins    ROS:  Please see the history of present illness.   Otherwise, review of systems are positive for ***.   All other systems are reviewed and negative.    PHYSICAL EXAM: VS:  LMP 06/29/2017  , BMI There is no height or weight on file to calculate BMI. GENERAL:  Well appearing NECK:  No jugular venous distention, waveform within normal limits, carotid upstroke brisk and symmetric, no bruits, no thyromegaly LUNGS:  Clear to auscultation bilaterally CHEST:  Unremarkable HEART:  PMI  not displaced or sustained,S1 and S2 within normal limits, no S3, no S4, no clicks, no rubs, *** murmurs ABD:  Flat, positive bowel sounds normal in frequency in pitch, no bruits, no rebound, no guarding, no midline pulsatile mass, no hepatomegaly, no splenomegaly EXT:  2 plus pulses throughout, no edema, no cyanosis no clubbing    ***GENERAL:  Well appearing HEENT:  Pupils equal round and reactive, fundi not visualized, oral mucosa unremarkable NECK:  No jugular venous distention, waveform within normal limits, carotid upstroke brisk and symmetric, no bruits, no thyromegaly LYMPHATICS:  No cervical, inguinal adenopathy LUNGS:  Clear to auscultation bilaterally BACK:  No CVA tenderness CHEST:  Unremarkable HEART:  PMI  not displaced or sustained,S1 and S2 within normal limits, no S3, no S4, no clicks, no rubs, no murmurs ABD:  Flat, positive bowel sounds normal in frequency in pitch, no bruits, no rebound, no guarding, no midline pulsatile mass, no hepatomegaly, no splenomegaly EXT:  2 plus pulses throughout, no edema, no cyanosis no clubbing SKIN:  No rashes no nodules NEURO:  Cranial nerves II through XII grossly intact, motor grossly intact throughout PSYCH:  Cognitively intact, oriented to person place and time    EKG:  EKG is *** ordered today. ***   Recent Labs: 10/04/2021: ALT 25; BUN 18; Creatinine, Ser 1.12; Hemoglobin 14.3; Platelets 309; Potassium 4.4; Sodium 138; TSH 2.330    Lipid Panel    Component Value Date/Time   CHOL 189 10/04/2021 1131   CHOL 183 02/01/2013 1244   TRIG 232 (H) 10/04/2021 1131   TRIG 401 (H) 11/30/2016 0907   TRIG 217 (H) 02/01/2013 1244   HDL 43 10/04/2021 1131   HDL 39 (L) 11/30/2016 0907   HDL 43 02/01/2013 1244   CHOLHDL 4.4 10/04/2021 1131   LDLCALC 106 (H) 10/04/2021 1131   LDLCALC 107 (H) 05/02/2014 1640   LDLCALC 97 02/01/2013 1244      Wt Readings from Last 3 Encounters:  10/23/21 233 lb 12.8 oz (106.1 kg)  10/04/21 233 lb  6.4 oz (105.9 kg)  04/05/21 241 lb 6.4 oz (109.5 kg)      Other studies Reviewed: Additional studies/ records that were reviewed today include: *** Review of the above records demonstrates:  Please see elsewhere in the note.    ASSESSMENT AND PLAN:  Precordial chest pain:  ***  Given risk factors, would recommend exercise stress test and follow up with cardiology in 2 months. ASCVD 6.6%, no aspirin required. Risk factors include HTN, T2DM, HLD, OSA.  No prior calcium score.   DM:  Last A1c was *** May 2022. Recommend recheck with PCP or endocrinologist. Continue with glucose control and lifestyle modifications.   HTN:  BP is ***  not at goal today, 132/84. Goal <120/<80. Only related med is metoprolol. Recommend PCP consider additional medication such as amlodipine 5 mg, taper as needed.   Dyslipidemia:    LDL was *** Primary prevention, LDL goal <100. Currently on fenofibrate and omega-3 fatty acids. Given patient is so close to goal, recommend dietary modifications.    Current medicines are reviewed at length with the patient today.  The patient does not have concerns regarding medicines.  The following changes have been made:  ***  Labs/ tests ordered today include: ***

## 2021-12-24 ENCOUNTER — Ambulatory Visit: Payer: BC Managed Care – PPO | Admitting: Cardiology

## 2021-12-24 DIAGNOSIS — I10 Essential (primary) hypertension: Secondary | ICD-10-CM

## 2021-12-24 DIAGNOSIS — E118 Type 2 diabetes mellitus with unspecified complications: Secondary | ICD-10-CM

## 2021-12-24 DIAGNOSIS — E785 Hyperlipidemia, unspecified: Secondary | ICD-10-CM

## 2021-12-24 DIAGNOSIS — R072 Precordial pain: Secondary | ICD-10-CM

## 2022-01-03 ENCOUNTER — Ambulatory Visit: Payer: BC Managed Care – PPO | Admitting: Family

## 2022-01-17 ENCOUNTER — Encounter: Payer: Self-pay | Admitting: Family

## 2022-01-17 ENCOUNTER — Ambulatory Visit: Payer: BC Managed Care – PPO | Admitting: Family

## 2022-01-17 VITALS — BP 128/74 | HR 82 | Temp 97.4°F | Ht 63.0 in | Wt 241.8 lb

## 2022-01-17 DIAGNOSIS — F411 Generalized anxiety disorder: Secondary | ICD-10-CM

## 2022-01-17 DIAGNOSIS — E1122 Type 2 diabetes mellitus with diabetic chronic kidney disease: Secondary | ICD-10-CM

## 2022-01-17 DIAGNOSIS — N1832 Chronic kidney disease, stage 3b: Secondary | ICD-10-CM

## 2022-01-17 DIAGNOSIS — G4733 Obstructive sleep apnea (adult) (pediatric): Secondary | ICD-10-CM

## 2022-01-17 DIAGNOSIS — M2550 Pain in unspecified joint: Secondary | ICD-10-CM

## 2022-01-17 DIAGNOSIS — E079 Disorder of thyroid, unspecified: Secondary | ICD-10-CM | POA: Diagnosis not present

## 2022-01-17 DIAGNOSIS — Z79899 Other long term (current) drug therapy: Secondary | ICD-10-CM

## 2022-01-17 DIAGNOSIS — I152 Hypertension secondary to endocrine disorders: Secondary | ICD-10-CM | POA: Diagnosis not present

## 2022-01-17 DIAGNOSIS — M109 Gout, unspecified: Secondary | ICD-10-CM

## 2022-01-17 DIAGNOSIS — N183 Chronic kidney disease, stage 3 unspecified: Secondary | ICD-10-CM

## 2022-01-17 DIAGNOSIS — E1159 Type 2 diabetes mellitus with other circulatory complications: Secondary | ICD-10-CM

## 2022-01-17 DIAGNOSIS — E611 Iron deficiency: Secondary | ICD-10-CM

## 2022-01-17 DIAGNOSIS — E1169 Type 2 diabetes mellitus with other specified complication: Secondary | ICD-10-CM | POA: Diagnosis not present

## 2022-01-17 DIAGNOSIS — E1165 Type 2 diabetes mellitus with hyperglycemia: Secondary | ICD-10-CM

## 2022-01-17 DIAGNOSIS — Z23 Encounter for immunization: Secondary | ICD-10-CM

## 2022-01-17 DIAGNOSIS — E785 Hyperlipidemia, unspecified: Secondary | ICD-10-CM

## 2022-01-17 DIAGNOSIS — R5383 Other fatigue: Secondary | ICD-10-CM

## 2022-01-17 MED ORDER — FENOFIBRATE 160 MG PO TABS: 160.0000 mg | ORAL_TABLET | Freq: Every day | ORAL | 0 refills | Status: DC

## 2022-01-17 MED ORDER — METOPROLOL SUCCINATE ER 25 MG PO TB24
25.0000 mg | ORAL_TABLET | Freq: Every day | ORAL | 1 refills | Status: DC
Start: 2022-01-17 — End: 2022-06-11

## 2022-01-17 MED ORDER — SYNJARDY 12.5-1000 MG PO TABS: 1.0000 | ORAL_TABLET | Freq: Two times a day (BID) | ORAL | 1 refills | Status: DC

## 2022-01-17 MED ORDER — OMEPRAZOLE 40 MG PO CPDR: 40.0000 mg | DELAYED_RELEASE_CAPSULE | Freq: Every day | ORAL | 1 refills | Status: DC

## 2022-01-17 MED ORDER — LEVOTHYROXINE SODIUM 75 MCG PO TABS: ORAL_TABLET | ORAL | 1 refills | Status: DC

## 2022-01-17 MED ORDER — ALLOPURINOL 300 MG PO TABS: ORAL_TABLET | ORAL | 0 refills | Status: DC

## 2022-01-17 MED ORDER — DICLOFENAC SODIUM 75 MG PO TBEC: 75.0000 mg | DELAYED_RELEASE_TABLET | Freq: Two times a day (BID) | ORAL | 3 refills | Status: DC

## 2022-01-17 NOTE — Patient Instructions (Signed)
Osteoarthritis Osteoarthritis is a type of arthritis. It refers to joint pain or joint disease. Osteoarthritis affects tissue that covers the ends of bones in joints (cartilage). Cartilage acts as a cushion between the bones and helps them move smoothly. Osteoarthritis occurs when cartilage in the joints gets worn down. Osteoarthritis is sometimes called "wear and tear" arthritis. Osteoarthritis is the most common form of arthritis. It often occurs in older people. It is a condition that gets worse over time. The joints most often affected by this condition are in the fingers, toes, hips, knees, and spine, including the neck and lower back. What are the causes? This condition is caused by the wearing down of cartilage that covers the ends of bones. What increases the risk? The following factors may make you more likely to develop this condition: Being age 50 or older. Obesity. Overuse of joints. Past injury of a joint. Past surgery on a joint. Family history of osteoarthritis. What are the signs or symptoms? The main symptoms of this condition are pain, swelling, and stiffness in the joint. Other symptoms may include: An enlarged joint. More pain and further damage caused by small pieces of bone or cartilage that break off and float inside of the joint. Small deposits of bone (osteophytes) that grow on the edges of the joint. A grating or scraping feeling inside the joint when you move it. Popping or creaking sounds when you move. Difficulty walking or exercising. An inability to grip items, twist your hand(s), or control the movements of your hands and fingers. How is this diagnosed? This condition may be diagnosed based on: Your medical history. A physical exam. Your symptoms. X-rays of the affected joint(s). Blood tests to rule out other types of arthritis. How is this treated? There is no cure for this condition, but treatment can help control pain and improve joint function.  Treatment may include a combination of therapies, such as: Pain relief techniques, such as: Applying heat and cold to the joint. Massage. A form of talk therapy called cognitive behavioral therapy (CBT). This therapy helps you set goals and follow up on the changes that you make. Medicines for pain and inflammation. The medicines can be taken by mouth or applied to the skin. They include: NSAIDs, such as ibuprofen. Prescription medicines. Strong anti-inflammatory medicines (corticosteroids). Certain nutritional supplements. A prescribed exercise program. You may work with a physical therapist. Assistive devices, such as a brace, wrap, splint, specialized glove, or cane. A weight control plan. Surgery, such as: An osteotomy. This is done to reposition the bones and relieve pain or to remove loose pieces of bone and cartilage. Joint replacement surgery. You may need this surgery if you have advanced osteoarthritis. Follow these instructions at home: Activity Rest your affected joints as told by your health care provider. Exercise as told by your health care provider. He or she may recommend specific types of exercise, such as: Strengthening exercises. These are done to strengthen the muscles that support joints affected by arthritis. Aerobic activities. These are exercises, such as brisk walking or water aerobics, that increase your heart rate. Range-of-motion activities. These help your joints move more easily. Balance and agility exercises. Managing pain, stiffness, and swelling   If directed, apply heat to the affected area as often as told by your health care provider. Use the heat source that your health care provider recommends, such as a moist heat pack or a heating pad. If you have a removable assistive device, remove it as told by   your health care provider. Place a towel between your skin and the heat source. If your health care provider tells you to keep the assistive device on  while you apply heat, place a towel between the assistive device and the heat source. Leave the heat on for 20-30 minutes. Remove the heat if your skin turns bright red. This is especially important if you are unable to feel pain, heat, or cold. You may have a greater risk of getting burned. If directed, put ice on the affected area. To do this: If you have a removable assistive device, remove it as told by your health care provider. Put ice in a plastic bag. Place a towel between your skin and the bag. If your health care provider tells you to keep the assistive device on during icing, place a towel between the assistive device and the bag. Leave the ice on for 20 minutes, 2-3 times a day. Move your fingers or toes often to reduce stiffness and swelling. Raise (elevate) the injured area above the level of your heart while you are sitting or lying down. General instructions Take over-the-counter and prescription medicines only as told by your health care provider. Maintain a healthy weight. Follow instructions from your health care provider for weight control. Do not use any products that contain nicotine or tobacco, such as cigarettes, e-cigarettes, and chewing tobacco. If you need help quitting, ask your health care provider. Use assistive devices as told by your health care provider. Keep all follow-up visits as told by your health care provider. This is important. Where to find more information National Institute of Arthritis and Musculoskeletal and Skin Diseases: www.niams.nih.gov National Institute on Aging: www.nia.nih.gov American College of Rheumatology: www.rheumatology.org Contact a health care provider if: You have redness, swelling, or a feeling of warmth in a joint that gets worse. You have a fever along with joint or muscle aches. You develop a rash. You have trouble doing your normal activities. Get help right away if: You have pain that gets worse and is not relieved by  pain medicine. Summary Osteoarthritis is a type of arthritis that affects tissue covering the ends of bones in joints (cartilage). This condition is caused by the wearing down of cartilage that covers the ends of bones. The main symptom of this condition is pain, swelling, and stiffness in the joint. There is no cure for this condition, but treatment can help control pain and improve joint function. This information is not intended to replace advice given to you by your health care provider. Make sure you discuss any questions you have with your health care provider. Document Revised: 11/01/2019 Document Reviewed: 11/01/2019 Elsevier Patient Education  2022 Elsevier Inc.  

## 2022-01-17 NOTE — Progress Notes (Signed)
? ?Subjective:  ? ? Patient ID: Holly Rodriguez, female    DOB: 1966-04-20, 56 y.o.   MRN: [id number] ? ?Chief Complaint  ?Patient presents with  ? Fatigue  ? Medical Management of Chronic Issues  ? Generalized Body Aches  ?  Comes and goes no right side.  ? ?Pt presents to the office today for chronic follow up.  She is followed by Endocrinologists for DM. She has CKD and limits NSAID's. Has seen Nephrologists, but was released. She is followed by Cardiologists.  ? ?She has had increased anxiety with her grandmother passed away and her son and grandchild living with her.  ?  ?She is morbid obese with BMI of 42. She has OSA and uses CPAP nightly. This is stable. ? ?Complaining of increase fatigue.   ?Diabetes ?She presents for her follow-up diabetic visit. She has type 2 diabetes mellitus. Hypoglycemia symptoms include nervousness/anxiousness. Associated symptoms include fatigue. Pertinent negatives for diabetes include no blurred vision and no foot paresthesias. Symptoms are stable. Diabetic complications include nephropathy and peripheral neuropathy. Pertinent negatives for diabetic complications include no heart disease. Risk factors for coronary artery disease include dyslipidemia, diabetes mellitus, hypertension, sedentary lifestyle and post-menopausal. She is following a generally healthy diet. (Does not check glucose at home) Eye exam is not current.  ?Hypertension ?This is a chronic problem. The current episode started more than 1 year ago. The problem has been resolved since onset. The problem is controlled. Associated symptoms include anxiety and malaise/fatigue. Pertinent negatives include no blurred vision, peripheral edema or shortness of breath. Risk factors for coronary artery disease include dyslipidemia, diabetes mellitus, obesity and sedentary lifestyle. The current treatment provides moderate improvement. Identifiable causes of hypertension include a thyroid problem.  ?Thyroid Problem ?Presents  for follow-up visit. Symptoms include anxiety, depressed mood and fatigue. Patient reports no dry skin or hoarse voice. The symptoms have been stable. Her past medical history is significant for hyperlipidemia.  ?Hyperlipidemia ?This is a chronic problem. The current episode started more than 1 year ago. The problem is uncontrolled. Exacerbating diseases include obesity. Pertinent negatives include no shortness of breath. Current antihyperlipidemic treatment includes herbal therapy. The current treatment provides moderate improvement of lipids. Risk factors for coronary artery disease include dyslipidemia, diabetes mellitus, hypertension, a sedentary lifestyle and post-menopausal.  ?Anxiety ?Presents for follow-up visit. Symptoms include depressed mood, excessive worry, irritability, nervous/anxious behavior and restlessness. Patient reports no shortness of breath.  ? ? ?Arthritis ?Presents for initial visit. Affected locations include the right shoulder, right elbow, right MCP and right ankle. Her pain is at a severity of 6/10. Associated symptoms include fatigue.  ? ? ? ?Review of Systems  ?Constitutional:  Positive for fatigue, irritability and malaise/fatigue.  ?HENT:  Negative for hoarse voice.   ?Eyes:  Negative for blurred vision.  ?Respiratory:  Negative for shortness of breath.   ?Musculoskeletal:  Positive for arthritis.  ?Psychiatric/Behavioral:  The patient is nervous/anxious.   ?All other systems reviewed and are negative. ? ?   ?Objective:  ? Physical Exam ?Vitals reviewed.  ?Constitutional:   ?   General: She is not in acute distress. ?   Appearance: She is well-developed. She is obese.  ?HENT:  ?   Head: Normocephalic and atraumatic.  ?   Right Ear: Tympanic membrane normal.  ?   Left Ear: Tympanic membrane normal.  ?Eyes:  ?   Pupils: Pupils are equal, round, and reactive to light.  ?Neck:  ?   Thyroid: No thyromegaly.  ?  Cardiovascular:  ?   Rate and Rhythm: Normal rate and regular rhythm.  ?    Heart sounds: Normal heart sounds. No murmur heard. ?Pulmonary:  ?   Effort: Pulmonary effort is normal. No respiratory distress.  ?   Breath sounds: Normal breath sounds. No wheezing.  ?Abdominal:  ?   General: Bowel sounds are normal. There is no distension.  ?   Palpations: Abdomen is soft.  ?   Tenderness: There is no abdominal tenderness.  ?Musculoskeletal:     ?   General: No tenderness. Normal range of motion.  ?   Cervical back: Normal range of motion and neck supple.  ?   Right lower leg: Edema (trace) present.  ?   Left lower leg: Edema (trace) present.  ?Skin: ?   General: Skin is warm and dry.  ?Neurological:  ?   Mental Status: She is alert and oriented to person, place, and time.  ?   Cranial Nerves: No cranial nerve deficit.  ?   Deep Tendon Reflexes: Reflexes are normal and symmetric.  ?Psychiatric:     ?   Behavior: Behavior normal.     ?   Thought Content: Thought content normal.     ?   Judgment: Judgment normal.  ? ? ? ? ?BP 128/74   Pulse 82   Temp (!) 97.4 ?F (36.3 ?C) (Temporal)   Ht 5' 3"  (1.6 m)   Wt 241 lb 12.8 oz (109.7 kg)   LMP 06/29/2017   BMI 42.83 kg/m?  ? ?   ?Assessment & Plan:  ?Holly Rodriguez comes in today with chief complaint of Fatigue, Medical Management of Chronic Issues, and Generalized Body Aches (Comes and goes no right side.) ? ? ?Diagnosis and orders addressed: ? ?1. Acute gout, unspecified cause, unspecified site ?- allopurinol (ZYLOPRIM) 300 MG tablet; TAKE 1 TABLET BY MOUTH  DAILY  Dispense: 90 tablet; Refill: 0 ?- CMP14+EGFR ? ?2. Hyperlipidemia associated with type 2 diabetes mellitus ([facility]) ?- fenofibrate 160 MG tablet; Take 1 tablet (160 mg total) by mouth daily.  Dispense: 90 tablet; Refill: 0 ?- CMP14+EGFR ? ?3. Thyroid disease ?- levothyroxine (SYNTHROID) 75 MCG tablet; TAKE 1 TABLET BY MOUTH  DAILY BEFORE BREAKFAST  Dispense: 90 tablet; Refill: 1 ?- CMP14+EGFR ?- TSH ? ?4. Type 2 diabetes mellitus with hyperglycemia, without long-term current use of  insulin ([place]) ? ?- Empagliflozin-metFORMIN HCl (SYNJARDY) 12.03-999 MG TABS; Take 1 tablet by mouth 2 (two) times daily. Most of the time its once daily  Dispense: 180 tablet; Refill: 1 ?- CMP14+EGFR ? ?5. Hypertension associated with diabetes ([place]) ?- CMP14+EGFR ? ?6. OSA (obstructive sleep apnea) ?- CMP14+EGFR ? ?7. CKD stage 3 due to type 2 diabetes mellitus ([place]) ?- CMP14+EGFR ? ?8. Stage 3b chronic kidney disease ([place]) ?- CMP14+EGFR ? ?9. Morbid obesity ([place]) ?- CMP14+EGFR ? ?10. Controlled substance agreement signed ?- CMP14+EGFR ? ?11. Dyslipidemia ?- CMP14+EGFR ? ?12. GAD (generalized anxiety disorder) ?- CMP14+EGFR ? ?13. Iron deficiency ?- CMP14+EGFR ?- Anemia Profile B ? ?14. Other fatigue ?- CMP14+EGFR ?- Anemia Profile B ?- TSH ? ?15. Need for shingles vaccine ?- Varicella-zoster vaccine IM (Shingrix) ?- CMP14+EGFR ? ?16. Arthralgia, unspecified joint ?Start diclofenac BID with food ?No other NSAID's  ?- diclofenac (VOLTAREN) 75 MG EC tablet; Take 1 tablet (75 mg total) by mouth 2 (two) times daily.  Dispense: 60 tablet; Refill: 3 ? ? ?Labs pending ?Health Maintenance reviewed ?Diet and exercise encouraged ? ?Follow up plan: ?6  months  ? ?Evelina Dun, FNP ? ? ?

## 2022-01-18 LAB — ANEMIA PROFILE B
Basophils Absolute: 0.1 10*3/uL (ref 0.0–0.2)
Basos: 1 %
EOS (ABSOLUTE): 0.2 10*3/uL (ref 0.0–0.4)
Eos: 3 %
Ferritin: 20 ng/mL (ref 15–150)
Folate: 2.9 ng/mL — ABNORMAL LOW (ref >3.0)
Hematocrit: 41.6 % (ref 34.0–46.6)
Hemoglobin: 14.1 g/dL (ref 11.1–15.9)
Immature Grans (Abs): 0.1 10*3/uL (ref 0.0–0.1)
Immature Granulocytes: 1 %
Iron Saturation: 12 % — ABNORMAL LOW (ref 15–55)
Iron: 56 ug/dL (ref 27–159)
Lymphocytes Absolute: 4 10*3/uL — ABNORMAL HIGH (ref 0.7–3.1)
Lymphs: 44 %
MCH: 29.4 pg (ref 26.6–33.0)
MCHC: 33.9 g/dL (ref 31.5–35.7)
MCV: 87 fL (ref 79–97)
Monocytes Absolute: 0.5 10*3/uL (ref 0.1–0.9)
Monocytes: 6 %
Neutrophils Absolute: 4.3 10*3/uL (ref 1.4–7.0)
Neutrophils: 45 %
Platelets: 285 10*3/uL (ref 150–450)
RBC: 4.8 x10E6/uL (ref 3.77–5.28)
RDW: 13.5 % (ref 11.7–15.4)
Retic Ct Pct: 1.9 % (ref 0.6–2.6)
Total Iron Binding Capacity: 480 ug/dL — ABNORMAL HIGH (ref 250–450)
UIBC: 424 ug/dL (ref 131–425)
Vitamin B-12: 413 pg/mL (ref 232–1245)
WBC: 9.3 10*3/uL (ref 3.4–10.8)

## 2022-01-18 LAB — CMP14+EGFR
ALT: 18 IU/L (ref 0–32)
AST: 16 IU/L (ref 0–40)
Albumin/Globulin Ratio: 1.5 (ref 1.2–2.2)
Albumin: 4.3 g/dL (ref 3.8–4.9)
Alkaline Phosphatase: 84 IU/L (ref 44–121)
BUN/Creatinine Ratio: 20 (ref 9–23)
BUN: 21 mg/dL (ref 6–24)
Bilirubin Total: <0.2 mg/dL (ref 0.0–1.2)
CO2: 20 mmol/L (ref 20–29)
Calcium: 9.7 mg/dL (ref 8.7–10.2)
Chloride: 99 mmol/L (ref 96–106)
Creatinine, Ser: 1.06 mg/dL — ABNORMAL HIGH (ref 0.57–1.00)
Globulin, Total: 2.8 g/dL (ref 1.5–4.5)
Glucose: 105 mg/dL — ABNORMAL HIGH (ref 70–99)
Potassium: 4.4 mmol/L (ref 3.5–5.2)
Sodium: 139 mmol/L (ref 134–144)
Total Protein: 7.1 g/dL (ref 6.0–8.5)
eGFR: 62 mL/min/{1.73_m2} (ref >59)

## 2022-01-18 LAB — TSH: TSH: 2.14 u[IU]/mL (ref 0.450–4.500)

## 2022-01-21 DIAGNOSIS — Z7985 Long-term (current) use of injectable non-insulin antidiabetic drugs: Secondary | ICD-10-CM | POA: Diagnosis not present

## 2022-01-21 DIAGNOSIS — E1122 Type 2 diabetes mellitus with diabetic chronic kidney disease: Secondary | ICD-10-CM | POA: Diagnosis not present

## 2022-01-21 DIAGNOSIS — Z7984 Long term (current) use of oral hypoglycemic drugs: Secondary | ICD-10-CM | POA: Diagnosis not present

## 2022-01-21 DIAGNOSIS — N183 Chronic kidney disease, stage 3 unspecified: Secondary | ICD-10-CM | POA: Diagnosis not present

## 2022-01-24 ENCOUNTER — Encounter: Payer: Self-pay | Admitting: Cardiology

## 2022-01-24 NOTE — Progress Notes (Signed)
?  ?Cardiology Office Note ? ?Date:  01/25/2022  ? ?ID:  Holly Rodriguez, DOB August 26, 1966, MRN [id number] ? ?PCP:  Sharion Balloon, FNP  ?Cardiologist:   Minus Breeding, MD ?Referring:  Holly Dun, FNP ? ?Chief Complaint  ?Patient presents with  ? Chest Pain  ? ?  ?History of Present Illness: ?Holly Rodriguez is a 56 y.o. female who  is referred by Holly Dun, NP, for evaluation of chest pain .    He was having chest pain late last year.   After the last visit I sent him for POET (Plain Old Exercise Treadmill).  This was negative.    ? ?She said that since I saw her grandmother died.  That was stressful.  She had noticed her son, daughter-in-law and 68-year-old living with her.  She was not taking her medicines.  She said she has not tried to take them more consistently and she thinks that is the cause of her chest discomfort and that she is not taking her omeprazole.  She is just restarted this and she is not having any new cardiovascular symptoms.  She is not exercising as much as I would like.  Is not having any new resting shortness.  She had no palpitations, presyncope or syncope. ? ? ? ? ?Past Medical History:  ?Diagnosis Date  ? Anxiety   ? Bulging of intervertebral disc between L4 and L5   ? CKD (chronic kidney disease), stage III ([place])   ? Due to Diabetes  ? Diabetes mellitus without complication ([place])   ? Family history of adverse reaction to anesthesia   ? mother post op nausea and vomiting  ? Fatty liver   ? GERD (gastroesophageal reflux disease)   ? Gout   ? Herpes   ? Cold sores  ? History of appendicitis 2014  ? History of colon polyps   ? History of migraine   ? Hyperlipidemia   ? Hypertension   ? Hypothyroidism   ? IBS (irritable bowel syndrome)   ? Iron deficiency anemia   ? Numbness and tingling of both lower extremities   ? Right is greather than left  ? Obesity   ? Peripheral neuropathy   ? Postmenopausal bleeding   ? Preseptal cellulitis of left eye 11/06/2019  ? Skin cancer   ? left  arm  ? Sleep apnea   ? Uterine polyp   ? ? ?Past Surgical History:  ?Procedure Laterality Date  ? CESAREAN SECTION    ? x2  ? CHOLECYSTECTOMY    ? COLONOSCOPY  11/23/2018  ? DILATATION & CURETTAGE/HYSTEROSCOPY WITH MYOSURE N/A 11/25/2019  ? Procedure: [place];  Surgeon: Brien Few, MD;  Location: [facility];  Service: Gynecology;  Laterality: N/A;  ? ESOPHAGEAL DILATION    ? Stomach dilation after gastric surgery  ? GASTRIC RESTRICTION SURGERY    ? x3  ? LAPAROSCOPIC APPENDECTOMY N/A 10/25/2013  ? Procedure: APPENDECTOMY LAPAROSCOPIC;  Surgeon: Jamesetta So, MD;  Location: [facility];  Service: General;  Laterality: N/A;  ? SKIN CANCER EXCISION Left   ? Arm  ? TONSILLECTOMY    ? TUBAL LIGATION    ? UPPER GI ENDOSCOPY    ? ? ? ?Current Outpatient Medications  ?Medication Sig Dispense Refill  ? allopurinol (ZYLOPRIM) 300 MG tablet TAKE 1 TABLET BY MOUTH  DAILY 90 tablet 0  ? diclofenac (VOLTAREN) 75 MG EC tablet Take 1 tablet (75 mg total) by mouth 2 (two)  times daily. 60 tablet 3  ? Empagliflozin-metFORMIN HCl (SYNJARDY) 12.03-999 MG TABS Take 1 tablet by mouth 2 (two) times daily. Most of the time its once daily 180 tablet 1  ? fenofibrate 160 MG tablet Take 1 tablet (160 mg total) by mouth daily. 90 tablet 0  ? glucose blood test strip Reli-on glucometer.  Test Blood sugar qid Dx. 250.01    ? Lancets (ONETOUCH ULTRASOFT) lancets Test 1X per day and prn   Dx 250.01 100 each 12  ? levothyroxine (SYNTHROID) 75 MCG tablet TAKE 1 TABLET BY MOUTH  DAILY BEFORE BREAKFAST 90 tablet 1  ? metoprolol succinate (TOPROL-XL) 25 MG 24 hr tablet Take 1 tablet (25 mg total) by mouth daily. 90 tablet 1  ? Omega-3 Fatty Acids (OMEGA 3 PO) Take 4 capsules by mouth daily.    ? omeprazole (PRILOSEC) 40 MG capsule Take 1 capsule (40 mg total) by mouth daily. 90 capsule 1  ? Semaglutide, 1 MG/DOSE, (OZEMPIC, 1 MG/DOSE,) 4 MG/3ML SOPN Inject 1 mg into the skin once a week.    ? ?No  current facility-administered medications for this visit.  ? ? ?Allergies:   Statins  ? ? ?ROS:  Please see the history of present illness.   Otherwise, review of systems are positive for none.   All other systems are reviewed and negative.  ? ? ?PHYSICAL EXAM: ?VS:  BP 119/72   Pulse 87   Ht '5\' 3"'$  (1.6 m)   Wt 240 lb 3.2 oz (109 kg)   LMP 06/29/2017   SpO2 98%   BMI 42.55 kg/m?  , BMI Body mass index is 42.55 kg/m?. ?GENERAL:  Well appearing ?NECK:  No jugular venous distention, waveform within normal limits, carotid upstroke brisk and symmetric, no bruits, no thyromegaly ?LUNGS:  Clear to auscultation bilaterally ?CHEST:  Unremarkable ?HEART:  PMI not displaced or sustained,S1 and S2 within normal limits, no S3, no S4, no clicks, no rubs, no murmurs ?ABD:  Flat, positive bowel sounds normal in frequency in pitch, no bruits, no rebound, no guarding, no midline pulsatile mass, no hepatomegaly, no splenomegaly ?EXT:  2 plus pulses throughout, no edema, no cyanosis no clubbing ? ?EKG:  EKG is not ordered today. ? ? ?Recent Labs: ?01/17/2022: ALT 18; BUN 21; Creatinine, Ser 1.06; Hemoglobin 14.1; Platelets 285; Potassium 4.4; Sodium 139; TSH 2.140  ? ? ?Lipid Panel ?   ?Component Value Date/Time  ? CHOL 189 10/04/2021 1131  ? CHOL 183 02/01/2013 1244  ? TRIG 232 (H) 10/04/2021 1131  ? TRIG 401 (H) 11/30/2016 7371  ? TRIG 217 (H) 02/01/2013 1244  ? HDL 43 10/04/2021 1131  ? HDL 39 (L) 11/30/2016 0626  ? HDL 43 02/01/2013 1244  ? CHOLHDL 4.4 10/04/2021 1131  ? LDLCALC 106 (H) 10/04/2021 1131  ? LDLCALC 107 (H) 05/02/2014 1640  ? [place] 97 02/01/2013 1244  ? ?  ? ?Wt Readings from Last 3 Encounters:  ?01/25/22 240 lb 3.2 oz (109 kg)  ?01/17/22 241 lb 12.8 oz (109.7 kg)  ?10/23/21 233 lb 12.8 oz (106.1 kg)  ?  ? ? ?Other studies Reviewed: ?Additional studies/ records that were reviewed today include: POET (Plain Old Exercise Treadmill).  ?Review of the above records demonstrates:  Please see elsewhere in the note.   ? ? ?ASSESSMENT AND PLAN: ? ?Precordial chest pain:   The patient's chest discomfort is unchanged and sporadic and she thinks it is related to GI.  There were no high risk findings on  her perfusion study.  No further testing is indicated but she will let me know if this gets worse rather than better at which point she might need a CT.   ? ?DM:  Last A1c  was 7.0 and she is now more consistently taking her medicine ? ?HTN:  BP is at target.  No change in therapy.  ? ?Dyslipidemia:  LDL was 106 with an HDL 43.  Her triglycerides were elevated and she has not been taking her omega-3's.  We talked about this and healthy fish to eat.  ? ?Current medicines are reviewed at length with the patient today.  The patient does not have concerns regarding medicines. ? ?The following changes have been made:  None ? ?Labs/ tests ordered today include: None ? ?No orders of the defined types were placed in this encounter. ? ? ? ?Disposition:   FU with me as needed.  ? ?Signed ? Minus Breeding  1:56 PM  01/25/2022 ? ? ?

## 2022-01-25 ENCOUNTER — Ambulatory Visit (INDEPENDENT_AMBULATORY_CARE_PROVIDER_SITE_OTHER): Payer: BC Managed Care – PPO | Admitting: Cardiology

## 2022-01-25 ENCOUNTER — Encounter: Payer: Self-pay | Admitting: Cardiology

## 2022-01-25 ENCOUNTER — Other Ambulatory Visit: Payer: Self-pay

## 2022-01-25 VITALS — BP 119/72 | HR 87 | Ht 63.0 in | Wt 240.2 lb

## 2022-01-25 DIAGNOSIS — R072 Precordial pain: Secondary | ICD-10-CM

## 2022-01-25 DIAGNOSIS — E118 Type 2 diabetes mellitus with unspecified complications: Secondary | ICD-10-CM

## 2022-01-25 DIAGNOSIS — E785 Hyperlipidemia, unspecified: Secondary | ICD-10-CM

## 2022-01-25 DIAGNOSIS — I10 Essential (primary) hypertension: Secondary | ICD-10-CM | POA: Diagnosis not present

## 2022-01-25 NOTE — Patient Instructions (Signed)
Medication Instructions:  ?Your physician recommends that you continue on your current medications as directed. Please refer to the Current Medication list given to you today.  ? ?Labwork: ?NONE ? ?Testing/Procedures: ?NONE ? ?Follow-Up: ?AS NEEDED  ? ?  ?

## 2022-02-25 DIAGNOSIS — N760 Acute vaginitis: Secondary | ICD-10-CM | POA: Diagnosis not present

## 2022-02-27 NOTE — Telephone Encounter (Signed)
Will defer that to christy's return since it's been >1 month since labs collected.  Recommend repeat iron level if she has been taking oral iron this whole time. ?

## 2022-03-12 ENCOUNTER — Ambulatory Visit: Payer: BC Managed Care – PPO | Admitting: Family

## 2022-03-26 ENCOUNTER — Encounter: Payer: Self-pay | Admitting: Family

## 2022-03-26 ENCOUNTER — Ambulatory Visit: Payer: BC Managed Care – PPO | Admitting: Family

## 2022-03-26 VITALS — BP 143/92 | HR 83 | Ht 63.0 in | Wt 240.0 lb

## 2022-03-26 DIAGNOSIS — E1159 Type 2 diabetes mellitus with other circulatory complications: Secondary | ICD-10-CM

## 2022-03-26 DIAGNOSIS — R112 Nausea with vomiting, unspecified: Secondary | ICD-10-CM

## 2022-03-26 DIAGNOSIS — R195 Other fecal abnormalities: Secondary | ICD-10-CM

## 2022-03-26 DIAGNOSIS — E611 Iron deficiency: Secondary | ICD-10-CM

## 2022-03-26 DIAGNOSIS — I152 Hypertension secondary to endocrine disorders: Secondary | ICD-10-CM | POA: Diagnosis not present

## 2022-03-26 MED ORDER — IRON (FERROUS SULFATE) 325 (65 FE) MG PO TABS
325.0000 mg | ORAL_TABLET | Freq: Every day | ORAL | 1 refills | Status: DC
Start: 2022-03-26 — End: 2022-12-30

## 2022-03-26 MED ORDER — ONDANSETRON HCL 4 MG PO TABS: 4.0000 mg | ORAL_TABLET | Freq: Three times a day (TID) | ORAL | 2 refills | Status: DC | PRN

## 2022-03-26 NOTE — Patient Instructions (Signed)
Iron Deficiency Anemia, Adult Iron deficiency anemia is a condition in which the concentration of red blood cells or hemoglobin in the blood is below normal because of too little iron. Hemoglobin is a substance in red blood cells that carries oxygen to the body's tissues. When the concentration of red blood cells or hemoglobin is too low, not enough oxygen reaches these tissues. Iron deficiency anemia is usually long-lasting, and it develops over time. It may or may not cause symptoms. It is a common type of anemia. What are the causes? This condition may be caused by: Not enough iron in the diet. Abnormal absorption in the gut. Increased need for iron because of pregnancy or heavy menstrual periods, for females. Cancers of the gastrointestinal system, such as colon cancer. Blood loss caused by bleeding in the intestine. This may be from a gastrointestinal condition like Crohn's disease. Frequent blood draws, such as from blood donation. What increases the risk? The following factors may make you more likely to develop this condition: Being pregnant. Being a teenage girl going through a growth spurt. What are the signs or symptoms? Symptoms of this condition may include: Pale skin, lips, and nail beds. Weakness, dizziness, and getting tired easily. Headache. Shortness of breath when moving or exercising. Cold hands and feet. Fast or irregular heartbeat. Irritability or rapid breathing. These are more common in severe anemia. Mild anemia may not cause any symptoms. How is this diagnosed? This condition is diagnosed based on: Your medical history. A physical exam. Blood tests. You may have additional tests to find the underlying cause of your anemia, such as: Testing for blood in the stool (fecal occult blood test). A procedure to see inside your colon and rectum (colonoscopy). A procedure to see inside your esophagus and stomach (endoscopy). A test in which cells are removed from  bone marrow (bone marrow aspiration) or fluid is removed from the bone marrow to be examined. This is rarely needed. How is this treated? This condition is treated by correcting the cause of your iron deficiency. Treatment may involve: Adding iron-rich foods to your diet. Taking iron supplements. If you are pregnant or breastfeeding, you may need to take extra iron because your normal diet usually does not provide the amount of iron that you need. Increasing vitamin C intake. Vitamin C helps your body absorb iron. Your health care provider may recommend that you take iron supplements along with a glass of orange juice or a vitamin C supplement. Medicines to make heavy menstrual flow lighter. Surgery. You may need repeat blood tests to determine whether treatment is working. If the treatment does not seem to be working, you may need more tests. Follow these instructions at home: Medicines Take over-the-counter and prescription medicines only as told by your health care provider. This includes iron supplements and vitamins. For the best iron absorption, you should take iron supplements when your stomach is empty. If you cannot tolerate them on an empty stomach, you may need to take them with food. Do not drink milk or take antacids at the same time as your iron supplements. Milk and antacids may interfere with iron absorption. Iron supplements may turn stool (feces) a darker color and it may appear black. If you cannot tolerate taking iron supplements by mouth, talk with your health care provider about taking them through an IV or through an injection into a muscle. Eating and drinking  Talk with your health care provider before changing your diet. He or she may recommend   that you eat foods that contain a lot of iron, such as: Liver. Low-fat (lean) beef. Breads and cereals that have iron added to them (are fortified). Eggs. Dried fruit. Dark green, leafy vegetables. To help your body use the  iron from iron-rich foods, eat those foods at the same time as fresh fruits and vegetables that are high in vitamin C. Foods that are high in vitamin C include: Oranges. Peppers. Tomatoes. Mangoes. Drink enough fluid to keep your urine pale yellow. Managing constipation If you are taking an iron supplement, it may cause constipation. To prevent or treat constipation, you may need to: Take over-the-counter or prescription medicines. Eat foods that are high in fiber, such as beans, whole grains, and fresh fruits and vegetables. Limit foods that are high in fat and processed sugars, such as fried or sweet foods. General instructions Return to your normal activities as told by your health care provider. Ask your health care provider what activities are safe for you. Practice good hygiene. Anemia can make you more prone to illness and infection. Keep all follow-up visits as told by your health care provider. This is important. Contact a health care provider if you: Feel nauseous or you vomit. Feel weak. Have unexplained sweating. Develop symptoms of constipation, such as: Having fewer than three bowel movements a week. Straining to have a bowel movement. Having stools that are hard, dry, or larger than normal. Feeling full or bloated. Pain in the lower abdomen. Not feeling relief after having a bowel movement. Get help right away if you: Faint. If this happens, do not drive yourself to the hospital. Have chest pain. Have shortness of breath that: Is severe. Gets worse with physical activity. Have an irregular or rapid heartbeat. Become light-headed when getting up from a sitting or lying down position. These symptoms may represent a serious problem that is an emergency. Do not wait to see if the symptoms will go away. Get medical help right away. Call your local emergency services (911 in the [place] Do not drive yourself to the hospital. Summary Iron deficiency anemia is a condition in  which the concentration of red blood cells or hemoglobin in the blood is below normal because of too little iron. This condition is treated by correcting the cause of your iron deficiency. Take over-the-counter and prescription medicines only as told by your health care provider. This includes iron supplements and vitamins. To help your body use the iron from iron-rich foods, eat those foods at the same time as fresh fruits and vegetables that are high in vitamin C. Get help right away if you have shortness of breath that gets worse with physical activity. This information is not intended to replace advice given to you by your health care provider. Make sure you discuss any questions you have with your health care provider. Document Revised: 07/13/2019 Document Reviewed: 07/13/2019 Elsevier Patient Education  2022 [employer]  

## 2022-03-26 NOTE — Progress Notes (Signed)
? ?Subjective:  ? ? Patient ID: Holly Rodriguez, female    DOB: [birth year], 56 y.o.   MRN: [id number] ? ?Chief Complaint  ?Patient presents with  ? Medical Management of Chronic Issues  ? Fatigue  ? ?PT presents to the office today with increasing fatigue. She was seen on 01/17/22 and told to start iron. She reports she has not been to get these.  ? ?Her BP is elevated today. She has not take her medications today.  ? ?She had dark stools early in the week. This made her nervous.  ? ?She has intermittent Nausea and vomiting for the last few days, ?Hypertension ?This is a chronic problem. The current episode started more than 1 year ago. The problem has been waxing and waning since onset. The problem is uncontrolled. Associated symptoms include malaise/fatigue. Pertinent negatives include no peripheral edema or shortness of breath. Risk factors for coronary artery disease include obesity. The current treatment provides moderate improvement.  ? ? ? ?Review of Systems  ?Constitutional:  Positive for fatigue and malaise/fatigue.  ?Respiratory:  Negative for shortness of breath.   ?Gastrointestinal:  Positive for nausea and vomiting.  ?All other systems reviewed and are negative. ? ?   ?Objective:  ? Physical Exam ?Vitals reviewed.  ?Constitutional:   ?   General: She is not in acute distress. ?   Appearance: She is well-developed. She is obese.  ?HENT:  ?   Head: Normocephalic and atraumatic.  ?   Right Ear: Tympanic membrane normal.  ?   Left Ear: Tympanic membrane normal.  ?Eyes:  ?   Pupils: Pupils are equal, round, and reactive to light.  ?Neck:  ?   Thyroid: No thyromegaly.  ?Cardiovascular:  ?   Rate and Rhythm: Normal rate and regular rhythm.  ?   Heart sounds: Normal heart sounds. No murmur heard. ?Pulmonary:  ?   Effort: Pulmonary effort is normal. No respiratory distress.  ?   Breath sounds: Normal breath sounds. No wheezing.  ?Abdominal:  ?   General: Bowel sounds are normal. There is no distension.  ?    Palpations: Abdomen is soft.  ?   Tenderness: There is no abdominal tenderness.  ?Musculoskeletal:     ?   General: No tenderness. Normal range of motion.  ?   Cervical back: Normal range of motion and neck supple.  ?Skin: ?   General: Skin is warm and dry.  ?Neurological:  ?   Mental Status: She is alert and oriented to person, place, and time.  ?   Cranial Nerves: No cranial nerve deficit.  ?   Deep Tendon Reflexes: Reflexes are normal and symmetric.  ?Psychiatric:     ?   Behavior: Behavior normal.     ?   Thought Content: Thought content normal.     ?   Judgment: Judgment normal.  ? ? ? ? ? ?  BP (!) 159/97   Pulse 83   Ht '5\' 3"'$  (1.6 m)   Wt 240 lb (108.9 kg)   LMP 06/29/2017   SpO2 97%   BMI 42.51 kg/m?  ? ?Assessment & Plan:  ?Holly Rodriguez comes in today with chief complaint of Medical Management of Chronic Issues and Fatigue ? ? ?Diagnosis and orders addressed: ? ?1. Hypertension associated with diabetes ([place]) ?PT will take medications once she gets home ?Stress the importance of taking daily ?- CBC with Differential/Platelet ? ?2. Iron deficiency ?Start iron ?Iron rich diet ?- Iron, Ferrous Sulfate,  325 (65 Fe) MG TABS; Take 325 mg by mouth daily.  Dispense: 90 tablet; Refill: 1 ?- CBC with Differential/Platelet ? ?3. Dark stools ?- CBC with Differential/Platelet ? ?4. Nausea and vomiting, unspecified vomiting type ?Zofran as needed  ?Bland diet ?- CBC with Differential/Platelet ?- ondansetron (ZOFRAN) 4 MG tablet; Take 1 tablet (4 mg total) by mouth every 8 (eight) hours as needed for nausea or vomiting.  Dispense: 20 tablet; Refill: 2 ? ? ?Labs pending ?Health Maintenance reviewed ?Diet and exercise encouraged ? ?Follow up plan: ?3 months  ? ? ?Evelina Dun, FNP ? ? ? ?

## 2022-03-27 LAB — CBC WITH DIFFERENTIAL/PLATELET
Basophils Absolute: 0.1 10*3/uL (ref 0.0–0.2)
Basos: 1 %
EOS (ABSOLUTE): 0.5 10*3/uL — ABNORMAL HIGH (ref 0.0–0.4)
Eos: 5 %
Hematocrit: 40.8 % (ref 34.0–46.6)
Hemoglobin: 14 g/dL (ref 11.1–15.9)
Immature Grans (Abs): 0.1 10*3/uL (ref 0.0–0.1)
Immature Granulocytes: 1 %
Lymphocytes Absolute: 3.9 10*3/uL — ABNORMAL HIGH (ref 0.7–3.1)
Lymphs: 39 %
MCH: 29.5 pg (ref 26.6–33.0)
MCHC: 34.3 g/dL (ref 31.5–35.7)
MCV: 86 fL (ref 79–97)
Monocytes Absolute: 0.6 10*3/uL (ref 0.1–0.9)
Monocytes: 6 %
Neutrophils Absolute: 4.9 10*3/uL (ref 1.4–7.0)
Neutrophils: 48 %
Platelets: 299 10*3/uL (ref 150–450)
RBC: 4.75 x10E6/uL (ref 3.77–5.28)
RDW: 14.7 % (ref 11.7–15.4)
WBC: 10 10*3/uL (ref 3.4–10.8)

## 2022-04-16 ENCOUNTER — Other Ambulatory Visit: Payer: Self-pay | Admitting: Family

## 2022-04-16 DIAGNOSIS — E1169 Type 2 diabetes mellitus with other specified complication: Secondary | ICD-10-CM

## 2022-04-16 DIAGNOSIS — M109 Gout, unspecified: Secondary | ICD-10-CM

## 2022-04-16 DIAGNOSIS — M2550 Pain in unspecified joint: Secondary | ICD-10-CM

## 2022-04-18 DIAGNOSIS — Z01419 Encounter for gynecological examination (general) (routine) without abnormal findings: Secondary | ICD-10-CM | POA: Diagnosis not present

## 2022-04-18 DIAGNOSIS — Z1231 Encounter for screening mammogram for malignant neoplasm of breast: Secondary | ICD-10-CM | POA: Diagnosis not present

## 2022-04-18 DIAGNOSIS — Z6841 Body Mass Index (BMI) 40.0 and over, adult: Secondary | ICD-10-CM | POA: Diagnosis not present

## 2022-04-18 DIAGNOSIS — Z0142 Encounter for cervical smear to confirm findings of recent normal smear following initial abnormal smear: Secondary | ICD-10-CM | POA: Diagnosis not present

## 2022-04-18 LAB — HM PAP SMEAR: HPV DNA High Risk: NEGATIVE

## 2022-05-04 ENCOUNTER — Other Ambulatory Visit: Payer: Self-pay | Admitting: Family

## 2022-05-04 DIAGNOSIS — E1169 Type 2 diabetes mellitus with other specified complication: Secondary | ICD-10-CM

## 2022-05-04 DIAGNOSIS — M109 Gout, unspecified: Secondary | ICD-10-CM

## 2022-05-19 ENCOUNTER — Other Ambulatory Visit: Payer: Self-pay | Admitting: Family

## 2022-05-19 DIAGNOSIS — M2550 Pain in unspecified joint: Secondary | ICD-10-CM

## 2022-06-11 ENCOUNTER — Other Ambulatory Visit: Payer: Self-pay | Admitting: Family

## 2022-06-11 DIAGNOSIS — E079 Disorder of thyroid, unspecified: Secondary | ICD-10-CM

## 2022-06-11 DIAGNOSIS — E1165 Type 2 diabetes mellitus with hyperglycemia: Secondary | ICD-10-CM

## 2022-06-11 MED ORDER — LEVOTHYROXINE SODIUM 75 MCG PO TABS: ORAL_TABLET | ORAL | 0 refills | Status: DC

## 2022-06-11 MED ORDER — METOPROLOL SUCCINATE ER 25 MG PO TB24: 25.0000 mg | ORAL_TABLET | Freq: Every day | ORAL | 0 refills | Status: DC

## 2022-06-11 MED ORDER — SYNJARDY 12.5-1000 MG PO TABS: 1.0000 | ORAL_TABLET | Freq: Two times a day (BID) | ORAL | 0 refills | Status: DC

## 2022-06-11 MED ORDER — OMEPRAZOLE 40 MG PO CPDR: 40.0000 mg | DELAYED_RELEASE_CAPSULE | Freq: Every day | ORAL | 0 refills | Status: DC

## 2022-06-24 DIAGNOSIS — H5203 Hypermetropia, bilateral: Secondary | ICD-10-CM | POA: Diagnosis not present

## 2022-06-24 DIAGNOSIS — H2513 Age-related nuclear cataract, bilateral: Secondary | ICD-10-CM | POA: Diagnosis not present

## 2022-06-24 DIAGNOSIS — H04123 Dry eye syndrome of bilateral lacrimal glands: Secondary | ICD-10-CM | POA: Diagnosis not present

## 2022-06-24 DIAGNOSIS — H0102A Squamous blepharitis right eye, upper and lower eyelids: Secondary | ICD-10-CM | POA: Diagnosis not present

## 2022-06-24 DIAGNOSIS — E119 Type 2 diabetes mellitus without complications: Secondary | ICD-10-CM | POA: Diagnosis not present

## 2022-06-24 LAB — HM DIABETES EYE EXAM

## 2022-06-27 ENCOUNTER — Encounter: Payer: Self-pay | Admitting: Family

## 2022-06-27 ENCOUNTER — Ambulatory Visit: Payer: BC Managed Care – PPO | Admitting: Family

## 2022-06-27 VITALS — BP 131/87 | HR 68 | Temp 97.4°F | Ht 63.0 in | Wt 235.4 lb

## 2022-06-27 DIAGNOSIS — E079 Disorder of thyroid, unspecified: Secondary | ICD-10-CM

## 2022-06-27 DIAGNOSIS — E1122 Type 2 diabetes mellitus with diabetic chronic kidney disease: Secondary | ICD-10-CM | POA: Diagnosis not present

## 2022-06-27 DIAGNOSIS — E1169 Type 2 diabetes mellitus with other specified complication: Secondary | ICD-10-CM | POA: Diagnosis not present

## 2022-06-27 DIAGNOSIS — G4733 Obstructive sleep apnea (adult) (pediatric): Secondary | ICD-10-CM | POA: Diagnosis not present

## 2022-06-27 DIAGNOSIS — N1832 Chronic kidney disease, stage 3b: Secondary | ICD-10-CM

## 2022-06-27 DIAGNOSIS — E611 Iron deficiency: Secondary | ICD-10-CM

## 2022-06-27 DIAGNOSIS — M791 Myalgia, unspecified site: Secondary | ICD-10-CM

## 2022-06-27 DIAGNOSIS — N183 Chronic kidney disease, stage 3 unspecified: Secondary | ICD-10-CM

## 2022-06-27 DIAGNOSIS — F411 Generalized anxiety disorder: Secondary | ICD-10-CM

## 2022-06-27 DIAGNOSIS — I152 Hypertension secondary to endocrine disorders: Secondary | ICD-10-CM | POA: Diagnosis not present

## 2022-06-27 DIAGNOSIS — E559 Vitamin D deficiency, unspecified: Secondary | ICD-10-CM

## 2022-06-27 DIAGNOSIS — E1159 Type 2 diabetes mellitus with other circulatory complications: Secondary | ICD-10-CM

## 2022-06-27 DIAGNOSIS — E781 Pure hyperglyceridemia: Secondary | ICD-10-CM

## 2022-06-27 DIAGNOSIS — T466X5A Adverse effect of antihyperlipidemic and antiarteriosclerotic drugs, initial encounter: Secondary | ICD-10-CM

## 2022-06-27 DIAGNOSIS — B372 Candidiasis of skin and nail: Secondary | ICD-10-CM

## 2022-06-27 DIAGNOSIS — E785 Hyperlipidemia, unspecified: Secondary | ICD-10-CM

## 2022-06-27 LAB — BAYER DCA HB A1C WAIVED: HB A1C (BAYER DCA - WAIVED): 6.2 % — ABNORMAL HIGH (ref 4.8–5.6)

## 2022-06-27 MED ORDER — FLUCONAZOLE 150 MG PO TABS: 150.0000 mg | ORAL_TABLET | ORAL | 0 refills | Status: DC | PRN

## 2022-06-27 MED ORDER — NYSTATIN 100000 UNIT/GM EX POWD: 1.0000 | Freq: Three times a day (TID) | CUTANEOUS | 0 refills | Status: DC

## 2022-06-27 NOTE — Progress Notes (Signed)
Subjective:    Patient ID: Holly Rodriguez, female    DOB: Aug 24, 1966, 56 y.o.   MRN: [id number]  Chief Complaint  Patient presents with   Medical Management of Chronic Issues    Discuss ozempic with hurting, spots in head. Redness near c section line    Pt presents to the office today for chronic follow up. She is followed by GYN annually.  She is followed by Endocrinologists for DM. She has CKD and limits NSAID's. Has seen Nephrologists, but was released.    She is morbid obese with BMI of 41. She has OSA and uses CPAP nightly. This is stable. Hypertension This is a chronic problem. The current episode started more than 1 year ago. The problem has been resolved since onset. The problem is controlled. Associated symptoms include anxiety and malaise/fatigue. Pertinent negatives include no blurred vision, peripheral edema or shortness of breath. Risk factors for coronary artery disease include dyslipidemia, diabetes mellitus, obesity and sedentary lifestyle. The current treatment provides moderate improvement. Identifiable causes of hypertension include a thyroid problem.  Diabetes She presents for her follow-up diabetic visit. She has type 2 diabetes mellitus. Hypoglycemia symptoms include nervousness/anxiousness. Associated symptoms include fatigue and foot paresthesias. Pertinent negatives for diabetes include no blurred vision. Symptoms are stable. Diabetic complications include peripheral neuropathy. Pertinent negatives for diabetic complications include no heart disease. Risk factors for coronary artery disease include dyslipidemia, diabetes mellitus, hypertension and sedentary lifestyle. She is following a generally healthy diet. (Does not check at home, last A1C 6.7)  Thyroid Problem Presents for follow-up visit. Symptoms include anxiety, dry skin and fatigue. Patient reports no cold intolerance, constipation, diarrhea, hair loss or hoarse voice. The symptoms have been stable. Her past  medical history is significant for hyperlipidemia.  Hyperlipidemia This is a chronic problem. The current episode started more than 1 year ago. The problem is uncontrolled. Exacerbating diseases include obesity. Pertinent negatives include no shortness of breath. Current antihyperlipidemic treatment includes fibric acid derivatives. The current treatment provides mild improvement of lipids. Risk factors for coronary artery disease include dyslipidemia, diabetes mellitus, hypertension and a sedentary lifestyle.  Anemia Presents for follow-up visit. Symptoms include malaise/fatigue.  Anxiety Presents for follow-up visit. Symptoms include excessive worry, irritability and nervous/anxious behavior. Patient reports no shortness of breath. Symptoms occur occasionally. The severity of symptoms is moderate.   Her past medical history is significant for anemia.  Rash This is a recurrent problem. The current episode started more than 1 month ago. The problem has been waxing and waning since onset. Location: under bilateral breast and abdomen. Associated symptoms include fatigue. Pertinent negatives include no diarrhea or shortness of breath. Past treatments include anti-itch cream. The treatment provided mild relief.      Review of Systems  Constitutional:  Positive for fatigue, irritability and malaise/fatigue.  HENT:  Negative for hoarse voice.   Eyes:  Negative for blurred vision.  Respiratory:  Negative for shortness of breath.   Gastrointestinal:  Negative for constipation and diarrhea.  Endocrine: Negative for cold intolerance.  Skin:  Positive for rash.  Psychiatric/Behavioral:  The patient is nervous/anxious.   All other systems reviewed and are negative.      Objective:   Physical Exam Vitals reviewed.  Constitutional:      General: She is not in acute distress.    Appearance: She is well-developed. She is obese.  HENT:     Head: Normocephalic and atraumatic.     Right Ear:  Tympanic membrane normal.  Left Ear: Tympanic membrane normal.  Eyes:     Pupils: Pupils are equal, round, and reactive to light.  Neck:     Thyroid: No thyromegaly.  Cardiovascular:     Rate and Rhythm: Normal rate and regular rhythm.     Heart sounds: Normal heart sounds. No murmur heard. Pulmonary:     Effort: Pulmonary effort is normal. No respiratory distress.     Breath sounds: Normal breath sounds. No wheezing.  Abdominal:     General: Bowel sounds are normal. There is no distension.     Palpations: Abdomen is soft.     Tenderness: There is no abdominal tenderness.  Musculoskeletal:        General: No tenderness. Normal range of motion.     Cervical back: Normal range of motion and neck supple.  Skin:    General: Skin is warm and dry.     Findings: Erythema and rash present.          Comments: Erythemas yeast rash  Neurological:     Mental Status: She is alert and oriented to person, place, and time.     Cranial Nerves: No cranial nerve deficit.     Deep Tendon Reflexes: Reflexes are normal and symmetric.  Psychiatric:        Behavior: Behavior normal.        Thought Content: Thought content normal.        Judgment: Judgment normal.       BP 131/87   Pulse 68   Temp (!) 97.4 F (36.3 C) (Temporal)   Ht 5' 3"  (1.6 m)   Wt 235 lb 6.4 oz (106.8 kg)   LMP 06/29/2017   BMI 41.70 kg/m      Assessment & Plan:  Holly Rodriguez comes in today with chief complaint of Medical Management of Chronic Issues (Discuss ozempic with hurting, spots in head. Redness near c section line )   Diagnosis and orders addressed:  1. Hypertension associated with diabetes ([place]) - CMP14+EGFR - CBC with Differential/Platelet  2. OSA (obstructive sleep apnea) - CMP14+EGFR - CBC with Differential/Platelet  3. CKD stage 3 due to type 2 diabetes mellitus (HCC) - CMP14+EGFR - CBC with Differential/Platelet  4. Hyperlipidemia associated with type 2 diabetes mellitus (HCC) -  CMP14+EGFR - CBC with Differential/Platelet  5. Thyroid disease - CMP14+EGFR - CBC with Differential/Platelet  6. Type 2 diabetes mellitus with other specified complication, without long-term current use of insulin (HCC) - Bayer DCA Hb A1c Waived - CMP14+EGFR - CBC with Differential/Platelet - Microalbumin / creatinine urine ratio  7. Stage 3b chronic kidney disease ([place]) - CMP14+EGFR - CBC with Differential/Platelet  8. GAD (generalized anxiety disorder) - CMP14+EGFR - CBC with Differential/Platelet  9. Dyslipidemia - CMP14+EGFR - CBC with Differential/Platelet  10. Hypertriglyceridemia - CMP14+EGFR - CBC with Differential/Platelet  11. Iron deficiency - CMP14+EGFR - CBC with Differential/Platelet  12. Morbid obesity ([place]) - CMP14+EGFR - CBC with Differential/Platelet  13. Vitamin D deficiency - CMP14+EGFR - CBC with Differential/Platelet  14. Myalgia due to statin - CMP14+EGFR - CBC with Differential/Platelet  15. Candidal skin infection Keep clean and dry Diflucan and nystatin powder Keep clean and dry - fluconazole (DIFLUCAN) 150 MG tablet; Take 1 tablet (150 mg total) by mouth every three (3) days as needed.  Dispense: 3 tablet; Refill: 0 - nystatin (MYCOSTATIN/NYSTOP) powder; Apply 1 Application topically 3 (three) times daily.  Dispense: 15 g; Refill: 0   Labs pending Health Maintenance reviewed Diet and  exercise encouraged  Follow up plan: 4 month    Evelina Dun, FNP

## 2022-06-27 NOTE — Patient Instructions (Addendum)
Skin Yeast Infection  A skin yeast infection is a condition in which there is an overgrowth of yeast (Candida) that normally lives on the skin. This condition usually occurs in areas of the skin that are constantly warm and moist, such as the skin under the breasts or armpits, or in the groin and other body folds. What are the causes? This condition is caused by a change in the normal balance of the yeast that live on the skin. What increases the risk? You are more likely to develop this condition if you: Are obese. Are pregnant. Are 65 years of age or older. Wear tight clothing. Have any of the following conditions: Diabetes. Malnutrition. A weak body defense system (immune system). Take medicines such as: Birth control pills. Antibiotics. Steroid medicines. What are the signs or symptoms? The most common symptom of this condition is itchiness in the affected area. Other symptoms include: A red, swollen area of the skin. Bumps on the skin. How is this diagnosed? This condition is diagnosed with a medical history and physical exam. Your health care provider may check for yeast by taking scrapings of the skin to be viewed under a microscope. How is this treated? This condition is treated with medicine. Medicines may be prescribed or available over the counter. The medicines may be: Taken by mouth (orally). Applied as a cream or powder to your skin. Follow these instructions at home:  Take or apply over-the-counter and prescription medicines only as told by your health care provider. Maintain a healthy weight. If you need help losing weight, talk with your health care provider. Keep your skin clean and dry. Wear loose-fitting clothing. If you have diabetes, keep your blood sugar under control. Keep all follow-up visits. This is important. Contact a health care provider if: Your symptoms go away and then come back. Your symptoms do not get better with treatment. Your symptoms get  worse. Your rash spreads. You have a fever or chills. You have new symptoms. You have new warmth or redness of your skin. Your rash is painful or bleeding. Summary A skin yeast infection is a condition in which there is an overgrowth of yeast (Candida) that normally lives on the skin. Take or apply over-the-counter and prescription medicines only as told by your health care provider. Keep your skin clean and dry. Contact a health care provider if your symptoms do not get better with treatment. This information is not intended to replace advice given to you by your health care provider. Make sure you discuss any questions you have with your health care provider. Document Revised: 01/23/2021 Document Reviewed: 01/23/2021 Elsevier Patient Education  2023 [employer]  

## 2022-06-28 DIAGNOSIS — B372 Candidiasis of skin and nail: Secondary | ICD-10-CM

## 2022-06-28 LAB — CBC WITH DIFFERENTIAL/PLATELET
Basophils Absolute: 0.1 10*3/uL (ref 0.0–0.2)
Basos: 1 %
EOS (ABSOLUTE): 0.2 10*3/uL (ref 0.0–0.4)
Eos: 2 %
Hematocrit: 40.7 % (ref 34.0–46.6)
Hemoglobin: 14 g/dL (ref 11.1–15.9)
Immature Grans (Abs): 0 10*3/uL (ref 0.0–0.1)
Immature Granulocytes: 0 %
Lymphocytes Absolute: 4.2 10*3/uL — ABNORMAL HIGH (ref 0.7–3.1)
Lymphs: 41 %
MCH: 29.8 pg (ref 26.6–33.0)
MCHC: 34.4 g/dL (ref 31.5–35.7)
MCV: 87 fL (ref 79–97)
Monocytes Absolute: 0.6 10*3/uL (ref 0.1–0.9)
Monocytes: 6 %
Neutrophils Absolute: 5 10*3/uL (ref 1.4–7.0)
Neutrophils: 50 %
Platelets: 306 10*3/uL (ref 150–450)
RBC: 4.7 x10E6/uL (ref 3.77–5.28)
RDW: 14.1 % (ref 11.7–15.4)
WBC: 10.2 10*3/uL (ref 3.4–10.8)

## 2022-06-28 LAB — CMP14+EGFR
ALT: 23 IU/L (ref 0–32)
AST: 25 IU/L (ref 0–40)
Albumin/Globulin Ratio: 1.6 (ref 1.2–2.2)
Albumin: 4.5 g/dL (ref 3.8–4.9)
Alkaline Phosphatase: 88 IU/L (ref 44–121)
BUN/Creatinine Ratio: 18 (ref 9–23)
BUN: 24 mg/dL (ref 6–24)
Bilirubin Total: <0.2 mg/dL (ref 0.0–1.2)
CO2: 22 mmol/L (ref 20–29)
Calcium: 9.4 mg/dL (ref 8.7–10.2)
Chloride: 102 mmol/L (ref 96–106)
Creatinine, Ser: 1.32 mg/dL — ABNORMAL HIGH (ref 0.57–1.00)
Globulin, Total: 2.8 g/dL (ref 1.5–4.5)
Glucose: 112 mg/dL — ABNORMAL HIGH (ref 70–99)
Potassium: 4.2 mmol/L (ref 3.5–5.2)
Sodium: 140 mmol/L (ref 134–144)
Total Protein: 7.3 g/dL (ref 6.0–8.5)
eGFR: 47 mL/min/{1.73_m2} — ABNORMAL LOW (ref >59)

## 2022-06-28 LAB — MICROALBUMIN / CREATININE URINE RATIO
Creatinine, Urine: 110.2 mg/dL
Microalb/Creat Ratio: 16 mg/g creat (ref 0–29)
Microalbumin, Urine: 17.4 ug/mL

## 2022-06-28 MED ORDER — FLUCONAZOLE 150 MG PO TABS: 150.0000 mg | ORAL_TABLET | ORAL | 0 refills | Status: DC | PRN

## 2022-07-24 ENCOUNTER — Encounter (INDEPENDENT_AMBULATORY_CARE_PROVIDER_SITE_OTHER): Payer: BC Managed Care – PPO

## 2022-07-24 DIAGNOSIS — M25551 Pain in right hip: Secondary | ICD-10-CM | POA: Diagnosis not present

## 2022-07-25 MED ORDER — PREDNISONE 10 MG (21) PO TBPK: ORAL_TABLET | ORAL | 0 refills | Status: DC

## 2022-07-25 NOTE — Telephone Encounter (Signed)
Prednisone Prescription sent to pharmacy. Follow up as needed.

## 2022-09-16 ENCOUNTER — Other Ambulatory Visit: Payer: Self-pay | Admitting: Family

## 2022-09-16 DIAGNOSIS — E1165 Type 2 diabetes mellitus with hyperglycemia: Secondary | ICD-10-CM

## 2022-09-16 DIAGNOSIS — E079 Disorder of thyroid, unspecified: Secondary | ICD-10-CM

## 2022-12-30 ENCOUNTER — Ambulatory Visit: Payer: BC Managed Care – PPO | Admitting: Family

## 2022-12-30 ENCOUNTER — Encounter: Payer: Self-pay | Admitting: Family

## 2022-12-30 VITALS — BP 148/97 | HR 71 | Temp 97.2°F | Ht 63.0 in | Wt 236.2 lb

## 2022-12-30 DIAGNOSIS — M791 Myalgia, unspecified site: Secondary | ICD-10-CM

## 2022-12-30 DIAGNOSIS — E1122 Type 2 diabetes mellitus with diabetic chronic kidney disease: Secondary | ICD-10-CM

## 2022-12-30 DIAGNOSIS — E611 Iron deficiency: Secondary | ICD-10-CM

## 2022-12-30 DIAGNOSIS — F411 Generalized anxiety disorder: Secondary | ICD-10-CM

## 2022-12-30 DIAGNOSIS — N1832 Chronic kidney disease, stage 3b: Secondary | ICD-10-CM

## 2022-12-30 DIAGNOSIS — E1165 Type 2 diabetes mellitus with hyperglycemia: Secondary | ICD-10-CM

## 2022-12-30 DIAGNOSIS — I152 Hypertension secondary to endocrine disorders: Secondary | ICD-10-CM

## 2022-12-30 DIAGNOSIS — R1031 Right lower quadrant pain: Secondary | ICD-10-CM

## 2022-12-30 DIAGNOSIS — N183 Chronic kidney disease, stage 3 unspecified: Secondary | ICD-10-CM

## 2022-12-30 DIAGNOSIS — E1169 Type 2 diabetes mellitus with other specified complication: Secondary | ICD-10-CM

## 2022-12-30 DIAGNOSIS — T466X5A Adverse effect of antihyperlipidemic and antiarteriosclerotic drugs, initial encounter: Secondary | ICD-10-CM

## 2022-12-30 DIAGNOSIS — E785 Hyperlipidemia, unspecified: Secondary | ICD-10-CM

## 2022-12-30 DIAGNOSIS — E559 Vitamin D deficiency, unspecified: Secondary | ICD-10-CM

## 2022-12-30 DIAGNOSIS — Z23 Encounter for immunization: Secondary | ICD-10-CM

## 2022-12-30 DIAGNOSIS — E1159 Type 2 diabetes mellitus with other circulatory complications: Secondary | ICD-10-CM

## 2022-12-30 DIAGNOSIS — E039 Hypothyroidism, unspecified: Secondary | ICD-10-CM

## 2022-12-30 DIAGNOSIS — G4733 Obstructive sleep apnea (adult) (pediatric): Secondary | ICD-10-CM

## 2022-12-30 LAB — BAYER DCA HB A1C WAIVED: HB A1C (BAYER DCA - WAIVED): 7.1 % — ABNORMAL HIGH (ref 4.8–5.6)

## 2022-12-30 MED ORDER — LISINOPRIL 20 MG PO TABS: 20.0000 mg | ORAL_TABLET | Freq: Every day | ORAL | 3 refills | Status: DC

## 2022-12-30 MED ORDER — LOSARTAN POTASSIUM 50 MG PO TABS: 50.0000 mg | ORAL_TABLET | Freq: Every day | ORAL | 1 refills | Status: DC

## 2022-12-30 NOTE — Progress Notes (Signed)
Subjective:    Patient ID: Holly Rodriguez, female    DOB: 1966/02/11, 57 y.o.   MRN: [id number]  Chief Complaint  Patient presents with   Medical Management of Chronic Issues   Headache    All last week and the weekend before.   Abdominal Pain    2-3 weeks ago. Has seen blood when having BM bright read blood. Stomach gets haed    Pt presents to the office today for chronic follow up. She is followed by GYN annually.  She is followed by Endocrinologists for DM. She has CKD and limits NSAID's. Has seen Nephrologists, but was released.   She reports she can not tolerate statins related to muscle aches.    She is morbid obese with BMI of 41. She has OSA and uses CPAP nightly. This is stable.  States she has not been taking her medications regularly in the past few months.   Complaining of intermittent right lower abdominal pain. States she quit having sex two years ago because after she would have extreme pain in RLQ.  Headache  Associated symptoms include abdominal pain. Pertinent negatives include no blurred vision. Her past medical history is significant for hypertension and obesity.  Abdominal Pain This is a new problem. The current episode started more than 1 month ago. The pain is located in the LUQ and RLQ. The pain is moderate. The quality of the pain is cramping. Associated symptoms include constipation, diarrhea and headaches. The treatment provided mild relief.  Hypertension This is a chronic problem. The current episode started more than 1 year ago. The problem has been waxing and waning since onset. The problem is uncontrolled. Associated symptoms include anxiety, headaches and malaise/fatigue. Pertinent negatives include no blurred vision, peripheral edema or shortness of breath. Risk factors for coronary artery disease include dyslipidemia, diabetes mellitus, obesity, smoking/tobacco exposure and sedentary lifestyle. The current treatment provides moderate improvement.   Diabetes She presents for her follow-up diabetic visit. She has type 2 diabetes mellitus. Hypoglycemia symptoms include headaches and nervousness/anxiousness. Associated symptoms include foot paresthesias. Pertinent negatives for diabetes include no blurred vision. Symptoms are stable. Diabetic complications include peripheral neuropathy. Risk factors for coronary artery disease include dyslipidemia, diabetes mellitus, hypertension, sedentary lifestyle and post-menopausal. Her overall blood glucose range is 140-180 mg/dl.  Hyperlipidemia This is a chronic problem. The current episode started more than 1 year ago. Exacerbating diseases include obesity. Pertinent negatives include no shortness of breath. Current antihyperlipidemic treatment includes diet change. The current treatment provides moderate improvement of lipids. Risk factors for coronary artery disease include dyslipidemia, diabetes mellitus, hypertension, a sedentary lifestyle and post-menopausal.  Anxiety Presents for follow-up visit. Symptoms include depressed mood, excessive worry, nervous/anxious behavior and restlessness. Patient reports no shortness of breath. Symptoms occur occasionally. The severity of symptoms is moderate.   Her past medical history is significant for anemia.  Anemia Presents for follow-up visit. Symptoms include abdominal pain and malaise/fatigue.      Review of Systems  Constitutional:  Positive for malaise/fatigue.  Eyes:  Negative for blurred vision.  Respiratory:  Negative for shortness of breath.   Gastrointestinal:  Positive for abdominal pain, constipation and diarrhea.  Neurological:  Positive for headaches.  Psychiatric/Behavioral:  The patient is nervous/anxious.   All other systems reviewed and are negative.      Objective:   Physical Exam Vitals reviewed.  Constitutional:      General: She is not in acute distress.    Appearance: She is well-developed. She  is obese.  HENT:     Head:  Normocephalic and atraumatic.     Right Ear: External ear normal.  Eyes:     Pupils: Pupils are equal, round, and reactive to light.  Neck:     Thyroid: No thyromegaly.  Cardiovascular:     Rate and Rhythm: Normal rate and regular rhythm.     Heart sounds: Normal heart sounds. No murmur heard. Pulmonary:     Effort: Pulmonary effort is normal. No respiratory distress.     Breath sounds: Normal breath sounds. No wheezing.  Abdominal:     General: Bowel sounds are normal. There is no distension.     Palpations: Abdomen is soft.     Tenderness: There is no abdominal tenderness.  Musculoskeletal:        General: No tenderness. Normal range of motion.     Cervical back: Normal range of motion and neck supple.  Skin:    General: Skin is warm and dry.  Neurological:     Mental Status: She is alert and oriented to person, place, and time.     Cranial Nerves: No cranial nerve deficit.     Deep Tendon Reflexes: Reflexes are normal and symmetric.  Psychiatric:        Behavior: Behavior normal.        Thought Content: Thought content normal.        Judgment: Judgment normal.       BP (!) 148/97   Pulse 71   Temp (!) 97.2 F (36.2 C) (Temporal)   Ht 5' 3"$  (1.6 m)   Wt 236 lb 3.2 oz (107.1 kg)   LMP 06/29/2017   SpO2 99%   BMI 41.84 kg/m      Assessment & Plan:   [name] comes in today with chief complaint of Medical Management of Chronic Issues, Headache (All last week and the weekend before.), and Abdominal Pain (2-3 weeks ago. Has seen blood when having BM bright read blood. Stomach gets haed )   Diagnosis and orders addressed:  1. CKD stage 3 due to type 2 diabetes mellitus (HCC) - CMP14+EGFR - CBC with Differential/Platelet  2. Dyslipidemia - CMP14+EGFR - CBC with Differential/Platelet  3. GAD (generalized anxiety disorder) - CMP14+EGFR - CBC with Differential/Platelet  4. Hyperlipidemia associated with type 2 diabetes mellitus (HCC) - CMP14+EGFR -  CBC with Differential/Platelet  5. Hypertension associated with diabetes ([place]) - CMP14+EGFR - CBC with Differential/Platelet  6. Iron deficiency - CMP14+EGFR - CBC with Differential/Platelet  7. Morbid obesity ([place]) - CMP14+EGFR - CBC with Differential/Platelet  8. Myalgia due to statin - CMP14+EGFR - CBC with Differential/Platelet  9. OSA (obstructive sleep apnea)  - CMP14+EGFR - CBC with Differential/Platelet  10. Type 2 diabetes mellitus with hyperglycemia, without long-term current use of insulin (HCC) - CMP14+EGFR - CBC with Differential/Platelet - Bayer DCA Hb A1c Waived  11. Vitamin D deficiency  - CMP14+EGFR - CBC with Differential/Platelet  12. Hypothyroidism, unspecified type - CMP14+EGFR - CBC with Differential/Platelet - TSH  13. Stage 3b chronic kidney disease (HCC) - CMP14+EGFR - CBC with Differential/Platelet  14. Right lower quadrant abdominal pain -[place] pending  - CMP14+EGFR - CBC with Differential/Platelet - US Pelvic Complete With Transvaginal; Future   Labs pending Health Maintenance reviewed Diet and exercise encouraged  Follow up plan: 2 weeks to recheck HTN and follow up abdominal    Evelina Dun, FNP

## 2022-12-30 NOTE — Patient Instructions (Addendum)
Hypertension, Adult High blood pressure (hypertension) is when the force of blood pumping through the arteries is too strong. The arteries are the blood vessels that carry blood from the heart throughout the body. Hypertension forces the heart to work harder to pump blood and may cause arteries to become narrow or stiff. Untreated or uncontrolled hypertension can lead to a heart attack, heart failure, a stroke, kidney disease, and other problems. A blood pressure reading consists of a higher number over a lower number. Ideally, your blood pressure should be below 120/80. The first ("top") number is called the systolic pressure. It is a measure of the pressure in your arteries as your heart beats. The second ("bottom") number is called the diastolic pressure. It is a measure of the pressure in your arteries as the heart relaxes. What are the causes? The exact cause of this condition is not known. There are some conditions that result in high blood pressure. What increases the risk? Certain factors may make you more likely to develop high blood pressure. Some of these risk factors are under your control, including: Smoking. Not getting enough exercise or physical activity. Being overweight. Having too much fat, sugar, calories, or salt (sodium) in your diet. Drinking too much alcohol. Other risk factors include: Having a personal history of heart disease, diabetes, high cholesterol, or kidney disease. Stress. Having a family history of high blood pressure and high cholesterol. Having obstructive sleep apnea. Age. The risk increases with age. What are the signs or symptoms? High blood pressure may not cause symptoms. Very high blood pressure (hypertensive crisis) may cause: Headache. Fast or irregular heartbeats (palpitations). Shortness of breath. Nosebleed. Nausea and vomiting. Vision changes. Severe chest pain, dizziness, and seizures. How is this diagnosed? This condition is diagnosed by  measuring your blood pressure while you are seated, with your arm resting on a flat surface, your legs uncrossed, and your feet flat on the floor. The cuff of the blood pressure monitor will be placed directly against the skin of your upper arm at the level of your heart. Blood pressure should be measured at least twice using the same arm. Certain conditions can cause a difference in blood pressure between your right and left arms. If you have a high blood pressure reading during one visit or you have normal blood pressure with other risk factors, you may be asked to: Return on a different day to have your blood pressure checked again. Monitor your blood pressure at home for 1 week or longer. If you are diagnosed with hypertension, you may have other blood or imaging tests to help your health care provider understand your overall risk for other conditions. How is this treated? This condition is treated by making healthy lifestyle changes, such as eating healthy foods, exercising more, and reducing your alcohol intake. You may be referred for counseling on a healthy diet and physical activity. Your health care provider may prescribe medicine if lifestyle changes are not enough to get your blood pressure under control and if: Your systolic blood pressure is above 130. Your diastolic blood pressure is above 80. Your personal target blood pressure may vary depending on your medical conditions, your age, and other factors. Follow these instructions at home: Eating and drinking  Eat a diet that is high in fiber and potassium, and low in sodium, added sugar, and fat. An example of this eating plan is called the DASH diet. DASH stands for Dietary Approaches to Stop Hypertension. To eat this way: Eat   plenty of fresh fruits and vegetables. Try to fill one half of your plate at each meal with fruits and vegetables. Eat whole grains, such as whole-wheat pasta, brown rice, or whole-grain bread. Fill about one  fourth of your plate with whole grains. Eat or drink low-fat dairy products, such as skim milk or low-fat yogurt. Avoid fatty cuts of meat, processed or cured meats, and poultry with skin. Fill about one fourth of your plate with lean proteins, such as fish, chicken without skin, beans, eggs, or tofu. Avoid pre-made and processed foods. These tend to be higher in sodium, added sugar, and fat. Reduce your daily sodium intake. Many people with hypertension should eat less than 1,500 mg of sodium a day. Do not drink alcohol if: Your health care provider tells you not to drink. You are pregnant, may be pregnant, or are planning to become pregnant. If you drink alcohol: Limit how much you have to: 0-1 drink a day for women. 0-2 drinks a day for men. Know how much alcohol is in your drink. In the [place] one drink equals one 12 oz bottle of beer (355 mL), one 5 oz glass of wine (148 mL), or one 1 oz glass of hard liquor (44 mL). Lifestyle  Work with your health care provider to maintain a healthy body weight or to lose weight. Ask what an ideal weight is for you. Get at least 30 minutes of exercise that causes your heart to beat faster (aerobic exercise) most days of the week. Activities may include walking, swimming, or biking. Include exercise to strengthen your muscles (resistance exercise), such as Pilates or lifting weights, as part of your weekly exercise routine. Try to do these types of exercises for 30 minutes at least 3 days a week. Do not use any products that contain nicotine or tobacco. These products include cigarettes, chewing tobacco, and vaping devices, such as e-cigarettes. If you need help quitting, ask your health care provider. Monitor your blood pressure at home as told by your health care provider. Keep all follow-up visits. This is important. Medicines Take over-the-counter and prescription medicines only as told by your health care provider. Follow directions carefully. Blood  pressure medicines must be taken as prescribed. Do not skip doses of blood pressure medicine. Doing this puts you at risk for problems and can make the medicine less effective. Ask your health care provider about side effects or reactions to medicines that you should watch for. Contact a health care provider if you: Think you are having a reaction to a medicine you are taking. Have headaches that keep coming back (recurring). Feel dizzy. Have swelling in your ankles. Have trouble with your vision. Get help right away if you: Develop a severe headache or confusion. Have unusual weakness or numbness. Feel faint. Have severe pain in your chest or abdomen. Vomit repeatedly. Have trouble breathing. These symptoms may be an emergency. Get help right away. Call 911. Do not wait to see if the symptoms will go away. Do not drive yourself to the hospital. Summary Hypertension is when the force of blood pumping through your arteries is too strong. If this condition is not controlled, it may put you at risk for serious complications. Your personal target blood pressure may vary depending on your medical conditions, your age, and other factors. For most people, a normal blood pressure is less than 120/80. Hypertension is treated with lifestyle changes, medicines, or a combination of both. Lifestyle changes include losing weight, eating a healthy,   low-sodium diet, exercising more, and limiting alcohol. This information is not intended to replace advice given to you by your health care provider. Make sure you discuss any questions you have with your health care provider. Document Revised: 09/11/2021 Document Reviewed: 09/11/2021 Elsevier Patient Education  2023 [employer]  

## 2022-12-31 LAB — CMP14+EGFR
ALT: 24 IU/L (ref 0–32)
AST: 23 IU/L (ref 0–40)
Albumin/Globulin Ratio: 1.8 (ref 1.2–2.2)
Albumin: 4.6 g/dL (ref 3.8–4.9)
Alkaline Phosphatase: 91 IU/L (ref 44–121)
BUN/Creatinine Ratio: 16 (ref 9–23)
BUN: 15 mg/dL (ref 6–24)
Bilirubin Total: 0.3 mg/dL (ref 0.0–1.2)
CO2: 24 mmol/L (ref 20–29)
Calcium: 9.5 mg/dL (ref 8.7–10.2)
Chloride: 103 mmol/L (ref 96–106)
Creatinine, Ser: 0.95 mg/dL (ref 0.57–1.00)
Globulin, Total: 2.6 g/dL (ref 1.5–4.5)
Glucose: 102 mg/dL — ABNORMAL HIGH (ref 70–99)
Potassium: 3.6 mmol/L (ref 3.5–5.2)
Sodium: 141 mmol/L (ref 134–144)
Total Protein: 7.2 g/dL (ref 6.0–8.5)
eGFR: 70 mL/min/{1.73_m2} (ref >59)

## 2022-12-31 LAB — CBC WITH DIFFERENTIAL/PLATELET
Basophils Absolute: 0.1 10*3/uL (ref 0.0–0.2)
Basos: 1 %
EOS (ABSOLUTE): 0.3 10*3/uL (ref 0.0–0.4)
Eos: 3 %
Hematocrit: 42.7 % (ref 34.0–46.6)
Hemoglobin: 14.3 g/dL (ref 11.1–15.9)
Immature Grans (Abs): 0 10*3/uL (ref 0.0–0.1)
Immature Granulocytes: 0 %
Lymphocytes Absolute: 4.2 10*3/uL — ABNORMAL HIGH (ref 0.7–3.1)
Lymphs: 46 %
MCH: 29 pg (ref 26.6–33.0)
MCHC: 33.5 g/dL (ref 31.5–35.7)
MCV: 87 fL (ref 79–97)
Monocytes Absolute: 0.5 10*3/uL (ref 0.1–0.9)
Monocytes: 6 %
Neutrophils Absolute: 4 10*3/uL (ref 1.4–7.0)
Neutrophils: 44 %
Platelets: 312 10*3/uL (ref 150–450)
RBC: 4.93 x10E6/uL (ref 3.77–5.28)
RDW: 13.5 % (ref 11.7–15.4)
WBC: 9.1 10*3/uL (ref 3.4–10.8)

## 2022-12-31 LAB — TSH: TSH: 1.76 u[IU]/mL (ref 0.450–4.500)

## 2023-01-08 ENCOUNTER — Ambulatory Visit (HOSPITAL_COMMUNITY): Payer: BC Managed Care – PPO

## 2023-01-14 ENCOUNTER — Ambulatory Visit (HOSPITAL_COMMUNITY): Payer: BC Managed Care – PPO

## 2023-01-21 ENCOUNTER — Ambulatory Visit: Payer: BC Managed Care – PPO | Admitting: Family

## 2023-01-24 ENCOUNTER — Ambulatory Visit: Payer: BC Managed Care – PPO | Admitting: Family

## 2023-01-24 ENCOUNTER — Encounter: Payer: Self-pay | Admitting: Family

## 2023-01-24 VITALS — BP 120/84 | HR 82 | Temp 96.9°F | Ht 63.0 in | Wt 234.0 lb

## 2023-01-24 DIAGNOSIS — E1159 Type 2 diabetes mellitus with other circulatory complications: Secondary | ICD-10-CM | POA: Diagnosis not present

## 2023-01-24 DIAGNOSIS — I152 Hypertension secondary to endocrine disorders: Secondary | ICD-10-CM

## 2023-01-24 DIAGNOSIS — E1169 Type 2 diabetes mellitus with other specified complication: Secondary | ICD-10-CM | POA: Diagnosis not present

## 2023-01-24 DIAGNOSIS — Z6841 Body Mass Index (BMI) 40.0 and over, adult: Secondary | ICD-10-CM

## 2023-01-24 DIAGNOSIS — E1165 Type 2 diabetes mellitus with hyperglycemia: Secondary | ICD-10-CM | POA: Diagnosis not present

## 2023-01-24 DIAGNOSIS — E785 Hyperlipidemia, unspecified: Secondary | ICD-10-CM

## 2023-01-24 MED ORDER — TIRZEPATIDE 7.5 MG/0.5ML ~~LOC~~ SOAJ: 7.5000 mg | SUBCUTANEOUS | 2 refills | Status: DC

## 2023-01-24 NOTE — Patient Instructions (Signed)
Hypertension, Adult High blood pressure (hypertension) is when the force of blood pumping through the arteries is too strong. The arteries are the blood vessels that carry blood from the heart throughout the body. Hypertension forces the heart to work harder to pump blood and may cause arteries to become narrow or stiff. Untreated or uncontrolled hypertension can lead to a heart attack, heart failure, a stroke, kidney disease, and other problems. A blood pressure reading consists of a higher number over a lower number. Ideally, your blood pressure should be below 120/80. The first ("top") number is called the systolic pressure. It is a measure of the pressure in your arteries as your heart beats. The second ("bottom") number is called the diastolic pressure. It is a measure of the pressure in your arteries as the heart relaxes. What are the causes? The exact cause of this condition is not known. There are some conditions that result in high blood pressure. What increases the risk? Certain factors may make you more likely to develop high blood pressure. Some of these risk factors are under your control, including: Smoking. Not getting enough exercise or physical activity. Being overweight. Having too much fat, sugar, calories, or salt (sodium) in your diet. Drinking too much alcohol. Other risk factors include: Having a personal history of heart disease, diabetes, high cholesterol, or kidney disease. Stress. Having a family history of high blood pressure and high cholesterol. Having obstructive sleep apnea. Age. The risk increases with age. What are the signs or symptoms? High blood pressure may not cause symptoms. Very high blood pressure (hypertensive crisis) may cause: Headache. Fast or irregular heartbeats (palpitations). Shortness of breath. Nosebleed. Nausea and vomiting. Vision changes. Severe chest pain, dizziness, and seizures. How is this diagnosed? This condition is diagnosed by  measuring your blood pressure while you are seated, with your arm resting on a flat surface, your legs uncrossed, and your feet flat on the floor. The cuff of the blood pressure monitor will be placed directly against the skin of your upper arm at the level of your heart. Blood pressure should be measured at least twice using the same arm. Certain conditions can cause a difference in blood pressure between your right and left arms. If you have a high blood pressure reading during one visit or you have normal blood pressure with other risk factors, you may be asked to: Return on a different day to have your blood pressure checked again. Monitor your blood pressure at home for 1 week or longer. If you are diagnosed with hypertension, you may have other blood or imaging tests to help your health care provider understand your overall risk for other conditions. How is this treated? This condition is treated by making healthy lifestyle changes, such as eating healthy foods, exercising more, and reducing your alcohol intake. You may be referred for counseling on a healthy diet and physical activity. Your health care provider may prescribe medicine if lifestyle changes are not enough to get your blood pressure under control and if: Your systolic blood pressure is above 130. Your diastolic blood pressure is above 80. Your personal target blood pressure may vary depending on your medical conditions, your age, and other factors. Follow these instructions at home: Eating and drinking  Eat a diet that is high in fiber and potassium, and low in sodium, added sugar, and fat. An example of this eating plan is called the DASH diet. DASH stands for Dietary Approaches to Stop Hypertension. To eat this way: Eat   plenty of fresh fruits and vegetables. Try to fill one half of your plate at each meal with fruits and vegetables. Eat whole grains, such as whole-wheat pasta, brown rice, or whole-grain bread. Fill about one  fourth of your plate with whole grains. Eat or drink low-fat dairy products, such as skim milk or low-fat yogurt. Avoid fatty cuts of meat, processed or cured meats, and poultry with skin. Fill about one fourth of your plate with lean proteins, such as fish, chicken without skin, beans, eggs, or tofu. Avoid pre-made and processed foods. These tend to be higher in sodium, added sugar, and fat. Reduce your daily sodium intake. Many people with hypertension should eat less than 1,500 mg of sodium a day. Do not drink alcohol if: Your health care provider tells you not to drink. You are pregnant, may be pregnant, or are planning to become pregnant. If you drink alcohol: Limit how much you have to: 0-1 drink a day for women. 0-2 drinks a day for men. Know how much alcohol is in your drink. In the [place] one drink equals one 12 oz bottle of beer (355 mL), one 5 oz glass of wine (148 mL), or one 1 oz glass of hard liquor (44 mL). Lifestyle  Work with your health care provider to maintain a healthy body weight or to lose weight. Ask what an ideal weight is for you. Get at least 30 minutes of exercise that causes your heart to beat faster (aerobic exercise) most days of the week. Activities may include walking, swimming, or biking. Include exercise to strengthen your muscles (resistance exercise), such as Pilates or lifting weights, as part of your weekly exercise routine. Try to do these types of exercises for 30 minutes at least 3 days a week. Do not use any products that contain nicotine or tobacco. These products include cigarettes, chewing tobacco, and vaping devices, such as e-cigarettes. If you need help quitting, ask your health care provider. Monitor your blood pressure at home as told by your health care provider. Keep all follow-up visits. This is important. Medicines Take over-the-counter and prescription medicines only as told by your health care provider. Follow directions carefully. Blood  pressure medicines must be taken as prescribed. Do not skip doses of blood pressure medicine. Doing this puts you at risk for problems and can make the medicine less effective. Ask your health care provider about side effects or reactions to medicines that you should watch for. Contact a health care provider if you: Think you are having a reaction to a medicine you are taking. Have headaches that keep coming back (recurring). Feel dizzy. Have swelling in your ankles. Have trouble with your vision. Get help right away if you: Develop a severe headache or confusion. Have unusual weakness or numbness. Feel faint. Have severe pain in your chest or abdomen. Vomit repeatedly. Have trouble breathing. These symptoms may be an emergency. Get help right away. Call 911. Do not wait to see if the symptoms will go away. Do not drive yourself to the hospital. Summary Hypertension is when the force of blood pumping through your arteries is too strong. If this condition is not controlled, it may put you at risk for serious complications. Your personal target blood pressure may vary depending on your medical conditions, your age, and other factors. For most people, a normal blood pressure is less than 120/80. Hypertension is treated with lifestyle changes, medicines, or a combination of both. Lifestyle changes include losing weight, eating a healthy,   low-sodium diet, exercising more, and limiting alcohol. This information is not intended to replace advice given to you by your health care provider. Make sure you discuss any questions you have with your health care provider. Document Revised: 09/11/2021 Document Reviewed: 09/11/2021 Elsevier Patient Education  2023 [employer] Inc.  

## 2023-01-24 NOTE — Progress Notes (Signed)
Subjective:    Patient ID: Holly Rodriguez, female    DOB: 1966/05/15, 57 y.o.   MRN: [id number]  No chief complaint on file.  Pt presents to the office today to recheck HTN. She was started on Losartan 50 mg. Her BP is at goal today! Hypertension This is a chronic problem. The current episode started more than 1 year ago. The problem has been resolved since onset. The problem is controlled. Associated symptoms include malaise/fatigue. Pertinent negatives include no blurred vision, peripheral edema or shortness of breath. Risk factors for coronary artery disease include diabetes mellitus, dyslipidemia, obesity and sedentary lifestyle. The current treatment provides moderate improvement.  Diabetes She presents for her follow-up diabetic visit. She has type 2 diabetes mellitus. Pertinent negatives for diabetes include no blurred vision and no foot paresthesias. Symptoms are stable. Risk factors for coronary artery disease include dyslipidemia, diabetes mellitus, hypertension and sedentary lifestyle. She is following a generally healthy diet. Her overall blood glucose range is 140-180 mg/dl. Eye exam is current.  Hyperlipidemia This is a chronic problem. The current episode started more than 1 year ago. Exacerbating diseases include obesity. Pertinent negatives include no shortness of breath. The current treatment provides mild improvement of lipids. Risk factors for coronary artery disease include dyslipidemia, diabetes mellitus, hypertension and a sedentary lifestyle.      Review of Systems  Constitutional:  Positive for malaise/fatigue.  Eyes:  Negative for blurred vision.  Respiratory:  Negative for shortness of breath.   All other systems reviewed and are negative.      Objective:   Physical Exam Vitals reviewed.  Constitutional:      General: She is not in acute distress.    Appearance: She is well-developed. She is obese.  HENT:     Head: Normocephalic and atraumatic.      Right Ear: Tympanic membrane normal.     Left Ear: Tympanic membrane normal.  Eyes:     Pupils: Pupils are equal, round, and reactive to light.  Neck:     Thyroid: No thyromegaly.  Cardiovascular:     Rate and Rhythm: Normal rate and regular rhythm.     Heart sounds: Normal heart sounds. No murmur heard. Pulmonary:     Effort: Pulmonary effort is normal. No respiratory distress.     Breath sounds: Normal breath sounds. No wheezing.  Abdominal:     General: Bowel sounds are normal. There is no distension.     Palpations: Abdomen is soft.     Tenderness: There is no abdominal tenderness.  Musculoskeletal:        General: No tenderness. Normal range of motion.     Cervical back: Normal range of motion and neck supple.  Skin:    General: Skin is warm and dry.  Neurological:     Mental Status: She is alert and oriented to person, place, and time.     Cranial Nerves: No cranial nerve deficit.     Deep Tendon Reflexes: Reflexes are normal and symmetric.  Psychiatric:        Behavior: Behavior normal.        Thought Content: Thought content normal.        Judgment: Judgment normal.       BP 120/84   Pulse 82   Temp (!) 96.9 F (36.1 C) (Temporal)   Ht '5\' 3"'$  (1.6 m)   Wt 234 lb (106.1 kg)   LMP 06/29/2017   SpO2 95%   BMI 41.45 kg/m  Assessment & Plan:  Nalany Laub comes in today with chief complaint of Follow-up   Diagnosis and orders addressed:  1. Hypertension associated with diabetes ([place]) At goal today - CMP14+EGFR  2. Type 2 diabetes mellitus with hyperglycemia, without long-term current use of insulin ([facility]) Will change Ozempic 2 mg to Mounjaro 7.5 mg Strict low carb diet  - tirzepatide (MOUNJARO) 7.5 MG/0.5ML Pen; Inject 7.5 mg into the skin once a week.  Dispense: 6 mL; Refill: 2 - CMP14+EGFR  3. Hyperlipidemia associated with type 2 diabetes mellitus ([place]) - CMP14+EGFR  4. Morbid obesity ([place])   Labs pending Health Maintenance  reviewed Diet and exercise encouraged  Follow up plan: 2 months    Evelina Dun, FNP

## 2023-01-25 LAB — CMP14+EGFR
ALT: 35 IU/L — ABNORMAL HIGH (ref 0–32)
AST: 54 IU/L — ABNORMAL HIGH (ref 0–40)
Albumin/Globulin Ratio: 1.6 (ref 1.2–2.2)
Albumin: 4.5 g/dL (ref 3.8–4.9)
Alkaline Phosphatase: 91 IU/L (ref 44–121)
BUN/Creatinine Ratio: 14 (ref 9–23)
BUN: 17 mg/dL (ref 6–24)
Bilirubin Total: 0.3 mg/dL (ref 0.0–1.2)
CO2: 21 mmol/L (ref 20–29)
Calcium: 9.9 mg/dL (ref 8.7–10.2)
Chloride: 101 mmol/L (ref 96–106)
Creatinine, Ser: 1.21 mg/dL — ABNORMAL HIGH (ref 0.57–1.00)
Globulin, Total: 2.9 g/dL (ref 1.5–4.5)
Glucose: 110 mg/dL — ABNORMAL HIGH (ref 70–99)
Potassium: 3.8 mmol/L (ref 3.5–5.2)
Sodium: 140 mmol/L (ref 134–144)
Total Protein: 7.4 g/dL (ref 6.0–8.5)
eGFR: 53 mL/min/{1.73_m2} — ABNORMAL LOW (ref >59)

## 2023-02-24 ENCOUNTER — Other Ambulatory Visit: Payer: Self-pay | Admitting: Family

## 2023-02-24 DIAGNOSIS — B372 Candidiasis of skin and nail: Secondary | ICD-10-CM

## 2023-02-25 ENCOUNTER — Telehealth: Payer: Self-pay

## 2023-02-25 ENCOUNTER — Other Ambulatory Visit (HOSPITAL_COMMUNITY): Payer: Self-pay

## 2023-02-25 ENCOUNTER — Encounter (HOSPITAL_COMMUNITY): Payer: Self-pay | Admitting: Hematology and Oncology

## 2023-02-25 NOTE — Telephone Encounter (Signed)
Pharmacy Patient Advocate Encounter   Received notification that prior authorization for [facility] 7.5mg  is required/requested.  Per Test Claim: PA required   PA submitted on 02/25/23 to (ins) [facility] via [email or web address] Prior Auth (EOC) ID: [id number] Status is pending

## 2023-02-26 NOTE — Telephone Encounter (Signed)
Patient Advocate Encounter  Prior Authorization for [facility] 7.5mg   has been approved.    PA# [id number]  Effective dates: 02/25/23 through 02/24/24

## 2023-02-27 DIAGNOSIS — L718 Other rosacea: Secondary | ICD-10-CM | POA: Diagnosis not present

## 2023-02-27 DIAGNOSIS — C44529 Squamous cell carcinoma of skin of other part of trunk: Secondary | ICD-10-CM | POA: Diagnosis not present

## 2023-02-27 DIAGNOSIS — L989 Disorder of the skin and subcutaneous tissue, unspecified: Secondary | ICD-10-CM | POA: Diagnosis not present

## 2023-02-27 DIAGNOSIS — D225 Melanocytic nevi of trunk: Secondary | ICD-10-CM | POA: Diagnosis not present

## 2023-02-27 DIAGNOSIS — L821 Other seborrheic keratosis: Secondary | ICD-10-CM | POA: Diagnosis not present

## 2023-02-27 DIAGNOSIS — D485 Neoplasm of uncertain behavior of skin: Secondary | ICD-10-CM | POA: Diagnosis not present

## 2023-02-27 DIAGNOSIS — L570 Actinic keratosis: Secondary | ICD-10-CM | POA: Diagnosis not present

## 2023-02-27 DIAGNOSIS — L814 Other melanin hyperpigmentation: Secondary | ICD-10-CM | POA: Diagnosis not present

## 2023-03-20 DIAGNOSIS — C44529 Squamous cell carcinoma of skin of other part of trunk: Secondary | ICD-10-CM | POA: Diagnosis not present

## 2023-03-27 ENCOUNTER — Ambulatory Visit: Payer: BC Managed Care – PPO | Admitting: Family

## 2023-04-22 ENCOUNTER — Ambulatory Visit: Payer: BC Managed Care – PPO | Admitting: Family

## 2023-04-22 ENCOUNTER — Encounter: Payer: Self-pay | Admitting: Family

## 2023-04-22 VITALS — BP 120/81 | HR 80 | Temp 97.5°F | Ht 63.0 in | Wt 239.8 lb

## 2023-04-22 DIAGNOSIS — R1084 Generalized abdominal pain: Secondary | ICD-10-CM

## 2023-04-22 DIAGNOSIS — K644 Residual hemorrhoidal skin tags: Secondary | ICD-10-CM | POA: Diagnosis not present

## 2023-04-22 DIAGNOSIS — Z6841 Body Mass Index (BMI) 40.0 and over, adult: Secondary | ICD-10-CM

## 2023-04-22 DIAGNOSIS — E1165 Type 2 diabetes mellitus with hyperglycemia: Secondary | ICD-10-CM

## 2023-04-22 DIAGNOSIS — K921 Melena: Secondary | ICD-10-CM

## 2023-04-22 LAB — BAYER DCA HB A1C WAIVED: HB A1C (BAYER DCA - WAIVED): 6.7 % — ABNORMAL HIGH (ref 4.8–5.6)

## 2023-04-22 MED ORDER — TIRZEPATIDE 10 MG/0.5ML ~~LOC~~ SOAJ: 10.0000 mg | SUBCUTANEOUS | 2 refills | Status: DC

## 2023-04-22 NOTE — Patient Instructions (Signed)

## 2023-04-22 NOTE — Progress Notes (Signed)
Subjective:    Patient ID: Holly Rodriguez, female    DOB: [birth year], 57 y.o.   MRN: [id number]  Chief Complaint  Patient presents with   Diabetes   Blood In Stools    Off and on for a month bright read blood Hx of internal hemorrhoids    Abdominal Pain   Pt presents to the office today to follow up on DM. She was seen on 01/24/23 and her Ozempic 2 mg and we changed to Moujaro 7.5 mg. Reports her glucose has been 120-145's in the AM.      04/22/2023    3:00 PM 01/24/2023   10:27 AM 12/30/2022    3:00 PM  Last 3 Weights  Weight (lbs) 239 lb 12.8 oz 234 lb 236 lb 3.2 oz  Weight (kg) 108.773 kg 106.142 kg 107.14 kg     Diabetes She presents for her follow-up diabetic visit. She has type 2 diabetes mellitus. Pertinent negatives for hypoglycemia include no headaches. Pertinent negatives for diabetes include no blurred vision and no foot paresthesias. Symptoms are stable. Diabetic complications include peripheral neuropathy. Risk factors for coronary artery disease include dyslipidemia, diabetes mellitus, hypertension and sedentary lifestyle. Her overall blood glucose range is 110-130 mg/dl.  Abdominal Pain This is a new problem. The current episode started 1 to 4 weeks ago. The problem occurs intermittently. The pain is located in the generalized abdominal region. The pain is at a severity of 8/10. The pain is mild. The quality of the pain is cramping. Associated symptoms include diarrhea and flatus. Pertinent negatives include no constipation, fever, frequency, headaches, hematochezia, hematuria, nausea or vomiting. Associated symptoms comments: Bright red Blood in stools.  Hypertension This is a chronic problem. The current episode started more than 1 year ago. The problem has been resolved since onset. The problem is controlled. Associated symptoms include malaise/fatigue and peripheral edema. Pertinent negatives include no blurred vision, headaches or shortness of breath. Risk factors  for coronary artery disease include diabetes mellitus, dyslipidemia, obesity and sedentary lifestyle. The current treatment provides moderate improvement.      Review of Systems  Constitutional:  Positive for malaise/fatigue. Negative for fever.  Eyes:  Negative for blurred vision.  Respiratory:  Negative for shortness of breath.   Gastrointestinal:  Positive for abdominal pain, diarrhea and flatus. Negative for constipation, hematochezia, nausea and vomiting.  Genitourinary:  Negative for frequency and hematuria.  Neurological:  Negative for headaches.  All other systems reviewed and are negative.      Objective:   Physical Exam Vitals reviewed.  Constitutional:      General: She is not in acute distress.    Appearance: She is well-developed. She is obese.  HENT:     Head: Normocephalic and atraumatic.     Right Ear: External ear normal.  Eyes:     Pupils: Pupils are equal, round, and reactive to light.  Neck:     Thyroid: No thyromegaly.  Cardiovascular:     Rate and Rhythm: Normal rate and regular rhythm.     Heart sounds: Normal heart sounds. No murmur heard. Pulmonary:     Effort: Pulmonary effort is normal. No respiratory distress.     Breath sounds: Normal breath sounds. No wheezing.  Abdominal:     General: Bowel sounds are normal. There is no distension.     Palpations: Abdomen is soft.     Tenderness: There is no abdominal tenderness (no tenderness on exam noted).  Musculoskeletal:  General: No tenderness. Normal range of motion.     Cervical back: Normal range of motion and neck supple.  Skin:    General: Skin is warm and dry.  Neurological:     Mental Status: She is alert and oriented to person, place, and time.     Cranial Nerves: No cranial nerve deficit.     Deep Tendon Reflexes: Reflexes are normal and symmetric.  Psychiatric:        Behavior: Behavior normal.        Thought Content: Thought content normal.        Judgment: Judgment normal.         BP 120/81   Pulse 80   Temp (!) 97.5 F (36.4 C) (Temporal)   Ht 5\' 3"  (1.6 m)   Wt 239 lb 12.8 oz (108.8 kg)   LMP 06/29/2017   SpO2 98%   BMI 42.48 kg/m   Assessment & Plan:  Ishita Lauletta comes in today with chief complaint of Diabetes, Blood In Stools (Off and on for a month bright read blood/Hx of internal hemorrhoids ), and Abdominal Pain   Diagnosis and orders addressed:  1. Hemorrhoids, external - CMP14+EGFR - CBC with Differential/Platelet  2. Morbid obesity (HCC) - CMP14+EGFR  3. Type 2 diabetes mellitus with hyperglycemia, without long-term current use of insulin (HCC) Will increase Mounjaro to 10 mg from 7.5 mg Low carb diet - tirzepatide (MOUNJARO) 10 MG/0.5ML Pen; Inject 10 mg into the skin once a week.  Dispense: 6 mL; Refill: 2 - Bayer DCA Hb A1c Waived - CMP14+EGFR  4. Generalized abdominal pain - CMP14+EGFR  5. Blood in stool - CMP14+EGFR - CBC with Differential/Platelet  PT has called GI provider, is due for Colonoscopy 01/25. Pt needs to be seen earlier given blood. Could be hemorrhoids since bright red.  Labs pending Health Maintenance reviewed Diet and exercise encouraged  Follow up plan: 3 months   Jannifer Rodney, FNP

## 2023-04-23 LAB — CMP14+EGFR
ALT: 21 IU/L (ref 0–32)
AST: 28 IU/L (ref 0–40)
Albumin/Globulin Ratio: 1.8 (ref 1.2–2.2)
Albumin: 4.5 g/dL (ref 3.8–4.9)
Alkaline Phosphatase: 78 IU/L (ref 44–121)
BUN/Creatinine Ratio: 18 (ref 9–23)
BUN: 20 mg/dL (ref 6–24)
Bilirubin Total: 0.3 mg/dL (ref 0.0–1.2)
CO2: 23 mmol/L (ref 20–29)
Calcium: 10 mg/dL (ref 8.7–10.2)
Chloride: 102 mmol/L (ref 96–106)
Creatinine, Ser: 1.13 mg/dL — ABNORMAL HIGH (ref 0.57–1.00)
Globulin, Total: 2.5 g/dL (ref 1.5–4.5)
Glucose: 100 mg/dL — ABNORMAL HIGH (ref 70–99)
Potassium: 4 mmol/L (ref 3.5–5.2)
Sodium: 141 mmol/L (ref 134–144)
Total Protein: 7 g/dL (ref 6.0–8.5)
eGFR: 57 mL/min/{1.73_m2} — ABNORMAL LOW (ref >59)

## 2023-04-23 LAB — CBC WITH DIFFERENTIAL/PLATELET

## 2023-05-06 ENCOUNTER — Other Ambulatory Visit: Payer: Self-pay | Admitting: Family

## 2023-05-06 DIAGNOSIS — E079 Disorder of thyroid, unspecified: Secondary | ICD-10-CM

## 2023-05-06 DIAGNOSIS — E1169 Type 2 diabetes mellitus with other specified complication: Secondary | ICD-10-CM

## 2023-05-06 DIAGNOSIS — M109 Gout, unspecified: Secondary | ICD-10-CM

## 2023-06-10 ENCOUNTER — Other Ambulatory Visit: Payer: Self-pay | Admitting: Family

## 2023-06-18 DIAGNOSIS — K625 Hemorrhage of anus and rectum: Secondary | ICD-10-CM | POA: Diagnosis not present

## 2023-06-18 DIAGNOSIS — Z86010 Personal history of colonic polyps: Secondary | ICD-10-CM | POA: Diagnosis not present

## 2023-06-18 DIAGNOSIS — R1084 Generalized abdominal pain: Secondary | ICD-10-CM | POA: Diagnosis not present

## 2023-06-27 DIAGNOSIS — Z1231 Encounter for screening mammogram for malignant neoplasm of breast: Secondary | ICD-10-CM | POA: Diagnosis not present

## 2023-06-27 DIAGNOSIS — Z01419 Encounter for gynecological examination (general) (routine) without abnormal findings: Secondary | ICD-10-CM | POA: Diagnosis not present

## 2023-06-27 DIAGNOSIS — Z124 Encounter for screening for malignant neoplasm of cervix: Secondary | ICD-10-CM | POA: Diagnosis not present

## 2023-06-30 DIAGNOSIS — N6012 Diffuse cystic mastopathy of left breast: Secondary | ICD-10-CM | POA: Diagnosis not present

## 2023-06-30 DIAGNOSIS — N6002 Solitary cyst of left breast: Secondary | ICD-10-CM | POA: Diagnosis not present

## 2023-07-01 ENCOUNTER — Other Ambulatory Visit: Payer: Self-pay | Admitting: Obstetrics and Gynecology

## 2023-07-01 DIAGNOSIS — H0102A Squamous blepharitis right eye, upper and lower eyelids: Secondary | ICD-10-CM | POA: Diagnosis not present

## 2023-07-01 DIAGNOSIS — H2513 Age-related nuclear cataract, bilateral: Secondary | ICD-10-CM | POA: Diagnosis not present

## 2023-07-01 DIAGNOSIS — H04123 Dry eye syndrome of bilateral lacrimal glands: Secondary | ICD-10-CM | POA: Diagnosis not present

## 2023-07-01 DIAGNOSIS — E119 Type 2 diabetes mellitus without complications: Secondary | ICD-10-CM | POA: Diagnosis not present

## 2023-07-01 DIAGNOSIS — Z780 Asymptomatic menopausal state: Secondary | ICD-10-CM

## 2023-07-08 ENCOUNTER — Encounter: Payer: Self-pay | Admitting: Family

## 2023-07-18 ENCOUNTER — Encounter: Payer: Self-pay | Admitting: Family

## 2023-07-24 ENCOUNTER — Ambulatory Visit: Payer: BC Managed Care – PPO | Admitting: Family

## 2023-07-24 ENCOUNTER — Encounter: Payer: Self-pay | Admitting: Family

## 2023-07-24 VITALS — BP 130/80 | HR 82 | Temp 97.2°F | Ht 63.0 in | Wt 227.8 lb

## 2023-07-24 DIAGNOSIS — M791 Myalgia, unspecified site: Secondary | ICD-10-CM

## 2023-07-24 DIAGNOSIS — Z0000 Encounter for general adult medical examination without abnormal findings: Secondary | ICD-10-CM | POA: Diagnosis not present

## 2023-07-24 DIAGNOSIS — E1165 Type 2 diabetes mellitus with hyperglycemia: Secondary | ICD-10-CM

## 2023-07-24 DIAGNOSIS — E1159 Type 2 diabetes mellitus with other circulatory complications: Secondary | ICD-10-CM

## 2023-07-24 DIAGNOSIS — G4733 Obstructive sleep apnea (adult) (pediatric): Secondary | ICD-10-CM

## 2023-07-24 DIAGNOSIS — Z0001 Encounter for general adult medical examination with abnormal findings: Secondary | ICD-10-CM

## 2023-07-24 DIAGNOSIS — E039 Hypothyroidism, unspecified: Secondary | ICD-10-CM

## 2023-07-24 DIAGNOSIS — N1831 Chronic kidney disease, stage 3a: Secondary | ICD-10-CM | POA: Diagnosis not present

## 2023-07-24 DIAGNOSIS — E559 Vitamin D deficiency, unspecified: Secondary | ICD-10-CM

## 2023-07-24 DIAGNOSIS — I152 Hypertension secondary to endocrine disorders: Secondary | ICD-10-CM

## 2023-07-24 DIAGNOSIS — F411 Generalized anxiety disorder: Secondary | ICD-10-CM

## 2023-07-24 DIAGNOSIS — E1169 Type 2 diabetes mellitus with other specified complication: Secondary | ICD-10-CM

## 2023-07-24 LAB — BAYER DCA HB A1C WAIVED: HB A1C (BAYER DCA - WAIVED): 5.8 % — ABNORMAL HIGH (ref 4.8–5.6)

## 2023-07-24 NOTE — Patient Instructions (Signed)

## 2023-07-24 NOTE — Progress Notes (Signed)
Subjective:    Patient ID: Holly Rodriguez, female    DOB: 17-Jul-1966, 57 y.o.   MRN: [id number]  Chief Complaint  Patient presents with   Medical Management of Chronic Issues   Pt presents to the office today for CPE and  DM. She was seen on 01/24/23 and her Ozempic 2 mg and we changed to Moujaro 7.5 mg. She has lost 12 lbs since our last visit.      07/24/2023    3:01 PM 04/22/2023    3:00 PM 01/24/2023   10:27 AM  Last 3 Weights  Weight (lbs) 227 lb 12.8 oz 239 lb 12.8 oz 234 lb  Weight (kg) 103.329 kg 108.773 kg 106.142 kg    She has OSA and uses CPAP nightly.   She has CKD that is stable.   She can not tolerate statins because of myalgia.  Diabetes She presents for her follow-up diabetic visit. She has type 2 diabetes mellitus. Pertinent negatives for hypoglycemia include no nervousness/anxiousness. Pertinent negatives for diabetes include no blurred vision, no fatigue and no foot paresthesias. Symptoms are stable. Diabetic complications include peripheral neuropathy. Risk factors for coronary artery disease include dyslipidemia, diabetes mellitus, hypertension, sedentary lifestyle and post-menopausal. (Not checking BS at home) Eye exam is current.  Hypertension This is a chronic problem. The current episode started more than 1 year ago. The problem has been resolved since onset. The problem is controlled. Pertinent negatives include no blurred vision, malaise/fatigue, peripheral edema or shortness of breath. Risk factors for coronary artery disease include dyslipidemia, diabetes mellitus, obesity and sedentary lifestyle. The current treatment provides moderate improvement. Identifiable causes of hypertension include a thyroid problem.  Thyroid Problem Presents for follow-up visit. Symptoms include constipation and dry skin. Patient reports no anxiety, diaphoresis, diarrhea or fatigue. The symptoms have been stable. Her past medical history is significant for hyperlipidemia.   Hyperlipidemia This is a chronic problem. The current episode started more than 1 year ago. Exacerbating diseases include obesity. Pertinent negatives include no shortness of breath. Current antihyperlipidemic treatment includes diet change. The current treatment provides no improvement of lipids. Risk factors for coronary artery disease include dyslipidemia, diabetes mellitus, hypertension, a sedentary lifestyle and post-menopausal.      Review of Systems  Constitutional:  Negative for diaphoresis, fatigue and malaise/fatigue.  Eyes:  Negative for blurred vision.  Respiratory:  Negative for shortness of breath.   Gastrointestinal:  Positive for constipation. Negative for diarrhea.  Psychiatric/Behavioral:  The patient is not nervous/anxious.   All other systems reviewed and are negative.  Family History  Problem Relation Age of Onset   Arthritis Mother    Fibromyalgia Mother    Hyperlipidemia Mother    Arthritis Maternal Grandmother    Heart disease Maternal Grandmother    Alzheimer's disease Maternal Grandfather    Gout Daughter    Gout Son    Social History   Socioeconomic History   Marital status: Married    Spouse name: Not on file   Number of children: Not on file   Years of education: Not on file   Highest education level: Associate degree: occupational, Scientist, product/process development, or vocational program  Occupational History   Not on file  Tobacco Use   Smoking status: Former    Current packs/day: 0.00    Types: Cigarettes    Start date: 11/18/2006    Quit date: 11/18/2008    Years since quitting: 14.6   Smokeless tobacco: Never   Tobacco comments:    quit  smoking about 1o0 years ago but passive smoking exposure  Vaping Use   Vaping status: Never Used  Substance and Sexual Activity   Alcohol use: Yes    Comment: occasional   Drug use: No   Sexual activity: Not on file  Other Topics Concern   Not on file  Social History Narrative   Not on file   Social Determinants of  Health   Financial Resource Strain: Medium Risk (04/21/2023)   Overall Financial Resource Strain (CARDIA)    Difficulty of Paying Living Expenses: Somewhat hard  Food Insecurity: Food Insecurity Present (04/21/2023)   Hunger Vital Sign    Worried About Running Out of Food in the Last Year: Never true    Ran Out of Food in the Last Year: Sometimes true  Transportation Needs: No Transportation Needs (04/21/2023)   PRAPARE - Administrator, Civil Service (Medical): No    Lack of Transportation (Non-Medical): No  Physical Activity: Unknown (04/21/2023)   Exercise Vital Sign    Days of Exercise per Week: 0 days    Minutes of Exercise per Session: Not on file  Stress: Stress Concern Present (04/21/2023)   [employer] of Occupational Health - Occupational Stress Questionnaire    Feeling of Stress : To some extent  Social Connections: Unknown (07/01/2023)   Received from [facility]   Social Network    Social Network: Not on file       Objective:   Physical Exam Vitals reviewed.  Constitutional:      General: She is not in acute distress.    Appearance: She is well-developed. She is obese.  HENT:     Head: Normocephalic and atraumatic.     Right Ear: Tympanic membrane normal.     Left Ear: Tympanic membrane normal.  Eyes:     Pupils: Pupils are equal, round, and reactive to light.  Neck:     Thyroid: No thyromegaly.  Cardiovascular:     Rate and Rhythm: Normal rate and regular rhythm.     Heart sounds: Normal heart sounds. No murmur heard. Pulmonary:     Effort: Pulmonary effort is normal. No respiratory distress.     Breath sounds: Normal breath sounds. No wheezing.  Abdominal:     General: Bowel sounds are normal. There is no distension.     Palpations: Abdomen is soft.     Tenderness: There is no abdominal tenderness.  Musculoskeletal:        General: No tenderness or signs of injury. Normal range of motion.     Cervical back: Normal range of motion and neck  supple.     Right lower leg: Edema (trace) present.     Left lower leg: Edema (trace) present.  Skin:    General: Skin is warm and dry.  Neurological:     Mental Status: She is alert and oriented to person, place, and time.     Cranial Nerves: No cranial nerve deficit.     Deep Tendon Reflexes: Reflexes are normal and symmetric.  Psychiatric:        Behavior: Behavior normal.        Thought Content: Thought content normal.        Judgment: Judgment normal.          BP 130/80   Pulse 82   Temp (!) 97.2 F (36.2 C) (Temporal)   Ht 5\' 3"  (1.6 m)   Wt 227 lb 12.8 oz (103.3 kg)   LMP 06/29/2017  SpO2 99%   BMI 40.35 kg/m   Assessment & Plan:   Holly Rodriguez comes in today with chief complaint of Medical Management of Chronic Issues   Diagnosis and orders addressed:  1. Annual physical exam - Microalbumin / creatinine urine ratio - Bayer DCA Hb A1c Waived - CBC with Differential/Platelet - CMP14+EGFR - TSH - Lipid panel  2. Type 2 diabetes mellitus with hyperglycemia, without long-term current use of insulin (HCC) - Microalbumin / creatinine urine ratio - Bayer DCA Hb A1c Waived - CBC with Differential/Platelet - CMP14+EGFR  3. Vitamin D deficiency - CBC with Differential/Platelet - CMP14+EGFR  4. Stage 3a chronic kidney disease (HCC) - CBC with Differential/Platelet - CMP14+EGFR  5. OSA (obstructive sleep apnea) - CBC with Differential/Platelet - CMP14+EGFR  6. Myalgia due to statin - CBC with Differential/Platelet - CMP14+EGFR  7. Morbid obesity (HCC) - CBC with Differential/Platelet - CMP14+EGFR  8. Hypothyroidism, unspecified type - CBC with Differential/Platelet - CMP14+EGFR - TSH  9. Hypertension associated with diabetes (HCC)  - CBC with Differential/Platelet - CMP14+EGFR  10. Hyperlipidemia associated with type 2 diabetes mellitus (HCC) - CBC with Differential/Platelet - CMP14+EGFR  11. GAD (generalized anxiety disorder) -  CBC with Differential/Platelet - CMP14+EGFR   Labs pending Health Maintenance reviewed Diet and exercise encouraged  Follow up plan: 3 months    Jannifer Rodney, FNP

## 2023-07-25 LAB — LIPID PANEL
Chol/HDL Ratio: 4.4 ratio (ref 0.0–4.4)
Cholesterol, Total: 170 mg/dL (ref 100–199)
HDL: 39 mg/dL — ABNORMAL LOW (ref >39)
LDL Chol Calc (NIH): 84 mg/dL (ref 0–99)
Triglycerides: 282 mg/dL — ABNORMAL HIGH (ref 0–149)
VLDL Cholesterol Cal: 47 mg/dL — ABNORMAL HIGH (ref 5–40)

## 2023-07-25 LAB — CMP14+EGFR
ALT: 21 IU/L (ref 0–32)
AST: 25 IU/L (ref 0–40)
Albumin: 4.4 g/dL (ref 3.8–4.9)
Alkaline Phosphatase: 81 IU/L (ref 44–121)
BUN/Creatinine Ratio: 17 (ref 9–23)
BUN: 20 mg/dL (ref 6–24)
Bilirubin Total: 0.3 mg/dL (ref 0.0–1.2)
CO2: 21 mmol/L (ref 20–29)
Calcium: 9.4 mg/dL (ref 8.7–10.2)
Chloride: 104 mmol/L (ref 96–106)
Creatinine, Ser: 1.17 mg/dL — ABNORMAL HIGH (ref 0.57–1.00)
Globulin, Total: 2.7 g/dL (ref 1.5–4.5)
Glucose: 110 mg/dL — ABNORMAL HIGH (ref 70–99)
Potassium: 3.7 mmol/L (ref 3.5–5.2)
Sodium: 142 mmol/L (ref 134–144)
Total Protein: 7.1 g/dL (ref 6.0–8.5)
eGFR: 54 mL/min/{1.73_m2} — ABNORMAL LOW (ref >59)

## 2023-07-25 LAB — CBC WITH DIFFERENTIAL/PLATELET
Basophils Absolute: 0.1 10*3/uL (ref 0.0–0.2)
Basos: 1 %
EOS (ABSOLUTE): 0.2 10*3/uL (ref 0.0–0.4)
Eos: 2 %
Hematocrit: 45.2 % (ref 34.0–46.6)
Hemoglobin: 14.6 g/dL (ref 11.1–15.9)
Immature Grans (Abs): 0 10*3/uL (ref 0.0–0.1)
Immature Granulocytes: 0 %
Lymphocytes Absolute: 4.1 10*3/uL — ABNORMAL HIGH (ref 0.7–3.1)
Lymphs: 44 %
MCH: 29.5 pg (ref 26.6–33.0)
MCHC: 32.3 g/dL (ref 31.5–35.7)
MCV: 91 fL (ref 79–97)
Monocytes Absolute: 0.6 10*3/uL (ref 0.1–0.9)
Monocytes: 6 %
Neutrophils Absolute: 4.3 10*3/uL (ref 1.4–7.0)
Neutrophils: 47 %
Platelets: 330 10*3/uL (ref 150–450)
RBC: 4.95 x10E6/uL (ref 3.77–5.28)
RDW: 13.3 % (ref 11.7–15.4)
WBC: 9.3 10*3/uL (ref 3.4–10.8)

## 2023-07-25 LAB — TSH: TSH: 1.53 u[IU]/mL (ref 0.450–4.500)

## 2023-07-25 LAB — MICROALBUMIN / CREATININE URINE RATIO
Creatinine, Urine: 185 mg/dL
Microalb/Creat Ratio: 14 mg/g{creat} (ref 0–29)
Microalbumin, Urine: 26 ug/mL

## 2023-07-31 DIAGNOSIS — R208 Other disturbances of skin sensation: Secondary | ICD-10-CM | POA: Diagnosis not present

## 2023-07-31 DIAGNOSIS — L538 Other specified erythematous conditions: Secondary | ICD-10-CM | POA: Diagnosis not present

## 2023-07-31 DIAGNOSIS — L503 Dermatographic urticaria: Secondary | ICD-10-CM | POA: Diagnosis not present

## 2023-07-31 DIAGNOSIS — L814 Other melanin hyperpigmentation: Secondary | ICD-10-CM | POA: Diagnosis not present

## 2023-07-31 DIAGNOSIS — Z08 Encounter for follow-up examination after completed treatment for malignant neoplasm: Secondary | ICD-10-CM | POA: Diagnosis not present

## 2023-07-31 DIAGNOSIS — L570 Actinic keratosis: Secondary | ICD-10-CM | POA: Diagnosis not present

## 2023-07-31 DIAGNOSIS — L820 Inflamed seborrheic keratosis: Secondary | ICD-10-CM | POA: Diagnosis not present

## 2023-07-31 DIAGNOSIS — Z85828 Personal history of other malignant neoplasm of skin: Secondary | ICD-10-CM | POA: Diagnosis not present

## 2023-08-20 ENCOUNTER — Other Ambulatory Visit: Payer: Self-pay | Admitting: Family

## 2023-08-20 DIAGNOSIS — E1169 Type 2 diabetes mellitus with other specified complication: Secondary | ICD-10-CM

## 2023-08-20 DIAGNOSIS — M109 Gout, unspecified: Secondary | ICD-10-CM

## 2023-08-26 ENCOUNTER — Ambulatory Visit: Payer: BC Managed Care – PPO

## 2023-08-30 ENCOUNTER — Other Ambulatory Visit: Payer: Self-pay | Admitting: Family

## 2023-10-06 ENCOUNTER — Encounter: Payer: Self-pay | Admitting: Family

## 2023-10-06 ENCOUNTER — Ambulatory Visit: Payer: BC Managed Care – PPO | Admitting: Family

## 2023-10-06 VITALS — BP 144/90 | HR 88 | Temp 97.9°F | Ht 63.0 in | Wt 227.0 lb

## 2023-10-06 DIAGNOSIS — R059 Cough, unspecified: Secondary | ICD-10-CM

## 2023-10-06 DIAGNOSIS — H66002 Acute suppurative otitis media without spontaneous rupture of ear drum, left ear: Secondary | ICD-10-CM

## 2023-10-06 DIAGNOSIS — J209 Acute bronchitis, unspecified: Secondary | ICD-10-CM | POA: Diagnosis not present

## 2023-10-06 LAB — VERITOR FLU A/B WAIVED
Influenza A: NEGATIVE
Influenza B: NEGATIVE

## 2023-10-06 LAB — RSV AG, IMMUNOCHR, WAIVED: RSV Ag, Immunochr, Waived: NEGATIVE

## 2023-10-06 MED ORDER — AMOXICILLIN-POT CLAVULANATE 875-125 MG PO TABS
1.0000 | ORAL_TABLET | Freq: Two times a day (BID) | ORAL | 0 refills | Status: DC
Start: 2023-10-06 — End: 2023-11-25

## 2023-10-06 MED ORDER — BENZONATATE 200 MG PO CAPS
200.0000 mg | ORAL_CAPSULE | Freq: Three times a day (TID) | ORAL | 1 refills | Status: DC | PRN
Start: 2023-10-06 — End: 2023-11-25

## 2023-10-06 MED ORDER — PREDNISONE 10 MG (21) PO TBPK
ORAL_TABLET | ORAL | 0 refills | Status: DC
Start: 2023-10-06 — End: 2023-11-25

## 2023-10-06 NOTE — Progress Notes (Signed)
Subjective:    Patient ID: Holly Rodriguez, female    DOB: 07-14-1966, 57 y.o.   MRN: [id number]  Chief Complaint  Patient presents with   Cough   Fever    Cough This is a new problem. The current episode started in the past 7 days. The problem has been gradually worsening. The problem occurs every few minutes. The cough is Non-productive. Associated symptoms include chills, ear congestion, ear pain, a fever, nasal congestion, a sore throat, shortness of breath and wheezing. Pertinent negatives include no myalgias. She has tried rest and OTC cough suppressant for the symptoms. The treatment provided mild relief.  Fever  Associated symptoms include coughing, ear pain, a sore throat and wheezing.  Diabetes She presents for her follow-up diabetic visit. She has type 2 diabetes mellitus. Risk factors for coronary artery disease include dyslipidemia, diabetes mellitus, hypertension and sedentary lifestyle. Her overall blood glucose range is 90-110 mg/dl.      Review of Systems  Constitutional:  Positive for chills and fever.  HENT:  Positive for ear pain and sore throat.   Respiratory:  Positive for cough, shortness of breath and wheezing.   Musculoskeletal:  Negative for myalgias.  All other systems reviewed and are negative.      Objective:   Physical Exam Vitals reviewed.  Constitutional:      General: She is not in acute distress.    Appearance: She is well-developed.  HENT:     Head: Normocephalic and atraumatic.     Right Ear: A middle ear effusion is present. Tympanic membrane is erythematous.     Left Ear: A middle ear effusion is present.  Eyes:     Pupils: Pupils are equal, round, and reactive to light.  Neck:     Thyroid: No thyromegaly.  Cardiovascular:     Rate and Rhythm: Normal rate and regular rhythm.     Heart sounds: Normal heart sounds. No murmur heard. Pulmonary:     Effort: Pulmonary effort is normal. No respiratory distress.     Breath sounds:  Normal breath sounds. No wheezing.  Abdominal:     General: Bowel sounds are normal. There is no distension.     Palpations: Abdomen is soft.     Tenderness: There is no abdominal tenderness.  Musculoskeletal:        General: No tenderness. Normal range of motion.     Cervical back: Normal range of motion and neck supple.  Skin:    General: Skin is warm and dry.  Neurological:     Mental Status: She is alert and oriented to person, place, and time.     Cranial Nerves: No cranial nerve deficit.     Deep Tendon Reflexes: Reflexes are normal and symmetric.  Psychiatric:        Behavior: Behavior normal.        Thought Content: Thought content normal.        Judgment: Judgment normal.      BP (!) 144/90   Pulse 88   Temp 97.9 F (36.6 C) (Temporal)   Ht 5\' 3"  (1.6 m)   Wt 227 lb (103 kg)   LMP 06/29/2017   SpO2 96%   BMI 40.21 kg/m      Assessment & Plan:  Holly Rodriguez comes in today with chief complaint of Cough and Fever   Diagnosis and orders addressed:  1. Cough, unspecified type - Novel Coronavirus, NAA ([facility]) - RSV Ag, Immunochr, Waived - Veritor Flu  A/B Waived - predniSONE (STERAPRED UNI-PAK 21 TAB) 10 MG (21) TBPK tablet; Use as directed  Dispense: 21 tablet; Refill: 0 - benzonatate (TESSALON) 200 MG capsule; Take 1 capsule (200 mg total) by mouth 3 (three) times daily as needed.  Dispense: 30 capsule; Refill: 1  2. Acute bronchitis, unspecified organism - Take meds as prescribed - Use a cool mist humidifier  -Use saline nose sprays frequently -Force fluids -For any cough or congestion  Use plain Mucinex- regular strength or max strength is fine -For fever or aces or pains- take tylenol or ibuprofen. -Throat lozenges if help -Follow up if symptoms worsen or do not improve  - predniSONE (STERAPRED UNI-PAK 21 TAB) 10 MG (21) TBPK tablet; Use as directed  Dispense: 21 tablet; Refill: 0 - benzonatate (TESSALON) 200 MG capsule; Take 1 capsule (200 mg  total) by mouth 3 (three) times daily as needed.  Dispense: 30 capsule; Refill: 1  3. Non-recurrent acute suppurative otitis media of left ear without spontaneous rupture of tympanic membrane Start Augmentin  - amoxicillin-clavulanate (AUGMENTIN) 875-125 MG tablet; Take 1 tablet by mouth 2 (two) times daily.  Dispense: 14 tablet; Refill: 0   Jannifer Rodney, FNP

## 2023-10-06 NOTE — Patient Instructions (Signed)

## 2023-10-07 LAB — NOVEL CORONAVIRUS, NAA: SARS-CoV-2, NAA: NOT DETECTED

## 2023-10-27 ENCOUNTER — Ambulatory Visit: Payer: BC Managed Care – PPO | Admitting: Family

## 2023-11-17 ENCOUNTER — Telehealth: Payer: Self-pay | Admitting: Family

## 2023-11-17 DIAGNOSIS — I152 Hypertension secondary to endocrine disorders: Secondary | ICD-10-CM

## 2023-11-17 DIAGNOSIS — E1165 Type 2 diabetes mellitus with hyperglycemia: Secondary | ICD-10-CM

## 2023-11-17 NOTE — Telephone Encounter (Signed)
A1C and CMP ordered

## 2023-11-17 NOTE — Telephone Encounter (Signed)
Pt aware lab appt made

## 2023-11-18 ENCOUNTER — Other Ambulatory Visit: Payer: BC Managed Care – PPO

## 2023-11-18 DIAGNOSIS — E1165 Type 2 diabetes mellitus with hyperglycemia: Secondary | ICD-10-CM | POA: Diagnosis not present

## 2023-11-18 DIAGNOSIS — I152 Hypertension secondary to endocrine disorders: Secondary | ICD-10-CM | POA: Diagnosis not present

## 2023-11-18 DIAGNOSIS — E1159 Type 2 diabetes mellitus with other circulatory complications: Secondary | ICD-10-CM | POA: Diagnosis not present

## 2023-11-18 LAB — BAYER DCA HB A1C WAIVED: HB A1C (BAYER DCA - WAIVED): 5.9 % — ABNORMAL HIGH (ref 4.8–5.6)

## 2023-11-19 LAB — CMP14+EGFR
ALT: 21 [IU]/L (ref 0–32)
AST: 20 [IU]/L (ref 0–40)
Albumin: 3.8 g/dL (ref 3.8–4.9)
Alkaline Phosphatase: 109 [IU]/L (ref 44–121)
BUN/Creatinine Ratio: 18 (ref 9–23)
BUN: 16 mg/dL (ref 6–24)
Bilirubin Total: 0.2 mg/dL (ref 0.0–1.2)
CO2: 22 mmol/L (ref 20–29)
Calcium: 9 mg/dL (ref 8.7–10.2)
Chloride: 108 mmol/L — ABNORMAL HIGH (ref 96–106)
Creatinine, Ser: 0.87 mg/dL (ref 0.57–1.00)
Globulin, Total: 2.5 g/dL (ref 1.5–4.5)
Glucose: 96 mg/dL (ref 70–99)
Potassium: 4.3 mmol/L (ref 3.5–5.2)
Sodium: 145 mmol/L — ABNORMAL HIGH (ref 134–144)
Total Protein: 6.3 g/dL (ref 6.0–8.5)
eGFR: 78 mL/min/{1.73_m2} (ref >59)

## 2023-11-25 ENCOUNTER — Encounter: Payer: Self-pay | Admitting: Family

## 2023-11-25 ENCOUNTER — Telehealth: Payer: BC Managed Care – PPO | Admitting: Family

## 2023-11-25 DIAGNOSIS — N1832 Chronic kidney disease, stage 3b: Secondary | ICD-10-CM

## 2023-11-25 DIAGNOSIS — E039 Hypothyroidism, unspecified: Secondary | ICD-10-CM | POA: Diagnosis not present

## 2023-11-25 DIAGNOSIS — I152 Hypertension secondary to endocrine disorders: Secondary | ICD-10-CM

## 2023-11-25 DIAGNOSIS — M791 Myalgia, unspecified site: Secondary | ICD-10-CM

## 2023-11-25 DIAGNOSIS — E1169 Type 2 diabetes mellitus with other specified complication: Secondary | ICD-10-CM

## 2023-11-25 DIAGNOSIS — F411 Generalized anxiety disorder: Secondary | ICD-10-CM

## 2023-11-25 DIAGNOSIS — E1122 Type 2 diabetes mellitus with diabetic chronic kidney disease: Secondary | ICD-10-CM

## 2023-11-25 DIAGNOSIS — E1165 Type 2 diabetes mellitus with hyperglycemia: Secondary | ICD-10-CM

## 2023-11-25 DIAGNOSIS — E785 Hyperlipidemia, unspecified: Secondary | ICD-10-CM

## 2023-11-25 DIAGNOSIS — E1159 Type 2 diabetes mellitus with other circulatory complications: Secondary | ICD-10-CM | POA: Diagnosis not present

## 2023-11-25 DIAGNOSIS — G4733 Obstructive sleep apnea (adult) (pediatric): Secondary | ICD-10-CM

## 2023-11-25 DIAGNOSIS — M109 Gout, unspecified: Secondary | ICD-10-CM

## 2023-11-25 MED ORDER — OMEPRAZOLE 40 MG PO CPDR: 40.0000 mg | DELAYED_RELEASE_CAPSULE | Freq: Every day | ORAL | 0 refills | Status: DC

## 2023-11-25 MED ORDER — ALLOPURINOL 300 MG PO TABS: ORAL_TABLET | ORAL | 0 refills | Status: DC

## 2023-11-25 MED ORDER — METOPROLOL SUCCINATE ER 25 MG PO TB24: 25.0000 mg | ORAL_TABLET | Freq: Every day | ORAL | 0 refills | Status: DC

## 2023-11-25 MED ORDER — TIRZEPATIDE 12.5 MG/0.5ML ~~LOC~~ SOAJ: 12.5000 mg | SUBCUTANEOUS | 2 refills | Status: DC

## 2023-11-25 MED ORDER — SYNJARDY 12.5-1000 MG PO TABS: 1.0000 | ORAL_TABLET | Freq: Two times a day (BID) | ORAL | 1 refills | Status: DC

## 2023-11-25 MED ORDER — LOSARTAN POTASSIUM 50 MG PO TABS: 50.0000 mg | ORAL_TABLET | Freq: Every day | ORAL | 0 refills | Status: DC

## 2023-11-25 MED ORDER — FENOFIBRATE 160 MG PO TABS: 160.0000 mg | ORAL_TABLET | Freq: Every day | ORAL | 0 refills | Status: DC

## 2023-11-25 NOTE — Progress Notes (Signed)
 Virtual Visit Consent   Holly Rodriguez, you are scheduled for a virtual visit with a [facility] provider today. Just as with appointments in the office, your consent must be obtained to participate. Your consent will be active for this visit and any virtual visit you may have with one of our providers in the next 365 days. If you have a MyChart account, a copy of this consent can be sent to you electronically.  As this is a virtual visit, video technology does not allow for your provider to perform a traditional examination. This may limit your provider's ability to fully assess your condition. If your provider identifies any concerns that need to be evaluated in person or the need to arrange testing (such as labs, EKG, etc.), we will make arrangements to do so. Although advances in technology are sophisticated, we cannot ensure that it will always work on either your end or our end. If the connection with a video visit is poor, the visit may have to be switched to a telephone visit. With either a video or telephone visit, we are not always able to ensure that we have a secure connection.  By engaging in this virtual visit, you consent to the provision of healthcare and authorize for your insurance to be billed (if applicable) for the services provided during this visit. Depending on your insurance coverage, you may receive a charge related to this service.  I need to obtain your verbal consent now. Are you willing to proceed with your visit today? Holly Rodriguez has provided verbal consent on 11/25/2023 for a virtual visit (video or telephone). Bari Learn, FNP  Date: 11/25/2023 3:12 PM  Virtual Visit via Video Note   I, Bari Learn, connected with  Holly Rodriguez  ([id number], 12/31/1965) on 11/25/23 at  3:25 PM EST by a video-enabled telemedicine application and verified that I am speaking with the correct person using two identifiers.  Location: Patient: Virtual Visit Location  Patient: work Provider: Pharmacist, Community: Home Office   I discussed the limitations of evaluation and management by telemedicine and the availability of in person appointments. The patient expressed understanding and agreed to proceed.    History of Present Illness: Holly Rodriguez is a 58 y.o. who identifies as a female who was assigned female at birth, and is being seen today for chronic follow up.   She has OSA and using CPAP nightly.   She has CKD and avoids NSAID's.   She has myalgia related to statins and can not tolerate statins.   Has hx of gout. Taking allopurinol  300 mg daily. Doing well.  HPI: Hypertension This is a chronic problem. The problem has been resolved since onset. The problem is controlled. Associated symptoms include anxiety. Pertinent negatives include no blurred vision, malaise/fatigue, peripheral edema or shortness of breath. Risk factors for coronary artery disease include dyslipidemia, diabetes mellitus, obesity and sedentary lifestyle. The current treatment provides moderate improvement. Identifiable causes of hypertension include a thyroid  problem.  Diabetes She presents for her follow-up diabetic visit. She has type 2 diabetes mellitus. Hypoglycemia symptoms include nervousness/anxiousness. Associated symptoms include foot paresthesias. Pertinent negatives for diabetes include no blurred vision and no fatigue. Symptoms are stable. Risk factors for coronary artery disease include dyslipidemia, diabetes mellitus, hypertension, sedentary lifestyle and post-menopausal. She is following a generally healthy diet. Her overall blood glucose range is 90-110 mg/dl.  Hyperlipidemia This is a chronic problem. The current episode started more than 1 year ago.  The problem is uncontrolled. Recent lipid tests were reviewed and are high. Exacerbating diseases include diabetes and hypothyroidism. Pertinent negatives include no shortness of breath. Current  antihyperlipidemic treatment includes diet change. The current treatment provides mild improvement of lipids. Risk factors for coronary artery disease include dyslipidemia, diabetes mellitus, hypertension and a sedentary lifestyle.  Thyroid  Problem Presents for follow-up visit. Symptoms include anxiety and dry skin. Patient reports no depressed mood, diaphoresis or fatigue. Her past medical history is significant for diabetes and hyperlipidemia.  Anxiety Presents for follow-up visit. Symptoms include excessive worry, nervous/anxious behavior and restlessness. Patient reports no depressed mood or shortness of breath. Symptoms occur occasionally. The severity of symptoms is mild.      Problems:  Patient Active Problem List   Diagnosis Date Noted   Myalgia due to statin 06/27/2022   Precordial chest pain 10/22/2021   Stage 3b chronic kidney disease (HCC) 06/26/2020   Gout 06/22/2020   GAD (generalized anxiety disorder) 06/22/2020   OSA (obstructive sleep apnea) 06/22/2020   Controlled substance agreement signed 06/22/2020   Vitamin D  deficiency 06/22/2020   Hyperlipidemia associated with type 2 diabetes mellitus (HCC) 02/03/2020   CKD stage 3 due to type 2 diabetes mellitus (HCC) 05/08/2019   Recurrent herpes simplex 09/22/2017   Type 2 diabetes mellitus with hyperglycemia, without long-term current use of insulin  (HCC) 09/22/2017   Hypothyroidism 09/13/2017   Hypertriglyceridemia 03/22/2016   Grief at loss of child 02/03/2016   Morbid obesity (HCC) 11/15/2014   Colon polyps 08/03/2014   Hemorrhoids, external 08/03/2014   Prolapsed internal hemorrhoids, grade 1 08/03/2014   Iron  deficiency 06/22/2014   Hypertension associated with diabetes (HCC) 12/06/2013    Allergies:  Allergies  Allergen Reactions   Statins Other (See Comments)    Intolerance per patient, legs hurt   Medications:  Current Outpatient Medications:    tirzepatide  (MOUNJARO ) 12.5 MG/0.5ML Pen, Inject 12.5 mg into  the skin once a week., Disp: 6 mL, Rfl: 2   allopurinol  (ZYLOPRIM ) 300 MG tablet, TAKE 1 TABLET BY MOUTH  DAILY, Disp: 90 tablet, Rfl: 0   Empagliflozin -metFORMIN  HCl (SYNJARDY ) 12.03-999 MG TABS, Take 1 tablet by mouth 2 (two) times daily., Disp: 180 tablet, Rfl: 1   fenofibrate  160 MG tablet, Take 1 tablet (160 mg total) by mouth daily., Disp: 90 tablet, Rfl: 0   glucose blood test strip, Reli-on glucometer.  Test Blood sugar qid Dx. 250.01, Disp: , Rfl:    Lancets (ONETOUCH ULTRASOFT) lancets, Test 1X per day and prn   Dx 250.01, Disp: 100 each, Rfl: 12   levothyroxine  (SYNTHROID ) 75 MCG tablet, TAKE 1 TABLET BY MOUTH DAILY  BEFORE BREAKFAST, Disp: 90 tablet, Rfl: 2   losartan  (COZAAR ) 50 MG tablet, Take 1 tablet (50 mg total) by mouth daily., Disp: 90 tablet, Rfl: 0   metoprolol  succinate (TOPROL -XL) 25 MG 24 hr tablet, Take 1 tablet (25 mg total) by mouth daily., Disp: 90 tablet, Rfl: 0   nystatin  (NYAMYC ) powder, APPLY 1 APPLICATION TOPICALLY 3  TIMES DAILY, Disp: 90 g, Rfl: 1   Omega-3 Fatty Acids (OMEGA 3 PO), Take 4 capsules by mouth daily., Disp: , Rfl:    omeprazole  (PRILOSEC) 40 MG capsule, Take 1 capsule (40 mg total) by mouth daily., Disp: 90 capsule, Rfl: 0  Observations/Objective: Patient is well-developed, well-nourished in no acute distress.  Resting comfortably Head is normocephalic, atraumatic.  No labored breathing.  Speech is clear and coherent with logical content.  Patient is alert and oriented at  baseline.    Assessment and Plan: 1. Hypertension associated with diabetes (HCC) (Primary) - losartan  (COZAAR ) 50 MG tablet; Take 1 tablet (50 mg total) by mouth daily.  Dispense: 90 tablet; Refill: 0 - metoprolol  succinate (TOPROL -XL) 25 MG 24 hr tablet; Take 1 tablet (25 mg total) by mouth daily.  Dispense: 90 tablet; Refill: 0  2. Morbid obesity (HCC)  3. Hypothyroidism, unspecified type  4. Type 2 diabetes mellitus with hyperglycemia, without long-term current use  of insulin  (HCC) - Empagliflozin -metFORMIN  HCl (SYNJARDY ) 12.03-999 MG TABS; Take 1 tablet by mouth 2 (two) times daily.  Dispense: 180 tablet; Refill: 1 - tirzepatide  (MOUNJARO ) 12.5 MG/0.5ML Pen; Inject 12.5 mg into the skin once a week.  Dispense: 6 mL; Refill: 2  5. CKD stage 3 due to type 2 diabetes mellitus (HCC)  6. Hyperlipidemia associated with type 2 diabetes mellitus (HCC) - fenofibrate  160 MG tablet; Take 1 tablet (160 mg total) by mouth daily.  Dispense: 90 tablet; Refill: 0  7. GAD (generalized anxiety disorder)  8. OSA (obstructive sleep apnea)  9. Stage 3b chronic kidney disease (HCC)  10. Myalgia due to statin  11. Acute gout, unspecified cause, unspecified site - allopurinol  (ZYLOPRIM ) 300 MG tablet; TAKE 1 TABLET BY MOUTH  DAILY  Dispense: 90 tablet; Refill: 0  Continue current medications  Will increase Mounjaro  to 12.5 mg from 10 mg  Low carb diet  Encourage healthy diet and exercise Follow up in 3 months   Follow Up Instructions: I discussed the assessment and treatment plan with the patient. The patient was provided an opportunity to ask questions and all were answered. The patient agreed with the plan and demonstrated an understanding of the instructions.  A copy of instructions were sent to the patient via MyChart unless otherwise noted below.     The patient was advised to call back or seek an in-person evaluation if the symptoms worsen or if the condition fails to improve as anticipated.    Bari Learn, FNP

## 2023-11-28 ENCOUNTER — Other Ambulatory Visit: Payer: Self-pay | Admitting: Family

## 2023-11-28 DIAGNOSIS — E1159 Type 2 diabetes mellitus with other circulatory complications: Secondary | ICD-10-CM

## 2023-12-08 ENCOUNTER — Other Ambulatory Visit (HOSPITAL_COMMUNITY): Payer: Self-pay

## 2023-12-08 ENCOUNTER — Encounter (HOSPITAL_COMMUNITY): Payer: Self-pay | Admitting: Hematology and Oncology

## 2023-12-08 ENCOUNTER — Telehealth: Payer: Self-pay

## 2023-12-08 NOTE — Telephone Encounter (Signed)
Pharmacy Patient Advocate Encounter   Received notification from Pt Calls Messages that prior authorization for Mounjaro 12.5MG /0.5ML auto-injectors is required/requested.   Insurance verification completed.   The patient is insured through [employer] .   Per test claim: PA required; PA submitted to above mentioned insurance via CoverMyMeds Key/confirmation #/EOC [id number] Status is pending

## 2023-12-16 NOTE — Telephone Encounter (Signed)
Copied from CRM [id number]. Topic: Clinical - Prescription Issue >> Dec 16, 2023  1:38 PM Gildardo Pounds wrote: Reason for CRM: Patient has been without medication for diabetes for over a month. The tirzepatide [facility]) 12.5 MG/0.5ML Pen has been denied because they do not have a diagnosis of the diabetes. Callback number is [phone]

## 2023-12-17 ENCOUNTER — Telehealth: Payer: Self-pay | Admitting: Family

## 2023-12-17 NOTE — Telephone Encounter (Signed)
Patient has sent several messages and has not hear anything regarding her Mounjaro.  Has been out of medication for a month.  Could someone please call and let her know what to do.  Prior Berkley Harvey was denied because it did not state patient had diabetes.  Patient DOES have diabetes and company stated they could be called to correct this so it would be approved.

## 2023-12-18 ENCOUNTER — Telehealth: Payer: Self-pay | Admitting: Pharmacist

## 2023-12-18 ENCOUNTER — Other Ambulatory Visit (HOSPITAL_COMMUNITY): Payer: Self-pay

## 2023-12-18 NOTE — Telephone Encounter (Signed)
Appeal has been submitted for [facility].  Will advise when response is received, please be advised that most companies may take 30 days to make a decision.  Completed appeal form and supporting documentation have been faxed to [phone] on 12/18/2023 @12 :28 pm.   Thank you, Dellie Burns, PharmD Clinical Pharmacist  [facility]  Direct Dial: [phone]

## 2023-12-18 NOTE — Telephone Encounter (Signed)
Patient aware and verbalized understanding.

## 2023-12-18 NOTE — Telephone Encounter (Signed)
Information has been sent to clinical pharmacist for appeals review. It may take 5-7 days to prepare the necessary documentation to request the appeal from the insurance.

## 2023-12-19 ENCOUNTER — Other Ambulatory Visit: Payer: Self-pay | Admitting: Family

## 2023-12-19 DIAGNOSIS — E1165 Type 2 diabetes mellitus with hyperglycemia: Secondary | ICD-10-CM

## 2023-12-19 MED ORDER — TIRZEPATIDE 12.5 MG/0.5ML ~~LOC~~ SOAJ: 12.5000 mg | SUBCUTANEOUS | 2 refills | Status: DC

## 2023-12-19 NOTE — Telephone Encounter (Signed)
I have resent your Moujaro in with is associated with diabetes.

## 2023-12-19 NOTE — Telephone Encounter (Signed)
Pt aware and will check with pharmacy again.

## 2023-12-19 NOTE — Telephone Encounter (Signed)
Holly Rodriguez can you look at this. I have sent the [facility] but insurance denied, but she has been getting medication. She has Type 2 diabetes, but her last A1C was prediabetic because of the medication.

## 2023-12-22 NOTE — Telephone Encounter (Signed)
Pt is calling to get on rx.

## 2023-12-22 NOTE — Telephone Encounter (Signed)
Called and spoke with patient she states she talked to her insurance Company who told her that they have received nothing from [place] regarding an appeal. Patient has been out for a month and is getting frustrated.

## 2023-12-22 NOTE — Telephone Encounter (Signed)
Appeal has been submitted. Status is pending.

## 2023-12-23 ENCOUNTER — Other Ambulatory Visit: Payer: Self-pay | Admitting: Family

## 2023-12-23 ENCOUNTER — Telehealth: Payer: Self-pay | Admitting: Pharmacist

## 2023-12-23 ENCOUNTER — Other Ambulatory Visit (HOSPITAL_COMMUNITY): Payer: Self-pay

## 2023-12-23 DIAGNOSIS — M109 Gout, unspecified: Secondary | ICD-10-CM

## 2023-12-23 NOTE — Telephone Encounter (Signed)
Called [employer] at [phone] to check status. Per representative, appeal has been received and is in progress.

## 2023-12-23 NOTE — Telephone Encounter (Signed)
 Patient aware and verbalized understanding.

## 2023-12-23 NOTE — Telephone Encounter (Signed)
 Contacted [employer] today to confirm they did received the faxed appeal.  It was received on 12/18/2023 at 12:26 pm.  The appeal is being reviewed and hope to have an answer within the next 72 hours.  They will fax a letter of determination, if I do not hear back within the 72 hours, I will follow up.  The number I reached out to is [phone].  Thank you, Devere Pandy, PharmD Clinical Pharmacist  [facility]  Direct Dial: [phone]

## 2023-12-30 ENCOUNTER — Ambulatory Visit
Admission: RE | Admit: 2023-12-30 | Discharge: 2023-12-30 | Disposition: A | Discharge status: Home / Self Care | Payer: BC Managed Care – PPO | Source: Ambulatory Visit | Attending: Obstetrics and Gynecology | Admitting: Obstetrics and Gynecology

## 2023-12-30 DIAGNOSIS — E2839 Other primary ovarian failure: Secondary | ICD-10-CM | POA: Diagnosis not present

## 2023-12-30 DIAGNOSIS — Z780 Asymptomatic menopausal state: Secondary | ICD-10-CM

## 2023-12-30 DIAGNOSIS — N958 Other specified menopausal and perimenopausal disorders: Secondary | ICD-10-CM | POA: Diagnosis not present

## 2024-01-02 NOTE — Telephone Encounter (Signed)
Called and spoke with patient she has gotten filled finally.

## 2024-01-02 NOTE — Telephone Encounter (Signed)
Do you know if the patient ever got her Mounjaro filled?

## 2024-01-06 ENCOUNTER — Ambulatory Visit: Payer: Self-pay | Admitting: Family

## 2024-01-06 DIAGNOSIS — R002 Palpitations: Secondary | ICD-10-CM | POA: Diagnosis not present

## 2024-01-06 DIAGNOSIS — N644 Mastodynia: Secondary | ICD-10-CM | POA: Diagnosis not present

## 2024-01-06 NOTE — Telephone Encounter (Signed)
Copied from CRM [id number]. Topic: Clinical - Red Word Triage >> Jan 06, 2024  3:32 PM Phill Myron wrote: Red Word that prompted transfer to Nurse Triage:  chest discomfort, heart flutter, uneasy feeling   Chief Complaint: Chest pain Symptoms: Left sided chest pain, left arm/shoulder pain, heart flutters Frequency: Constant pain Disposition: [x] ED /[] Urgent Care (no appt availability in office) / [] Appointment(In office/virtual)/ []  [facility]/ [] Home Care/ [] Refused Recommended Disposition /[] [facility]/ []  Follow-up with PCP Additional Notes: Patient reports that she has been experiencing left sided chest pain for the last 2-3 days. She states that her pain is 5/10 and has been constant. She states she is also experiencing some pain in her left shoulder and arm, and occasionally experiences a heart fluttering feeling. Patient advised that with her symptoms she should go to the ED for evaluation. Patient verbalized understanding and agreement with this plan.     Reason for Disposition  Pain also in shoulder(s) or arm(s) or jaw  (Exception: Pain is clearly made worse by movement.)  Answer Assessment - Initial Assessment Questions 1. LOCATION: "Where does it hurt?"       Under left breast  2. RADIATION: "Does the pain go anywhere else?" (e.g., into neck, jaw, arms, back)     Left arm and shoulder  3. ONSET: "When did the chest pain begin?" (Minutes, hours or days)      2-3 days 4. PATTERN: "Does the pain come and go, or has it been constant since it started?"  "Does it get worse with exertion?"      Constant  5. DURATION: "How long does it last" (e.g., seconds, minutes, hours)     N/A 6. SEVERITY: "How bad is the pain?"  (e.g., Scale 1-10; mild, moderate, or severe)    - MILD (1-3): doesn't interfere with normal activities     - MODERATE (4-7): interferes with normal activities or awakens from sleep    - SEVERE (8-10): excruciating pain, unable to do  any normal activities       5/10 7. CARDIAC RISK FACTORS: "Do you have any history of heart problems or risk factors for heart disease?" (e.g., angina, prior heart attack; diabetes, high blood pressure, high cholesterol, smoker, or strong family history of heart disease)     Hypertension, diabetes  8. PULMONARY RISK FACTORS: "Do you have any history of lung disease?"  (e.g., blood clots in lung, asthma, emphysema, birth control pills)     No 9. CAUSE: "What do you think is causing the chest pain?"     Unsure  10. OTHER SYMPTOMS: "Do you have any other symptoms?" (e.g., dizziness, nausea, vomiting, sweating, fever, difficulty breathing, cough)       Heart fluttering, left shoulder and arm pain  11. PREGNANCY: "Is there any chance you are pregnant?" "When was your last menstrual period?"       No  Protocols used: Chest Pain-A-AH

## 2024-01-07 ENCOUNTER — Other Ambulatory Visit (HOSPITAL_COMMUNITY): Payer: Self-pay

## 2024-01-08 ENCOUNTER — Ambulatory Visit: Payer: Self-pay | Admitting: Family

## 2024-01-08 DIAGNOSIS — R059 Cough, unspecified: Secondary | ICD-10-CM | POA: Diagnosis not present

## 2024-01-08 DIAGNOSIS — R002 Palpitations: Secondary | ICD-10-CM | POA: Diagnosis not present

## 2024-01-08 DIAGNOSIS — Z20822 Contact with and (suspected) exposure to covid-19: Secondary | ICD-10-CM | POA: Diagnosis not present

## 2024-01-08 DIAGNOSIS — R519 Headache, unspecified: Secondary | ICD-10-CM | POA: Diagnosis not present

## 2024-01-08 DIAGNOSIS — R9431 Abnormal electrocardiogram [ECG] [EKG]: Secondary | ICD-10-CM | POA: Diagnosis not present

## 2024-01-08 DIAGNOSIS — J111 Influenza due to unidentified influenza virus with other respiratory manifestations: Secondary | ICD-10-CM | POA: Diagnosis not present

## 2024-01-08 DIAGNOSIS — R509 Fever, unspecified: Secondary | ICD-10-CM | POA: Diagnosis not present

## 2024-01-08 NOTE — Telephone Encounter (Signed)
  Chief Complaint: cough, left sided chest pain Symptoms: nasal congestion, runny nose, fever, productive cough with brown/green mucus, chills, palpitations, diarrhea Frequency: x 1-2 weeks Pertinent Negatives: Patient denies nausea, vomiting Disposition: [x] ED /[] Urgent Care (no appt availability in office) / [] Appointment(In office/virtual)/ []  [facility]/ [] Home Care/ [] Refused Recommended Disposition /[] [facility]/ []  Follow-up with PCP Additional Notes: Patient states she has been taking Muccinex for 2 weeks and symptoms returned and worsened. Patient states she has had the palpitations and left sided chest pain x 1 week. Patient triaged on 01/06/24 and advised to go to ED and states she did not. Patient today states she will go to ED.   Copied from CRM [id number]. Topic: Clinical - Red Word Triage >> Jan 08, 2024  8:21 AM Tiffany B wrote: Red Word that prompted transfer to Nurse Triage Caller experiencing chest tightness. Reason for Disposition  Chest pain  (Exception: MILD central chest pain, present only when coughing.)  Answer Assessment - Initial Assessment Questions 1. ONSET: "When did the cough begin?"      She states she had a "bunch of drainage" about 2 weeks ago and seemed improved on Muccinex, skipped a day, and then returned and worsened.  2. SEVERITY: "How bad is the cough today?"      6/10.  3. SPUTUM: "Describe the color of your sputum" (none, dry cough; clear, white, yellow, green)     Brown/green.  4. HEMOPTYSIS: "Are you coughing up any blood?" If so ask: "How much?" (flecks, streaks, tablespoons, etc.)     Denies.  5. DIFFICULTY BREATHING: "Are you having difficulty breathing?" If Yes, ask: "How bad is it?" (e.g., mild, moderate, severe)    - MILD: No SOB at rest, mild SOB with walking, speaks normally in sentences, can lie down, no retractions, pulse < 100.    - MODERATE: SOB at rest, SOB with minimal exertion and prefers to sit, cannot lie  down flat, speaks in phrases, mild retractions, audible wheezing, pulse 100-120.    - SEVERE: Very SOB at rest, speaks in single words, struggling to breathe, sitting hunched forward, retractions, pulse > 120      She states she feels winded when getting up and moving around.  6. FEVER: "Do you have a fever?" If Yes, ask: "What is your temperature, how was it measured, and when did it start?"     100 degrees last night, infrared thermometer.  7. CARDIAC HISTORY: "Do you have any history of heart disease?" (e.g., heart attack, congestive heart failure)      Denies.  8. LUNG HISTORY: "Do you have any history of lung disease?"  (e.g., pulmonary embolus, asthma, emphysema)     Denies.  9. PE RISK FACTORS: "Do you have a history of blood clots?" (or: recent major surgery, recent prolonged travel, bedridden)     Denies.  10. OTHER SYMPTOMS: "Do you have any other symptoms?" (e.g., runny nose, wheezing, chest pain)       Chills, palpitations "fluttering in chest", diarrhea once or twice a couple of days ago, runny nose, nasal congestion, left sided chest tenderness/pain x 1 week and worsening.  11. TRAVEL: "Have you traveled out of the country in the last month?" (e.g., travel history, exposures)       Denies travel. She states her grandson lives with them and he tested positive for the flu on Sunday night.  Protocols used: Cough - Acute Productive-A-AH

## 2024-01-15 ENCOUNTER — Other Ambulatory Visit (HOSPITAL_COMMUNITY): Payer: Self-pay

## 2024-01-16 ENCOUNTER — Encounter (INDEPENDENT_AMBULATORY_CARE_PROVIDER_SITE_OTHER): Payer: Self-pay

## 2024-01-16 DIAGNOSIS — Z7985 Long-term (current) use of injectable non-insulin antidiabetic drugs: Secondary | ICD-10-CM | POA: Diagnosis not present

## 2024-01-16 DIAGNOSIS — E1165 Type 2 diabetes mellitus with hyperglycemia: Secondary | ICD-10-CM | POA: Diagnosis not present

## 2024-01-16 MED ORDER — TIRZEPATIDE 10 MG/0.5ML ~~LOC~~ SOAJ
10.0000 mg | SUBCUTANEOUS | 2 refills | Status: DC
Start: 2024-01-16 — End: 2024-05-24

## 2024-01-16 MED ORDER — ONDANSETRON HCL 4 MG PO TABS: 4.0000 mg | ORAL_TABLET | Freq: Three times a day (TID) | ORAL | 0 refills | Status: DC | PRN

## 2024-01-16 NOTE — Telephone Encounter (Signed)
 Hello, Yes, we can decrease to Mounjaro 10 mg.  I have also sent in zofran to help with nausea.    Jannifer Rodney, FNP   Approximately 5 minutes was spent documenting and reviewing patient's chart.

## 2024-01-19 ENCOUNTER — Other Ambulatory Visit (HOSPITAL_COMMUNITY): Payer: Self-pay

## 2024-01-19 NOTE — Telephone Encounter (Signed)
 Appeal was approved on 01/07/2024 with an indefinite end date.

## 2024-03-26 ENCOUNTER — Telehealth: Admitting: Physician Assistant

## 2024-03-26 DIAGNOSIS — B9689 Other specified bacterial agents as the cause of diseases classified elsewhere: Secondary | ICD-10-CM

## 2024-03-26 DIAGNOSIS — J019 Acute sinusitis, unspecified: Secondary | ICD-10-CM | POA: Diagnosis not present

## 2024-03-26 MED ORDER — AMOXICILLIN-POT CLAVULANATE 875-125 MG PO TABS: 1.0000 | ORAL_TABLET | Freq: Two times a day (BID) | ORAL | 0 refills | Status: DC

## 2024-03-26 NOTE — Progress Notes (Signed)

## 2024-04-08 ENCOUNTER — Other Ambulatory Visit: Payer: Self-pay | Admitting: Family

## 2024-04-08 DIAGNOSIS — E1169 Type 2 diabetes mellitus with other specified complication: Secondary | ICD-10-CM

## 2024-04-08 DIAGNOSIS — M109 Gout, unspecified: Secondary | ICD-10-CM

## 2024-04-08 DIAGNOSIS — E1159 Type 2 diabetes mellitus with other circulatory complications: Secondary | ICD-10-CM

## 2024-05-04 ENCOUNTER — Other Ambulatory Visit: Payer: Self-pay | Admitting: Family

## 2024-05-04 DIAGNOSIS — E079 Disorder of thyroid, unspecified: Secondary | ICD-10-CM

## 2024-05-24 ENCOUNTER — Ambulatory Visit (INDEPENDENT_AMBULATORY_CARE_PROVIDER_SITE_OTHER)

## 2024-05-24 ENCOUNTER — Ambulatory Visit: Admitting: Family

## 2024-05-24 ENCOUNTER — Other Ambulatory Visit: Payer: Self-pay | Admitting: Family

## 2024-05-24 VITALS — BP 115/72 | HR 74 | Temp 97.0°F | Ht 63.0 in | Wt 223.0 lb

## 2024-05-24 DIAGNOSIS — E039 Hypothyroidism, unspecified: Secondary | ICD-10-CM

## 2024-05-24 DIAGNOSIS — Z0000 Encounter for general adult medical examination without abnormal findings: Secondary | ICD-10-CM | POA: Diagnosis not present

## 2024-05-24 DIAGNOSIS — E1159 Type 2 diabetes mellitus with other circulatory complications: Secondary | ICD-10-CM | POA: Diagnosis not present

## 2024-05-24 DIAGNOSIS — E1122 Type 2 diabetes mellitus with diabetic chronic kidney disease: Secondary | ICD-10-CM

## 2024-05-24 DIAGNOSIS — F411 Generalized anxiety disorder: Secondary | ICD-10-CM

## 2024-05-24 DIAGNOSIS — M109 Gout, unspecified: Secondary | ICD-10-CM

## 2024-05-24 DIAGNOSIS — E1165 Type 2 diabetes mellitus with hyperglycemia: Secondary | ICD-10-CM | POA: Diagnosis not present

## 2024-05-24 DIAGNOSIS — M25551 Pain in right hip: Secondary | ICD-10-CM | POA: Diagnosis not present

## 2024-05-24 DIAGNOSIS — E079 Disorder of thyroid, unspecified: Secondary | ICD-10-CM

## 2024-05-24 DIAGNOSIS — E1169 Type 2 diabetes mellitus with other specified complication: Secondary | ICD-10-CM

## 2024-05-24 DIAGNOSIS — T466X5A Adverse effect of antihyperlipidemic and antiarteriosclerotic drugs, initial encounter: Secondary | ICD-10-CM

## 2024-05-24 DIAGNOSIS — Z0001 Encounter for general adult medical examination with abnormal findings: Secondary | ICD-10-CM | POA: Diagnosis not present

## 2024-05-24 DIAGNOSIS — G4733 Obstructive sleep apnea (adult) (pediatric): Secondary | ICD-10-CM

## 2024-05-24 DIAGNOSIS — I152 Hypertension secondary to endocrine disorders: Secondary | ICD-10-CM | POA: Diagnosis not present

## 2024-05-24 DIAGNOSIS — M1611 Unilateral primary osteoarthritis, right hip: Secondary | ICD-10-CM | POA: Diagnosis not present

## 2024-05-24 DIAGNOSIS — E559 Vitamin D deficiency, unspecified: Secondary | ICD-10-CM

## 2024-05-24 DIAGNOSIS — N183 Chronic kidney disease, stage 3 unspecified: Secondary | ICD-10-CM | POA: Diagnosis not present

## 2024-05-24 DIAGNOSIS — M791 Myalgia, unspecified site: Secondary | ICD-10-CM

## 2024-05-24 LAB — BAYER DCA HB A1C WAIVED: HB A1C (BAYER DCA - WAIVED): 5.7 % — ABNORMAL HIGH (ref 4.8–5.6)

## 2024-05-24 MED ORDER — DICLOFENAC SODIUM 75 MG PO TBEC
75.0000 mg | DELAYED_RELEASE_TABLET | Freq: Two times a day (BID) | ORAL | 0 refills | Status: DC
Start: 2024-05-24 — End: 2024-05-25

## 2024-05-24 NOTE — Patient Instructions (Signed)
 Hip Pain The hip is the joint between the upper legs and the lower pelvis. The bones, cartilage, tendons, and muscles of your hip joint support your body and allow you to move around. Hip pain can range from a minor ache to severe pain in one or both of your hips. The pain may be felt on the inside of the hip joint near the groin, or on the outside near the buttocks and upper thigh. You may also have swelling or stiffness in your hip area. Follow these instructions at home: Managing pain, stiffness, and swelling     If told, put ice on the painful area. Put ice in a plastic bag. Place a towel between your skin and the bag. Leave the ice on for 20 minutes, 2-3 times a day. If told, apply heat to the affected area as often as told by your health care provider. Use the heat source that your provider recommends, such as a moist heat pack or a heating pad. Place a towel between your skin and the heat source. Leave the heat on for 20-30 minutes. If your skin turns bright red, remove the ice or heat right away to prevent skin damage. The risk of damage is higher if you cannot feel pain, heat, or cold. Activity Do exercises as told by your provider. Avoid activities that cause pain. General instructions  Take over-the-counter and prescription medicines only as told by your provider. Keep a journal of your symptoms. Write down: How often you have hip pain. The location of your pain. What the pain feels like. What makes the pain worse. Sleep with a pillow between your legs on your most comfortable side. Keep all follow-up visits. Your provider will monitor your pain and activity. Contact a health care provider if: You cannot put weight on your leg. Your pain or swelling gets worse after a week. It gets harder to walk. You have a fever. Get help right away if: You fall. You have a sudden increase in pain and swelling in your hip. Your hip is red or swollen or very tender to touch. This  information is not intended to replace advice given to you by your health care provider. Make sure you discuss any questions you have with your health care provider. Document Revised: 07/09/2022 Document Reviewed: 07/09/2022 Elsevier Patient Education  2024 [employer].

## 2024-05-24 NOTE — Progress Notes (Signed)
 Subjective:    Patient ID: Holly Rodriguez, female    DOB: 10-24-1966, 58 y.o.   MRN: [id number]  Chief Complaint  Patient presents with   Annual Exam   Pt presents to the office today for CPE and  DM.   She was seen on 01/24/23 and her Ozempic  2 mg and we changed to Moujaro 10 mg. She has lost 36 lbs since our last visit. Her starting weight was  259 lb.      05/24/2024    3:04 PM 10/06/2023   12:06 PM 07/24/2023    3:01 PM  Last 3 Weights  Weight (lbs) 223 lb 227 lb 227 lb 12.8 oz  Weight (kg) 101.152 kg 102.967 kg 103.329 kg    She has OSA and uses CPAP nightly.   She has CKD that is stable.   She can not tolerate statins because of myalgia.   She is morbid obese with a BMI of 39 with HTN and DM.  Diabetes She presents for her follow-up diabetic visit. She has type 2 diabetes mellitus. Pertinent negatives for hypoglycemia include no nervousness/anxiousness. Associated symptoms include fatigue and foot paresthesias. Pertinent negatives for diabetes include no blurred vision. Symptoms are stable. Diabetic complications include peripheral neuropathy. Risk factors for coronary artery disease include dyslipidemia, diabetes mellitus, hypertension, sedentary lifestyle and post-menopausal. She is following a generally healthy diet. (Not checking BS at home) Eye exam is current.  Hypertension This is a chronic problem. The current episode started more than 1 year ago. The problem has been resolved since onset. The problem is controlled. Associated symptoms include malaise/fatigue and peripheral edema. Pertinent negatives include no blurred vision or shortness of breath. Risk factors for coronary artery disease include dyslipidemia, diabetes mellitus, obesity and sedentary lifestyle. The current treatment provides moderate improvement. Identifiable causes of hypertension include a thyroid  problem.  Thyroid  Problem Presents for follow-up visit. Symptoms include constipation, dry skin and  fatigue. Patient reports no anxiety, diaphoresis or diarrhea. The symptoms have been stable.  Hyperlipidemia This is a chronic problem. The current episode started more than 1 year ago. Recent lipid tests were reviewed and are normal. Exacerbating diseases include obesity. Pertinent negatives include no shortness of breath. Current antihyperlipidemic treatment includes diet change. The current treatment provides no improvement of lipids. Risk factors for coronary artery disease include dyslipidemia, diabetes mellitus, hypertension, a sedentary lifestyle, post-menopausal and obesity.  Hip Pain  The incident occurred more than 1 week ago. There was no injury mechanism. The pain is present in the right hip. The quality of the pain is described as aching. The pain is at a severity of 5/10. The pain is mild. The pain has been Intermittent since onset. Pertinent negatives include no numbness or tingling. The symptoms are aggravated by movement and weight bearing. She has tried rest for the symptoms. The treatment provided mild relief.      Review of Systems  Constitutional:  Positive for fatigue and malaise/fatigue. Negative for diaphoresis.  Eyes:  Negative for blurred vision.  Respiratory:  Negative for shortness of breath.   Gastrointestinal:  Positive for constipation. Negative for diarrhea.  Neurological:  Negative for tingling and numbness.  Psychiatric/Behavioral:  The patient is not nervous/anxious.   All other systems reviewed and are negative.  Family History  Problem Relation Age of Onset   Arthritis Mother    Fibromyalgia Mother    Hyperlipidemia Mother    Arthritis Maternal Grandmother    Heart disease Maternal Grandmother  Alzheimer's disease Maternal Grandfather    Gout Daughter    Gout Son    Social History   Socioeconomic History   Marital status: Married    Spouse name: Not on file   Number of children: Not on file   Years of education: Not on file   Highest  education level: Associate degree: occupational, Scientist, product/process development, or vocational program  Occupational History   Not on file  Tobacco Use   Smoking status: Former    Current packs/day: 0.00    Types: Cigarettes    Start date: 11/18/2006    Quit date: 11/18/2008    Years since quitting: 15.5   Smokeless tobacco: Never   Tobacco comments:    quit smoking about 1o0 years ago but passive smoking exposure  Vaping Use   Vaping status: Never Used  Substance and Sexual Activity   Alcohol use: Yes    Comment: occasional   Drug use: No   Sexual activity: Not on file  Other Topics Concern   Not on file  Social History Narrative   Not on file   Social Drivers of Health   Financial Resource Strain: Medium Risk (05/24/2024)   Overall Financial Resource Strain (CARDIA)    Difficulty of Paying Living Expenses: Somewhat hard  Food Insecurity: Food Insecurity Present (05/24/2024)   Hunger Vital Sign    Worried About Running Out of Food in the Last Year: Sometimes true    Ran Out of Food in the Last Year: Sometimes true  Transportation Needs: No Transportation Needs (05/24/2024)   PRAPARE - Administrator, Civil Service (Medical): No    Lack of Transportation (Non-Medical): No  Physical Activity: Inactive (05/24/2024)   Exercise Vital Sign    Days of Exercise per Week: 0 days    Minutes of Exercise per Session: Not on file  Stress: No Stress Concern Present (05/24/2024)   [employer] of Occupational Health - Occupational Stress Questionnaire    Feeling of Stress: Only a little  Social Connections: Unknown (05/24/2024)   Social Connection and Isolation Panel    Frequency of Communication with Friends and Family: Once a week    Frequency of Social Gatherings with Friends and Family: Patient declined    Attends Religious Services: More than 4 times per year    Active Member of [employer] or Organizations: Yes    Attends Engineer, structural: More than 4 times per year    Marital Status:  Married       Objective:   Physical Exam Vitals reviewed.  Constitutional:      General: She is not in acute distress.    Appearance: She is well-developed. She is obese.  HENT:     Head: Normocephalic and atraumatic.     Right Ear: Tympanic membrane normal.     Left Ear: Tympanic membrane normal.  Eyes:     Pupils: Pupils are equal, round, and reactive to light.  Neck:     Thyroid : No thyromegaly.  Cardiovascular:     Rate and Rhythm: Normal rate and regular rhythm.     Heart sounds: Normal heart sounds. No murmur heard. Pulmonary:     Effort: Pulmonary effort is normal. No respiratory distress.     Breath sounds: Normal breath sounds. No wheezing.  Abdominal:     General: Bowel sounds are normal. There is no distension.     Palpations: Abdomen is soft.     Tenderness: There is no abdominal tenderness.  Musculoskeletal:  General: Tenderness present. No signs of injury.     Cervical back: Normal range of motion and neck supple.     Right lower leg: No edema.     Left lower leg: No edema.     Comments: Full ROM of right hip, but pain with internal and external rotation  Skin:    General: Skin is warm and dry.  Neurological:     Mental Status: She is alert and oriented to person, place, and time.     Cranial Nerves: No cranial nerve deficit.     Deep Tendon Reflexes: Reflexes are normal and symmetric.  Psychiatric:        Behavior: Behavior normal.        Thought Content: Thought content normal.        Judgment: Judgment normal.          BP 115/72   Pulse 74   Temp (!) 97 F (36.1 C) (Temporal)   Ht 5' 3 (1.6 m)   Wt 223 lb (101.2 kg)   LMP 06/29/2017   SpO2 96%   BMI 39.50 kg/m   Assessment & Plan:   Holly Rodriguez comes in today with chief complaint of Annual Exam   Diagnosis and orders addressed:  1. Annual physical exam (Primary) - Bayer DCA Hb A1c Waived - CBC with Differential/Platelet - Lipid panel - CMP14+EGFR - TSH - Vitamin  B12  2. Hypertension associated with diabetes (HCC) - CBC with Differential/Platelet - CMP14+EGFR  3. Morbid obesity (HCC) - CBC with Differential/Platelet - CMP14+EGFR  4. CKD stage 3 due to type 2 diabetes mellitus (HCC) - CBC with Differential/Platelet - CMP14+EGFR  5. Type 2 diabetes mellitus with hyperglycemia, without long-term current use of insulin  (HCC)  - Bayer DCA Hb A1c Waived - CBC with Differential/Platelet - CMP14+EGFR - Vitamin B12  6. Hypothyroidism, unspecified type - CBC with Differential/Platelet - CMP14+EGFR - TSH  7. OSA (obstructive sleep apnea) - CBC with Differential/Platelet - CMP14+EGFR  8. GAD (generalized anxiety disorder)  - CBC with Differential/Platelet - CMP14+EGFR  9. Hyperlipidemia associated with type 2 diabetes mellitus (HCC) - CBC with Differential/Platelet - Lipid panel - CMP14+EGFR  10. Myalgia due to statin  - CBC with Differential/Platelet - CMP14+EGFR  11. Right hip pain -X-ray pending  Start diclofenac  BID with food No other NSAID's  - CBC with Differential/Platelet - CMP14+EGFR - DG HIP UNILAT W OR W/O PELVIS 2-3 VIEWS RIGHT; Future - diclofenac  (VOLTAREN ) 75 MG EC tablet; Take 1 tablet (75 mg total) by mouth 2 (two) times daily.  Dispense: 60 tablet; Refill: 0  12. Vitamin D  deficiency - VITAMIN D  25 Hydroxy (Vit-D Deficiency, Fractures)   Labs pending -X-ray pending  Start diclofenac  BID with food No other NSAID's  Health Maintenance reviewed Diet and exercise encouraged  Follow up plan: 3 months    Bari Learn, FNP

## 2024-05-24 NOTE — Telephone Encounter (Unsigned)
 Copied from CRM [id number]. Topic: Clinical - Medication Refill >> May 24, 2024  5:14 PM Nathanel BROCKS wrote: 90 day supply please Medication:   allopurinol  (ZYLOPRIM ) 300 MG tablet Empagliflozin -metFORMIN  HCl (SYNJARDY ) 12.03-999 MG TABS  fenofibrate  160 MG tablet levothyroxine  (SYNTHROID ) 75 MCG tablet  losartan  (COZAAR ) 50 MG tablet metoprolol  succinate (TOPROL -XL) 25 MG 24 hr tablet  omeprazole  (PRILOSEC) 40 MG capsule tirzepatide  (MOUNJARO ) 10 MG/0.5ML Pen    Has the patient contacted their pharmacy? Yes  This is the patient's preferred pharmacy:  [facility] [place] - [address] [place] [address] Phone: [phone] Fax: [phone]  Is this the correct pharmacy for this prescription? Yes If no, delete pharmacy and type the correct one.   Has the prescription been filled recently? Yes  Is the patient out of the medication? Yes  Has the patient been seen for an appointment in the last year OR does the patient have an upcoming appointment? Yes  Can we respond through MyChart? Yes  Agent: Please be advised that Rx refills may take up to 3 business days. We ask that you follow-up with your pharmacy.

## 2024-05-25 ENCOUNTER — Other Ambulatory Visit: Payer: Self-pay | Admitting: Family

## 2024-05-25 ENCOUNTER — Ambulatory Visit: Payer: Self-pay | Admitting: Family

## 2024-05-25 DIAGNOSIS — E1165 Type 2 diabetes mellitus with hyperglycemia: Secondary | ICD-10-CM

## 2024-05-25 DIAGNOSIS — B372 Candidiasis of skin and nail: Secondary | ICD-10-CM

## 2024-05-25 DIAGNOSIS — E1159 Type 2 diabetes mellitus with other circulatory complications: Secondary | ICD-10-CM

## 2024-05-25 DIAGNOSIS — M109 Gout, unspecified: Secondary | ICD-10-CM

## 2024-05-25 DIAGNOSIS — M25551 Pain in right hip: Secondary | ICD-10-CM

## 2024-05-25 DIAGNOSIS — E1169 Type 2 diabetes mellitus with other specified complication: Secondary | ICD-10-CM

## 2024-05-25 DIAGNOSIS — E079 Disorder of thyroid, unspecified: Secondary | ICD-10-CM

## 2024-05-25 LAB — CBC WITH DIFFERENTIAL/PLATELET
Basophils Absolute: 0.1 x10E3/uL (ref 0.0–0.2)
Basos: 1 %
EOS (ABSOLUTE): 0.3 x10E3/uL (ref 0.0–0.4)
Eos: 3 %
Hematocrit: 41.3 % (ref 34.0–46.6)
Hemoglobin: 13.8 g/dL (ref 11.1–15.9)
Immature Grans (Abs): 0 x10E3/uL (ref 0.0–0.1)
Immature Granulocytes: 0 %
Lymphocytes Absolute: 3.9 x10E3/uL — ABNORMAL HIGH (ref 0.7–3.1)
Lymphs: 44 %
MCH: 30.4 pg (ref 26.6–33.0)
MCHC: 33.4 g/dL (ref 31.5–35.7)
MCV: 91 fL (ref 79–97)
Monocytes Absolute: 0.4 x10E3/uL (ref 0.1–0.9)
Monocytes: 5 %
Neutrophils Absolute: 4.2 x10E3/uL (ref 1.4–7.0)
Neutrophils: 47 %
Platelets: 287 x10E3/uL (ref 150–450)
RBC: 4.54 x10E6/uL (ref 3.77–5.28)
RDW: 13.9 % (ref 11.7–15.4)
WBC: 8.9 x10E3/uL (ref 3.4–10.8)

## 2024-05-25 LAB — VITAMIN B12: Vitamin B-12: 393 pg/mL (ref 232–1245)

## 2024-05-25 LAB — CMP14+EGFR
ALT: 13 IU/L (ref 0–32)
AST: 17 IU/L (ref 0–40)
Albumin: 4.1 g/dL (ref 3.8–4.9)
Alkaline Phosphatase: 72 IU/L (ref 44–121)
BUN/Creatinine Ratio: 17 (ref 9–23)
BUN: 21 mg/dL (ref 6–24)
Bilirubin Total: 0.2 mg/dL (ref 0.0–1.2)
CO2: 21 mmol/L (ref 20–29)
Calcium: 8.9 mg/dL (ref 8.7–10.2)
Chloride: 107 mmol/L — ABNORMAL HIGH (ref 96–106)
Creatinine, Ser: 1.22 mg/dL — ABNORMAL HIGH (ref 0.57–1.00)
Globulin, Total: 2.4 g/dL (ref 1.5–4.5)
Glucose: 89 mg/dL (ref 70–99)
Potassium: 3.8 mmol/L (ref 3.5–5.2)
Sodium: 143 mmol/L (ref 134–144)
Total Protein: 6.5 g/dL (ref 6.0–8.5)
eGFR: 51 mL/min/1.73 — ABNORMAL LOW (ref >59)

## 2024-05-25 LAB — LIPID PANEL
Chol/HDL Ratio: 3.6 ratio (ref 0.0–4.4)
Cholesterol, Total: 133 mg/dL (ref 100–199)
HDL: 37 mg/dL — ABNORMAL LOW (ref >39)
LDL Chol Calc (NIH): 66 mg/dL (ref 0–99)
Triglycerides: 176 mg/dL — ABNORMAL HIGH (ref 0–149)
VLDL Cholesterol Cal: 30 mg/dL (ref 5–40)

## 2024-05-25 LAB — VITAMIN D 25 HYDROXY (VIT D DEFICIENCY, FRACTURES): Vit D, 25-Hydroxy: 21.2 ng/mL — ABNORMAL LOW (ref 30.0–100.0)

## 2024-05-25 LAB — TSH: TSH: 1.2 u[IU]/mL (ref 0.450–4.500)

## 2024-05-25 MED ORDER — FENOFIBRATE 160 MG PO TABS: 160.0000 mg | ORAL_TABLET | Freq: Every day | ORAL | 2 refills | Status: DC

## 2024-05-25 MED ORDER — LEVOTHYROXINE SODIUM 75 MCG PO TABS: ORAL_TABLET | ORAL | 0 refills | Status: DC

## 2024-05-25 MED ORDER — OMEPRAZOLE 40 MG PO CPDR: 40.0000 mg | DELAYED_RELEASE_CAPSULE | Freq: Every day | ORAL | 2 refills | Status: DC

## 2024-05-25 MED ORDER — ALLOPURINOL 300 MG PO TABS: 300.0000 mg | ORAL_TABLET | Freq: Every day | ORAL | 2 refills | Status: DC

## 2024-05-25 MED ORDER — NYSTATIN 100000 UNIT/GM EX POWD: Freq: Two times a day (BID) | CUTANEOUS | 1 refills | Status: DC

## 2024-05-25 MED ORDER — ALLOPURINOL 300 MG PO TABS: 300.0000 mg | ORAL_TABLET | Freq: Every day | ORAL | 1 refills | Status: DC

## 2024-05-25 MED ORDER — LEVOTHYROXINE SODIUM 75 MCG PO TABS: ORAL_TABLET | ORAL | 2 refills | Status: DC

## 2024-05-25 MED ORDER — METOPROLOL SUCCINATE ER 25 MG PO TB24: 25.0000 mg | ORAL_TABLET | Freq: Every day | ORAL | 2 refills | Status: DC

## 2024-05-25 MED ORDER — LOSARTAN POTASSIUM 50 MG PO TABS: 50.0000 mg | ORAL_TABLET | Freq: Every day | ORAL | 2 refills | Status: DC

## 2024-05-25 MED ORDER — SYNJARDY 12.5-1000 MG PO TABS: 1.0000 | ORAL_TABLET | Freq: Two times a day (BID) | ORAL | 2 refills | Status: DC

## 2024-05-25 MED ORDER — TIRZEPATIDE 10 MG/0.5ML ~~LOC~~ SOAJ: 10.0000 mg | SUBCUTANEOUS | 2 refills | Status: DC

## 2024-05-25 MED ORDER — SYNJARDY 12.5-1000 MG PO TABS: 1.0000 | ORAL_TABLET | Freq: Two times a day (BID) | ORAL | 0 refills | Status: DC

## 2024-05-25 MED ORDER — DICLOFENAC SODIUM 75 MG PO TBEC: 75.0000 mg | DELAYED_RELEASE_TABLET | Freq: Two times a day (BID) | ORAL | 1 refills | Status: DC

## 2024-05-25 MED ORDER — TIRZEPATIDE 10 MG/0.5ML ~~LOC~~ SOAJ: 10.0000 mg | SUBCUTANEOUS | 0 refills | Status: DC

## 2024-06-21 DIAGNOSIS — D124 Benign neoplasm of descending colon: Secondary | ICD-10-CM | POA: Diagnosis not present

## 2024-06-21 DIAGNOSIS — K649 Unspecified hemorrhoids: Secondary | ICD-10-CM | POA: Diagnosis not present

## 2024-06-21 DIAGNOSIS — D125 Benign neoplasm of sigmoid colon: Secondary | ICD-10-CM | POA: Diagnosis not present

## 2024-06-21 DIAGNOSIS — Z09 Encounter for follow-up examination after completed treatment for conditions other than malignant neoplasm: Secondary | ICD-10-CM | POA: Diagnosis not present

## 2024-06-21 DIAGNOSIS — D127 Benign neoplasm of rectosigmoid junction: Secondary | ICD-10-CM | POA: Diagnosis not present

## 2024-06-21 DIAGNOSIS — Z860101 Personal history of adenomatous and serrated colon polyps: Secondary | ICD-10-CM | POA: Diagnosis not present

## 2024-07-07 DIAGNOSIS — H04123 Dry eye syndrome of bilateral lacrimal glands: Secondary | ICD-10-CM | POA: Diagnosis not present

## 2024-07-07 DIAGNOSIS — D3131 Benign neoplasm of right choroid: Secondary | ICD-10-CM | POA: Diagnosis not present

## 2024-07-07 DIAGNOSIS — E119 Type 2 diabetes mellitus without complications: Secondary | ICD-10-CM | POA: Diagnosis not present

## 2024-07-07 DIAGNOSIS — H2513 Age-related nuclear cataract, bilateral: Secondary | ICD-10-CM | POA: Diagnosis not present

## 2024-07-07 LAB — HM DIABETES EYE EXAM

## 2024-07-30 DIAGNOSIS — L249 Irritant contact dermatitis, unspecified cause: Secondary | ICD-10-CM | POA: Diagnosis not present

## 2024-07-30 DIAGNOSIS — L218 Other seborrheic dermatitis: Secondary | ICD-10-CM | POA: Diagnosis not present

## 2024-07-30 DIAGNOSIS — L814 Other melanin hyperpigmentation: Secondary | ICD-10-CM | POA: Diagnosis not present

## 2024-07-30 DIAGNOSIS — L821 Other seborrheic keratosis: Secondary | ICD-10-CM | POA: Diagnosis not present

## 2024-07-30 DIAGNOSIS — L820 Inflamed seborrheic keratosis: Secondary | ICD-10-CM | POA: Diagnosis not present

## 2024-07-30 DIAGNOSIS — L538 Other specified erythematous conditions: Secondary | ICD-10-CM | POA: Diagnosis not present

## 2024-08-11 ENCOUNTER — Telehealth: Admitting: Physician Assistant

## 2024-08-11 ENCOUNTER — Encounter

## 2024-08-11 DIAGNOSIS — J069 Acute upper respiratory infection, unspecified: Secondary | ICD-10-CM | POA: Diagnosis not present

## 2024-08-11 DIAGNOSIS — B9689 Other specified bacterial agents as the cause of diseases classified elsewhere: Secondary | ICD-10-CM

## 2024-08-11 MED ORDER — PREDNISONE 20 MG PO TABS: 40.0000 mg | ORAL_TABLET | Freq: Every day | ORAL | 0 refills | Status: DC

## 2024-08-11 MED ORDER — AMOXICILLIN-POT CLAVULANATE 875-125 MG PO TABS: 1.0000 | ORAL_TABLET | Freq: Two times a day (BID) | ORAL | 0 refills | Status: DC

## 2024-08-11 MED ORDER — IPRATROPIUM BROMIDE 0.03 % NA SOLN: 2.0000 | Freq: Two times a day (BID) | NASAL | 0 refills | Status: AC

## 2024-08-11 NOTE — Progress Notes (Signed)
 E-Visit for Sinus Problems  We are sorry that you are not feeling well.  Here is how we plan to help!  Based on what you have shared with me it looks like you have sinusitis.  Sinusitis is inflammation and infection in the sinus cavities of the head.  Based on your presentation I believe you most likely have Acute Bacterial Sinusitis.  This is an infection caused by bacteria and is treated with antibiotics. I have prescribed Augmentin  875mg /125mg  one tablet twice daily with food, for 7 days. and I have also prescribed Ipratropium Bromide  Nasal Spray Use 1 spray in each nostril twice daily as needed for drainage; discontinue if too drying. I have also prescribed Prednisone  20mg  Take 2 tablets (40mg ) daily for 5 days.  You may use an oral decongestant such as Mucinex D or if you have glaucoma or high blood pressure use plain Mucinex. Saline nasal spray help and can safely be used as often as needed for congestion.  If you develop worsening sinus pain, fever or notice severe headache and vision changes, or if symptoms are not better after completion of antibiotic, please schedule an appointment with a health care provider.    Sinus infections are not as easily transmitted as other respiratory infection, however we still recommend that you avoid close contact with loved ones, especially the very young and elderly.  Remember to wash your hands thoroughly throughout the day as this is the number one way to prevent the spread of infection!  Home Care: Only take medications as instructed by your medical team. Complete the entire course of an antibiotic. Do not take these medications with alcohol. A steam or ultrasonic humidifier can help congestion.  You can place a towel over your head and breathe in the steam from hot water coming from a faucet. Avoid close contacts especially the very young and the elderly. Cover your mouth when you cough or sneeze. Always remember to wash your hands.  Get Help Right  Away If: You develop worsening fever or sinus pain. You develop a severe head ache or visual changes. Your symptoms persist after you have completed your treatment plan.  Make sure you Understand these instructions. Will watch your condition. Will get help right away if you are not doing well or get worse.  Thank you for choosing an e-visit.  Your e-visit answers were reviewed by a board certified advanced clinical practitioner to complete your personal care plan. Depending upon the condition, your plan could have included both over the counter or prescription medications.  Please review your pharmacy choice. Make sure the pharmacy is open so you can pick up prescription now. If there is a problem, you may contact your provider through [employer] and have the prescription routed to another pharmacy.  Your safety is important to us . If you have drug allergies check your prescription carefully.   For the next 24 hours you can use MyChart to ask questions about today's visit, request a non-urgent call back, or ask for a work or school excuse. You will get an email in the next two days asking about your experience. I hope that your e-visit has been valuable and will speed your recovery.    I have spent 5 minutes in review of e-visit questionnaire, review and updating patient chart, medical decision making and response to patient.   Delon CHRISTELLA Dickinson, PA-C

## 2024-08-24 ENCOUNTER — Encounter: Payer: Self-pay | Admitting: Family

## 2024-08-24 ENCOUNTER — Ambulatory Visit: Admitting: Family

## 2024-08-24 VITALS — BP 120/81 | HR 79 | Temp 97.3°F | Ht 63.0 in | Wt 228.6 lb

## 2024-08-24 DIAGNOSIS — E1169 Type 2 diabetes mellitus with other specified complication: Secondary | ICD-10-CM | POA: Diagnosis not present

## 2024-08-24 DIAGNOSIS — E1165 Type 2 diabetes mellitus with hyperglycemia: Secondary | ICD-10-CM

## 2024-08-24 DIAGNOSIS — E1122 Type 2 diabetes mellitus with diabetic chronic kidney disease: Secondary | ICD-10-CM

## 2024-08-24 DIAGNOSIS — E079 Disorder of thyroid, unspecified: Secondary | ICD-10-CM | POA: Diagnosis not present

## 2024-08-24 DIAGNOSIS — N183 Chronic kidney disease, stage 3 unspecified: Secondary | ICD-10-CM

## 2024-08-24 DIAGNOSIS — E039 Hypothyroidism, unspecified: Secondary | ICD-10-CM

## 2024-08-24 DIAGNOSIS — T466X5A Adverse effect of antihyperlipidemic and antiarteriosclerotic drugs, initial encounter: Secondary | ICD-10-CM

## 2024-08-24 DIAGNOSIS — E1159 Type 2 diabetes mellitus with other circulatory complications: Secondary | ICD-10-CM

## 2024-08-24 DIAGNOSIS — I152 Hypertension secondary to endocrine disorders: Secondary | ICD-10-CM

## 2024-08-24 DIAGNOSIS — F411 Generalized anxiety disorder: Secondary | ICD-10-CM

## 2024-08-24 DIAGNOSIS — E785 Hyperlipidemia, unspecified: Secondary | ICD-10-CM

## 2024-08-24 DIAGNOSIS — M791 Myalgia, unspecified site: Secondary | ICD-10-CM

## 2024-08-24 MED ORDER — NYSTATIN 100000 UNIT/GM EX POWD: Freq: Two times a day (BID) | CUTANEOUS | 1 refills | Status: AC

## 2024-08-24 MED ORDER — METOPROLOL SUCCINATE ER 25 MG PO TB24
25.0000 mg | ORAL_TABLET | Freq: Every day | ORAL | 2 refills | Status: DC
Start: 2024-08-24 — End: 2024-08-27

## 2024-08-24 MED ORDER — FENOFIBRATE 160 MG PO TABS
160.0000 mg | ORAL_TABLET | Freq: Every day | ORAL | 2 refills | Status: DC
Start: 2024-08-24 — End: 2024-08-27

## 2024-08-24 MED ORDER — LEVOTHYROXINE SODIUM 75 MCG PO TABS: ORAL_TABLET | ORAL | 2 refills | Status: DC

## 2024-08-24 MED ORDER — TIRZEPATIDE 12.5 MG/0.5ML ~~LOC~~ SOAJ: 12.5000 mg | SUBCUTANEOUS | 2 refills | Status: DC

## 2024-08-24 MED ORDER — FLUCONAZOLE 150 MG PO TABS: 150.0000 mg | ORAL_TABLET | Freq: Once | ORAL | 0 refills | Status: AC

## 2024-08-24 MED ORDER — DICLOFENAC SODIUM 75 MG PO TBEC: 75.0000 mg | DELAYED_RELEASE_TABLET | Freq: Two times a day (BID) | ORAL | 1 refills | Status: DC

## 2024-08-24 MED ORDER — OMEPRAZOLE 40 MG PO CPDR: 40.0000 mg | DELAYED_RELEASE_CAPSULE | Freq: Every day | ORAL | 2 refills | Status: DC

## 2024-08-24 MED ORDER — LOSARTAN POTASSIUM 50 MG PO TABS: 50.0000 mg | ORAL_TABLET | Freq: Every day | ORAL | 2 refills | Status: DC

## 2024-08-24 MED ORDER — SYNJARDY 12.5-1000 MG PO TABS: 1.0000 | ORAL_TABLET | Freq: Two times a day (BID) | ORAL | 2 refills | Status: DC

## 2024-08-24 NOTE — Progress Notes (Signed)
 Subjective:    Patient ID: Holly Rodriguez, female    DOB: 04-Apr-1966, 58 y.o.   MRN: [id number]  Chief Complaint  Patient presents with   Medical Management of Chronic Issues   Pt presents to the office today for chronic follow up.   She was seen on 01/24/23 and her Ozempic  2 mg and we changed to Moujaro 10 mg. She has lost 31 lbs.  Her starting weight was  259 lb.      08/24/2024    2:12 PM 05/24/2024    3:04 PM 10/06/2023   12:06 PM  Last 3 Weights  Weight (lbs) 228 lb 9.6 oz 223 lb 227 lb  Weight (kg) 103.692 kg 101.152 kg 102.967 kg    She has OSA and uses CPAP nightly.   She has CKD that is stable.   She can not tolerate statins because of myalgia.   She is morbid obese with a BMI of 40 with HTN and DM.  Diabetes She presents for her follow-up diabetic visit. She has type 2 diabetes mellitus. Pertinent negatives for hypoglycemia include no nervousness/anxiousness. Associated symptoms include fatigue and foot paresthesias. Pertinent negatives for diabetes include no blurred vision. Symptoms are stable. Diabetic complications include peripheral neuropathy. Risk factors for coronary artery disease include dyslipidemia, diabetes mellitus, hypertension, sedentary lifestyle and post-menopausal. She is following a generally unhealthy diet. (Not checking BS at home) Eye exam is current.  Hypertension This is a chronic problem. The current episode started more than 1 year ago. The problem has been resolved since onset. The problem is controlled. Associated symptoms include malaise/fatigue and peripheral edema. Pertinent negatives include no blurred vision or shortness of breath. Risk factors for coronary artery disease include dyslipidemia, diabetes mellitus, obesity and sedentary lifestyle. The current treatment provides moderate improvement. Identifiable causes of hypertension include a thyroid  problem.  Thyroid  Problem Presents for follow-up visit. Symptoms include constipation,  dry skin and fatigue. Patient reports no anxiety, diaphoresis or diarrhea. The symptoms have been stable.  Hyperlipidemia This is a chronic problem. The current episode started more than 1 year ago. Recent lipid tests were reviewed and are normal. Exacerbating diseases include obesity. Pertinent negatives include no shortness of breath. Current antihyperlipidemic treatment includes diet change. The current treatment provides no improvement of lipids. Risk factors for coronary artery disease include dyslipidemia, diabetes mellitus, hypertension, a sedentary lifestyle, post-menopausal and obesity.      Review of Systems  Constitutional:  Positive for fatigue and malaise/fatigue. Negative for diaphoresis.  Eyes:  Negative for blurred vision.  Respiratory:  Negative for shortness of breath.   Gastrointestinal:  Positive for constipation. Negative for diarrhea.  Psychiatric/Behavioral:  The patient is not nervous/anxious.   All other systems reviewed and are negative.  Family History  Problem Relation Age of Onset   Arthritis Mother    Fibromyalgia Mother    Hyperlipidemia Mother    Arthritis Maternal Grandmother    Heart disease Maternal Grandmother    Alzheimer's disease Maternal Grandfather    Gout Daughter    Gout Son    Social History   Socioeconomic History   Marital status: Married    Spouse name: Not on file   Number of children: Not on file   Years of education: Not on file   Highest education level: Associate degree: occupational, Scientist, product/process development, or vocational program  Occupational History   Not on file  Tobacco Use   Smoking status: Former    Current packs/day: 0.00    Types:  Cigarettes    Start date: 11/18/2006    Quit date: 11/18/2008    Years since quitting: 15.7   Smokeless tobacco: Never   Tobacco comments:    quit smoking about 1o0 years ago but passive smoking exposure  Vaping Use   Vaping status: Never Used  Substance and Sexual Activity   Alcohol use: Yes     Comment: occasional   Drug use: No   Sexual activity: Not on file  Other Topics Concern   Not on file  Social History Narrative   Not on file   Social Drivers of Health   Financial Resource Strain: Medium Risk (05/24/2024)   Overall Financial Resource Strain (CARDIA)    Difficulty of Paying Living Expenses: Somewhat hard  Food Insecurity: Food Insecurity Present (05/24/2024)   Hunger Vital Sign    Worried About Running Out of Food in the Last Year: Sometimes true    Ran Out of Food in the Last Year: Sometimes true  Transportation Needs: No Transportation Needs (05/24/2024)   PRAPARE - Administrator, Civil Service (Medical): No    Lack of Transportation (Non-Medical): No  Physical Activity: Inactive (05/24/2024)   Exercise Vital Sign    Days of Exercise per Week: 0 days    Minutes of Exercise per Session: Not on file  Stress: No Stress Concern Present (05/24/2024)   [employer] of Occupational Health - Occupational Stress Questionnaire    Feeling of Stress: Only a little  Social Connections: Unknown (05/24/2024)   Social Connection and Isolation Panel    Frequency of Communication with Friends and Family: Once a week    Frequency of Social Gatherings with Friends and Family: Patient declined    Attends Religious Services: More than 4 times per year    Active Member of [employer] or Organizations: Yes    Attends Engineer, structural: More than 4 times per year    Marital Status: Married       Objective:   Physical Exam Vitals reviewed.  Constitutional:      General: She is not in acute distress.    Appearance: She is well-developed. She is obese.  HENT:     Head: Normocephalic and atraumatic.     Right Ear: Tympanic membrane normal.     Left Ear: Tympanic membrane normal.  Eyes:     Pupils: Pupils are equal, round, and reactive to light.  Neck:     Thyroid : No thyromegaly.  Cardiovascular:     Rate and Rhythm: Normal rate and regular rhythm.     Heart  sounds: Normal heart sounds. No murmur heard. Pulmonary:     Effort: Pulmonary effort is normal. No respiratory distress.     Breath sounds: Normal breath sounds. No wheezing.  Abdominal:     General: Bowel sounds are normal. There is no distension.     Palpations: Abdomen is soft.     Tenderness: There is no abdominal tenderness.  Musculoskeletal:        General: No tenderness or signs of injury. Normal range of motion.     Cervical back: Normal range of motion and neck supple.     Right lower leg: Edema (trace) present.     Left lower leg: Edema (trace) present.  Skin:    General: Skin is warm and dry.  Neurological:     Mental Status: She is alert and oriented to person, place, and time.     Cranial Nerves: No cranial nerve deficit.  Deep Tendon Reflexes: Reflexes are normal and symmetric.  Psychiatric:        Behavior: Behavior normal.        Thought Content: Thought content normal.        Judgment: Judgment normal.        BP 120/81   Pulse 79   Temp (!) 97.3 F (36.3 C) (Temporal)   Ht 5' 3 (1.6 m)   Wt 228 lb 9.6 oz (103.7 kg)   LMP 06/29/2017   BMI 40.49 kg/m   Assessment & Plan:   Makaylen Thieme comes in today with chief complaint of Medical Management of Chronic Issues   Diagnosis and orders addressed:  1. Type 2 diabetes mellitus with hyperglycemia, without long-term current use of insulin  (HCC) (Primary) - Empagliflozin -metFORMIN  HCl (SYNJARDY ) 12.03-999 MG TABS; Take 1 tablet by mouth 2 (two) times daily.  Dispense: 180 tablet; Refill: 2 - Microalbumin / creatinine urine ratio  2. Hyperlipidemia associated with type 2 diabetes mellitus (HCC) - fenofibrate  160 MG tablet; Take 1 tablet (160 mg total) by mouth daily.  Dispense: 90 tablet; Refill: 2  3. Thyroid  disease - levothyroxine  (SYNTHROID ) 75 MCG tablet; TAKE 1 TABLET BY MOUTH EVERY MORNING BEFORE BREAKFAST  Dispense: 90 tablet; Refill: 2  4. Hypertension associated with diabetes (HCC) -  losartan  (COZAAR ) 50 MG tablet; Take 1 tablet (50 mg total) by mouth daily.  Dispense: 90 tablet; Refill: 2 - metoprolol  succinate (TOPROL -XL) 25 MG 24 hr tablet; Take 1 tablet (25 mg total) by mouth daily.  Dispense: 90 tablet; Refill: 2  5. Morbid obesity (HCC)   6. Hypothyroidism, unspecified type  7. CKD stage 3 due to type 2 diabetes mellitus (HCC)  8. GAD (generalized anxiety disorder)  9. Myalgia due to statin   Labs reviewed from last visit Will increase Mounjaro  to 12.5 mg from 10 mg Low carb diet  Continue current medications  Diet and exercise encouraged  Follow up plan: 3 months    Bari Learn, FNP

## 2024-08-24 NOTE — Patient Instructions (Signed)

## 2024-08-25 LAB — MICROALBUMIN / CREATININE URINE RATIO
Creatinine, Urine: 104.1 mg/dL
Microalb/Creat Ratio: 5 mg/g{creat} (ref 0–29)
Microalbumin, Urine: 5.2 ug/mL

## 2024-08-26 ENCOUNTER — Ambulatory Visit: Payer: Self-pay | Admitting: Family

## 2024-08-26 DIAGNOSIS — E079 Disorder of thyroid, unspecified: Secondary | ICD-10-CM

## 2024-08-26 DIAGNOSIS — M109 Gout, unspecified: Secondary | ICD-10-CM

## 2024-08-26 DIAGNOSIS — E1159 Type 2 diabetes mellitus with other circulatory complications: Secondary | ICD-10-CM

## 2024-08-26 DIAGNOSIS — E1169 Type 2 diabetes mellitus with other specified complication: Secondary | ICD-10-CM

## 2024-08-27 MED ORDER — LEVOTHYROXINE SODIUM 75 MCG PO TABS: ORAL_TABLET | ORAL | 2 refills | Status: DC

## 2024-08-27 MED ORDER — LOSARTAN POTASSIUM 50 MG PO TABS: 50.0000 mg | ORAL_TABLET | Freq: Every day | ORAL | 2 refills | Status: DC

## 2024-08-27 MED ORDER — DICLOFENAC SODIUM 75 MG PO TBEC: 75.0000 mg | DELAYED_RELEASE_TABLET | Freq: Two times a day (BID) | ORAL | 1 refills | Status: DC

## 2024-08-27 MED ORDER — OMEPRAZOLE 40 MG PO CPDR: 40.0000 mg | DELAYED_RELEASE_CAPSULE | Freq: Every day | ORAL | 2 refills | Status: DC

## 2024-08-27 MED ORDER — EMPAGLIFLOZIN 25 MG PO TABS: 25.0000 mg | ORAL_TABLET | Freq: Every day | ORAL | 3 refills | Status: DC

## 2024-08-27 MED ORDER — FENOFIBRATE 160 MG PO TABS: 160.0000 mg | ORAL_TABLET | Freq: Every day | ORAL | 2 refills | Status: DC

## 2024-08-27 MED ORDER — METFORMIN HCL 1000 MG PO TABS: 1000.0000 mg | ORAL_TABLET | Freq: Two times a day (BID) | ORAL | 3 refills | Status: DC

## 2024-08-27 MED ORDER — ALLOPURINOL 300 MG PO TABS: 300.0000 mg | ORAL_TABLET | Freq: Every day | ORAL | 1 refills | Status: DC

## 2024-08-27 MED ORDER — METOPROLOL SUCCINATE ER 25 MG PO TB24: 25.0000 mg | ORAL_TABLET | Freq: Every day | ORAL | 2 refills | Status: DC

## 2024-09-10 DIAGNOSIS — Z1231 Encounter for screening mammogram for malignant neoplasm of breast: Secondary | ICD-10-CM | POA: Diagnosis not present

## 2024-09-10 LAB — HM MAMMOGRAPHY

## 2024-10-21 ENCOUNTER — Telehealth: Admitting: Physician Assistant

## 2024-10-21 DIAGNOSIS — H6692 Otitis media, unspecified, left ear: Secondary | ICD-10-CM

## 2024-10-21 MED ORDER — AMOXICILLIN 875 MG PO TABS: 875.0000 mg | ORAL_TABLET | Freq: Two times a day (BID) | ORAL | 0 refills | Status: AC

## 2024-10-21 NOTE — Progress Notes (Signed)
 E-Visit for Ear Pain - Acute Otitis Media   We are sorry that you are not feeling well. Here is how we plan to help!  Based on what you have shared with me it looks like you have Acute Otitis Media.  Acute Otitis Media is an infection of the middle or inner ear. This type of infection can cause redness, inflammation, and fluid buildup behind the tympanic membrane (ear drum).  The usual symptoms include: Earache/Pain Fever Upper respiratory symptoms Lack of energy/Fatigue/Malaise Slight hearing loss gradually worsening- if the inner ear fills with fluid What causes middle ear infections? Most middle ear infections occur when an infection such as a cold, leads to a build-up of mucus in the middle ear and causes the Eustachian tube (a thin tube that runs from the middle ear to the back of the nose) to become swollen or blocked.   This means mucus can't drain away properly, making it easier for an infection to spread into the middle ear.  How middle ear infections are treated: Most ear infections clear up within three to five days and don't need any specific treatment. If necessary, tylenol  or ibuprofen  should be used to relieve pain and a high temperature.  If you develop a fever higher than 102, or any significantly worsening symptoms, this could indicate a more serious infection moving to the middle/inner and needs face to face evaluation in an office by a provider.   Antibiotics aren't routinely used to treat middle ear infections, although they may occasionally be prescribed if symptoms persist or are particularly severe. Given your presentation,   I have prescribed Amoxicillin  875 mg one tablet twice daily for 10 days    Your symptoms should improve over the next 3 days and should resolve in about 7 days. Be sure to complete ALL of the prescription(s) given.  HOME CARE: Wash your hands frequently. If you are prescribed an ear drop, do not place the tip of the bottle on your ear or  touch it with your fingers. You can take Acetaminophen  650 mg every 4-6 hours as needed for pain.  If pain is severe or moderate, you can apply a heating pad (set on low) or hot water bottle (wrapped in a towel) to outer ear for 20 minutes.  This will also increase drainage.  GET HELP RIGHT AWAY IF: Fever is over 102.2 degrees. You develop progressive ear pain or hearing loss. Ear symptoms persist longer than 3 days after treatment.  MAKE SURE YOU: Understand these instructions. Will watch your condition. Will get help right away if you are not doing well or get worse.  Thank you for choosing an e-visit.  Your e-visit answers were reviewed by a board certified advanced clinical practitioner to complete your personal care plan. Depending upon the condition, your plan could have included both over the counter or prescription medications.  Please review your pharmacy choice. Make sure the pharmacy is open so you can pick up the prescription now. If there is a problem, you may contact your provider through [employer] and have the prescription routed to another pharmacy.  Your safety is important to us . If you have drug allergies check your prescription carefully.   For the next 24 hours you can use MyChart to ask questions about today's visit, request a non-urgent call back, or ask for a work or school excuse. You will get an email with a survey after your eVisit asking about your experience. We would appreciate your feedback. I  hope that your e-visit has been valuable and will aid in your recovery.  I have spent 5 minutes in review of e-visit questionnaire, review and updating patient chart, medical decision making and response to patient.   Elsie Velma Lunger, PA-C

## 2024-11-26 ENCOUNTER — Other Ambulatory Visit: Payer: Self-pay | Admitting: Family

## 2024-11-26 ENCOUNTER — Encounter: Payer: Self-pay | Admitting: Family

## 2024-11-26 ENCOUNTER — Ambulatory Visit: Payer: Self-pay | Admitting: Family

## 2024-11-26 VITALS — BP 123/86 | HR 73 | Temp 98.5°F | Ht 63.0 in | Wt 225.2 lb

## 2024-11-26 DIAGNOSIS — E1122 Type 2 diabetes mellitus with diabetic chronic kidney disease: Secondary | ICD-10-CM

## 2024-11-26 DIAGNOSIS — Z79899 Other long term (current) drug therapy: Secondary | ICD-10-CM

## 2024-11-26 DIAGNOSIS — E66812 Obesity, class 2: Secondary | ICD-10-CM | POA: Diagnosis not present

## 2024-11-26 DIAGNOSIS — E039 Hypothyroidism, unspecified: Secondary | ICD-10-CM | POA: Diagnosis not present

## 2024-11-26 DIAGNOSIS — N1832 Chronic kidney disease, stage 3b: Secondary | ICD-10-CM | POA: Diagnosis not present

## 2024-11-26 DIAGNOSIS — N183 Chronic kidney disease, stage 3 unspecified: Secondary | ICD-10-CM

## 2024-11-26 DIAGNOSIS — E559 Vitamin D deficiency, unspecified: Secondary | ICD-10-CM

## 2024-11-26 DIAGNOSIS — E1165 Type 2 diabetes mellitus with hyperglycemia: Secondary | ICD-10-CM

## 2024-11-26 DIAGNOSIS — E1169 Type 2 diabetes mellitus with other specified complication: Secondary | ICD-10-CM | POA: Diagnosis not present

## 2024-11-26 DIAGNOSIS — G4733 Obstructive sleep apnea (adult) (pediatric): Secondary | ICD-10-CM

## 2024-11-26 DIAGNOSIS — F411 Generalized anxiety disorder: Secondary | ICD-10-CM | POA: Diagnosis not present

## 2024-11-26 DIAGNOSIS — E611 Iron deficiency: Secondary | ICD-10-CM | POA: Diagnosis not present

## 2024-11-26 DIAGNOSIS — K219 Gastro-esophageal reflux disease without esophagitis: Secondary | ICD-10-CM | POA: Insufficient documentation

## 2024-11-26 DIAGNOSIS — M1A9XX0 Chronic gout, unspecified, without tophus (tophi): Secondary | ICD-10-CM

## 2024-11-26 DIAGNOSIS — E1159 Type 2 diabetes mellitus with other circulatory complications: Secondary | ICD-10-CM | POA: Diagnosis not present

## 2024-11-26 DIAGNOSIS — Z1159 Encounter for screening for other viral diseases: Secondary | ICD-10-CM

## 2024-11-26 DIAGNOSIS — I152 Hypertension secondary to endocrine disorders: Secondary | ICD-10-CM

## 2024-11-26 DIAGNOSIS — M791 Myalgia, unspecified site: Secondary | ICD-10-CM | POA: Diagnosis not present

## 2024-11-26 LAB — BAYER DCA HB A1C WAIVED: HB A1C (BAYER DCA - WAIVED): 5.7 % — ABNORMAL HIGH (ref 4.8–5.6)

## 2024-11-26 MED ORDER — FENOFIBRATE 160 MG PO TABS: 160.0000 mg | ORAL_TABLET | Freq: Every day | ORAL | 2 refills | Status: DC

## 2024-11-26 MED ORDER — LEVOTHYROXINE SODIUM 75 MCG PO TABS: ORAL_TABLET | ORAL | 2 refills | Status: DC

## 2024-11-26 MED ORDER — OMEPRAZOLE 40 MG PO CPDR: 40.0000 mg | DELAYED_RELEASE_CAPSULE | Freq: Every day | ORAL | 2 refills | Status: AC

## 2024-11-26 MED ORDER — LOSARTAN POTASSIUM 50 MG PO TABS: 50.0000 mg | ORAL_TABLET | Freq: Every day | ORAL | 2 refills | Status: DC

## 2024-11-26 MED ORDER — TIRZEPATIDE 12.5 MG/0.5ML ~~LOC~~ SOAJ: 12.5000 mg | SUBCUTANEOUS | 2 refills | Status: AC

## 2024-11-26 MED ORDER — SYNJARDY XR 25-1000 MG PO TB24: 1.0000 | ORAL_TABLET | Freq: Every day | ORAL | 1 refills | Status: AC

## 2024-11-26 MED ORDER — ALLOPURINOL 300 MG PO TABS: 300.0000 mg | ORAL_TABLET | Freq: Every day | ORAL | 1 refills | Status: DC

## 2024-11-26 MED ORDER — DICLOFENAC SODIUM 75 MG PO TBEC: 75.0000 mg | DELAYED_RELEASE_TABLET | Freq: Two times a day (BID) | ORAL | 1 refills | Status: DC

## 2024-11-26 MED ORDER — METOPROLOL SUCCINATE ER 25 MG PO TB24: 25.0000 mg | ORAL_TABLET | Freq: Every day | ORAL | 2 refills | Status: AC

## 2024-11-26 NOTE — Progress Notes (Signed)
 "  Subjective:    Patient ID: Holly Rodriguez, female    DOB: 24-Mar-1966, 59 y.o.   MRN: [id number]  Chief Complaint  Patient presents with   Medical Management of Chronic Issues   Pt presents to the office today for chronic follow up.   She was seen on 01/24/23 and her Ozempic  2 mg and we changed to Moujaro 10 mg. She has lost 34 lbs.  Her starting weight was  259 lb.      11/26/2024    3:03 PM 08/24/2024    2:12 PM 05/24/2024    3:04 PM  Last 3 Weights  Weight (lbs) 225 lb 3.2 oz 228 lb 9.6 oz 223 lb  Weight (kg) 102.15 kg 103.692 kg 101.152 kg    She has OSA and uses CPAP nightly.   She has CKD that is stable.   She can not tolerate statins because of myalgia.   She is morbid obese with a BMI of 40 with HTN and DM.   Has gout and takes allopurinol  300 mg daily.  Diabetes She presents for her follow-up diabetic visit. She has type 2 diabetes mellitus. Pertinent negatives for hypoglycemia include no nervousness/anxiousness. Associated symptoms include fatigue and foot paresthesias. Pertinent negatives for diabetes include no blurred vision. Symptoms are stable. Diabetic complications include peripheral neuropathy. Risk factors for coronary artery disease include dyslipidemia, diabetes mellitus, hypertension, sedentary lifestyle, post-menopausal and obesity. She is following a generally unhealthy diet. (Not checking BS at home) Eye exam is current.  Hypertension This is a chronic problem. The current episode started more than 1 year ago. The problem has been resolved since onset. The problem is controlled. Associated symptoms include malaise/fatigue. Pertinent negatives include no blurred vision, peripheral edema or shortness of breath. Risk factors for coronary artery disease include dyslipidemia, diabetes mellitus, obesity, sedentary lifestyle and post-menopausal state. The current treatment provides moderate improvement. Identifiable causes of hypertension include a thyroid  problem.   Thyroid  Problem Presents for follow-up visit. Symptoms include constipation, dry skin and fatigue. Patient reports no anxiety, diaphoresis or diarrhea. The symptoms have been stable.  Hyperlipidemia This is a chronic problem. The current episode started more than 1 year ago. Recent lipid tests were reviewed and are normal (LDL). Exacerbating diseases include obesity. Pertinent negatives include no shortness of breath. Current antihyperlipidemic treatment includes diet change. The current treatment provides mild improvement of lipids. Risk factors for coronary artery disease include dyslipidemia, diabetes mellitus, hypertension, a sedentary lifestyle, post-menopausal and obesity.      Review of Systems  Constitutional:  Positive for fatigue and malaise/fatigue. Negative for diaphoresis.  Eyes:  Negative for blurred vision.  Respiratory:  Negative for shortness of breath.   Gastrointestinal:  Positive for constipation. Negative for diarrhea.  Psychiatric/Behavioral:  The patient is not nervous/anxious.   All other systems reviewed and are negative.  Family History  Problem Relation Age of Onset   Arthritis Mother    Fibromyalgia Mother    Hyperlipidemia Mother    Arthritis Maternal Grandmother    Heart disease Maternal Grandmother    Alzheimer's disease Maternal Grandfather    Gout Daughter    Gout Son    Social History   Socioeconomic History   Marital status: Married    Spouse name: Not on file   Number of children: Not on file   Years of education: Not on file   Highest education level: Associate degree: occupational, scientist, product/process development, or vocational program  Occupational History   Not on file  Tobacco Use   Smoking status: Former    Current packs/day: 0.00    Types: Cigarettes    Start date: 11/18/2006    Quit date: 11/18/2008    Years since quitting: 16.0   Smokeless tobacco: Never   Tobacco comments:    quit smoking about 1o0 years ago but passive smoking exposure  Vaping Use    Vaping status: Never Used  Substance and Sexual Activity   Alcohol use: Yes    Comment: occasional   Drug use: No   Sexual activity: Not on file  Other Topics Concern   Not on file  Social History Narrative   Not on file   Social Drivers of Health   Tobacco Use: Medium Risk (11/26/2024)   Patient History    Smoking Tobacco Use: Former    Smokeless Tobacco Use: Never    Passive Exposure: Not on file  Financial Resource Strain: Medium Risk (11/25/2024)   Overall Financial Resource Strain (CARDIA)    Difficulty of Paying Living Expenses: Somewhat hard  Food Insecurity: No Food Insecurity (11/25/2024)   Epic    Worried About Radiation Protection Practitioner of Food in the Last Year: Never true    Ran Out of Food in the Last Year: Never true  Transportation Needs: No Transportation Needs (11/25/2024)   Epic    Lack of Transportation (Medical): No    Lack of Transportation (Non-Medical): No  Physical Activity: Inactive (11/25/2024)   Exercise Vital Sign    Days of Exercise per Week: 0 days    Minutes of Exercise per Session: Not on file  Stress: No Stress Concern Present (11/25/2024)   [employer] of Occupational Health - Occupational Stress Questionnaire    Feeling of Stress: Only a little  Social Connections: Socially Integrated (11/25/2024)   Social Connection and Isolation Panel    Frequency of Communication with Friends and Family: More than three times a week    Frequency of Social Gatherings with Friends and Family: Once a week    Attends Religious Services: More than 4 times per year    Active Member of [employer] or Organizations: Yes    Attends Banker Meetings: 1 to 4 times per year    Marital Status: Married  Depression (PHQ2-9): Low Risk (08/24/2024)   Depression (PHQ2-9)    PHQ-2 Score: 1  Alcohol Screen: Not on file  Housing: Low Risk (11/25/2024)   Epic    Unable to Pay for Housing in the Last Year: No    Number of Times Moved in the Last Year: 0    Homeless in the Last Year:  No  Utilities: Not on file  Health Literacy: Not on file       Objective:   Physical Exam Vitals reviewed.  Constitutional:      General: She is not in acute distress.    Appearance: She is well-developed. She is obese.  HENT:     Head: Normocephalic and atraumatic.     Right Ear: Tympanic membrane normal.     Left Ear: Tympanic membrane normal.  Eyes:     Pupils: Pupils are equal, round, and reactive to light.  Neck:     Thyroid : No thyromegaly.  Cardiovascular:     Rate and Rhythm: Normal rate and regular rhythm.     Heart sounds: Normal heart sounds. No murmur heard. Pulmonary:     Effort: Pulmonary effort is normal. No respiratory distress.     Breath sounds: Normal breath sounds. No wheezing.  Abdominal:     General: Bowel sounds are normal. There is no distension.     Palpations: Abdomen is soft.     Tenderness: There is no abdominal tenderness.  Musculoskeletal:        General: No tenderness or signs of injury. Normal range of motion.     Cervical back: Normal range of motion and neck supple.     Right lower leg: No edema.     Left lower leg: No edema.  Skin:    General: Skin is warm and dry.  Neurological:     Mental Status: She is alert and oriented to person, place, and time.     Cranial Nerves: No cranial nerve deficit.     Deep Tendon Reflexes: Reflexes are normal and symmetric.  Psychiatric:        Behavior: Behavior normal.        Thought Content: Thought content normal.        Judgment: Judgment normal.        BP 123/86   Pulse 73   Temp 98.5 F (36.9 C) (Temporal)   Ht 5' 3 (1.6 m)   Wt 225 lb 3.2 oz (102.2 kg)   LMP 06/29/2017   SpO2 98%   BMI 39.89 kg/m   Assessment & Plan:   Mieko Kneebone comes in today with chief complaint of Medical Management of Chronic Issues   Diagnosis and orders addressed:  1. GAD (generalized anxiety disorder) (Primary) - CMP14+EGFR  2. Controlled substance agreement signed - CMP14+EGFR  3. CKD  stage 3 due to type 2 diabetes mellitus (HCC)  - CMP14+EGFR  4. Hyperlipidemia associated with type 2 diabetes mellitus (HCC) - CMP14+EGFR - fenofibrate  160 MG tablet; Take 1 tablet (160 mg total) by mouth daily.  Dispense: 90 tablet; Refill: 2  5. Hypertension associated with diabetes (HCC) - CMP14+EGFR - losartan  (COZAAR ) 50 MG tablet; Take 1 tablet (50 mg total) by mouth daily.  Dispense: 90 tablet; Refill: 2 - metoprolol  succinate (TOPROL -XL) 25 MG 24 hr tablet; Take 1 tablet (25 mg total) by mouth daily.  Dispense: 90 tablet; Refill: 2  6. Hypothyroidism, unspecified type - CMP14+EGFR - TSH - levothyroxine  (SYNTHROID ) 75 MCG tablet; TAKE 1 TABLET BY MOUTH EVERY MORNING BEFORE BREAKFAST  Dispense: 90 tablet; Refill: 2  7. Iron  deficiency  - CMP14+EGFR  8. Morbid obesity (HCC) - CMP14+EGFR  9. Myalgia due to statin - CMP14+EGFR  10. OSA (obstructive sleep apnea)  - CMP14+EGFR  11. Type 2 diabetes mellitus with hyperglycemia, without long-term current use of insulin  (HCC) - Bayer DCA Hb A1c Waived - CMP14+EGFR  12. Stage 3b chronic kidney disease (HCC)  - CMP14+EGFR  13. Vitamin D  deficiency - CMP14+EGFR  14. Chronic gout without tophus, unspecified cause, unspecified site  - CMP14+EGFR - allopurinol  (ZYLOPRIM ) 300 MG tablet; Take 1 tablet (300 mg total) by mouth daily.  Dispense: 90 tablet; Refill: 1  15. Need for hepatitis B screening test - CMP14+EGFR - Hepatitis B surface antibody,quantitative  16. Gastroesophageal reflux disease without esophagitis - omeprazole  (PRILOSEC) 40 MG capsule; Take 1 capsule (40 mg total) by mouth daily.  Dispense: 90 capsule; Refill: 2    Labs reviewed from last visit Will continue  Mounjaro  to 12.5 mg  Low carb diet  Continue current medications  Diet and exercise encouraged  Follow up plan: 3 months    Bari Learn, FNP  "

## 2024-11-26 NOTE — Patient Instructions (Signed)

## 2024-11-27 LAB — CMP14+EGFR
ALT: 18 IU/L (ref 0–32)
AST: 21 IU/L (ref 0–40)
Albumin: 4.3 g/dL (ref 3.8–4.9)
Alkaline Phosphatase: 74 IU/L (ref 49–135)
BUN/Creatinine Ratio: 20 (ref 9–23)
BUN: 28 mg/dL — ABNORMAL HIGH (ref 6–24)
Bilirubin Total: 0.3 mg/dL (ref 0.0–1.2)
CO2: 22 mmol/L (ref 20–29)
Calcium: 9.8 mg/dL (ref 8.7–10.2)
Chloride: 103 mmol/L (ref 96–106)
Creatinine, Ser: 1.43 mg/dL — ABNORMAL HIGH (ref 0.57–1.00)
Globulin, Total: 2.6 g/dL (ref 1.5–4.5)
Glucose: 88 mg/dL (ref 70–99)
Potassium: 4.2 mmol/L (ref 3.5–5.2)
Sodium: 141 mmol/L (ref 134–144)
Total Protein: 6.9 g/dL (ref 6.0–8.5)
eGFR: 43 mL/min/1.73 — ABNORMAL LOW

## 2024-11-27 LAB — TSH: TSH: 3.12 u[IU]/mL (ref 0.450–4.500)

## 2024-11-27 LAB — HEPATITIS B SURFACE ANTIBODY, QUANTITATIVE: Hepatitis B Surf Ab Quant: <3.5 m[IU]/mL — ABNORMAL LOW

## 2024-11-29 ENCOUNTER — Ambulatory Visit: Payer: Self-pay | Admitting: Family

## 2024-12-08 ENCOUNTER — Other Ambulatory Visit: Payer: Self-pay | Admitting: Family

## 2024-12-08 DIAGNOSIS — E1169 Type 2 diabetes mellitus with other specified complication: Secondary | ICD-10-CM

## 2024-12-08 DIAGNOSIS — E1165 Type 2 diabetes mellitus with hyperglycemia: Secondary | ICD-10-CM

## 2024-12-08 DIAGNOSIS — M1A9XX0 Chronic gout, unspecified, without tophus (tophi): Secondary | ICD-10-CM

## 2024-12-08 NOTE — Telephone Encounter (Signed)
 Copied from CRM #[id number]. Topic: Clinical - Medication Refill >> Dec 08, 2024 12:48 PM Geneva B wrote: Medication: allopurinol  (ZYLOPRIM ) 300 MG tablet diclofenac  (VOLTAREN ) 75 MG Empagliflozin -metFORMIN  HCl ER (SYNJARDY  XR) 25-1000 MG TB24 fenofibrate  160 MG tablet [levothyroxine  (SYNTHROID ) 75 MCG tabletlosartan (COZAAR ) 50 MG tablet tirzepatide  (MOUNJARO ) 12.5 MG/0.5ML Pen Has the patient contacted their pharmacy? Yes (Agent: If no, request that the patient contact the pharmacy for the refill. If patient does not wish to contact the pharmacy document the reason why and proceed with request.) (Agent: If yes, when and what did the pharmacy advise?)  This is the patient's preferred pharmacy:  [facility] - [place] , [place] - [address] [place]  [place] [address] Phone: [phone] Fax: [phone]  Is this the correct pharmacy for this prescription? Yes If no, delete pharmacy and type the correct one.   Has the prescription been filled recently? Yes  Is the patient out of the medication? Yes  Has the patient been seen for an appointment in the last year OR does the patient have an upcoming appointment? Yes  Can we respond through MyChart? No  Agent: Please be advised that Rx refills may take up to 3 business days. We ask that you follow-up with your pharmacy.

## 2024-12-14 ENCOUNTER — Telehealth: Payer: Self-pay | Admitting: Family Medicine

## 2024-12-14 DIAGNOSIS — E1169 Type 2 diabetes mellitus with other specified complication: Secondary | ICD-10-CM

## 2024-12-14 DIAGNOSIS — M1A9XX0 Chronic gout, unspecified, without tophus (tophi): Secondary | ICD-10-CM

## 2024-12-14 DIAGNOSIS — E039 Hypothyroidism, unspecified: Secondary | ICD-10-CM

## 2024-12-14 DIAGNOSIS — I152 Hypertension secondary to endocrine disorders: Secondary | ICD-10-CM

## 2024-12-14 MED ORDER — ALLOPURINOL 300 MG PO TABS: 300.0000 mg | ORAL_TABLET | Freq: Every day | ORAL | 1 refills | Status: AC

## 2024-12-14 MED ORDER — FENOFIBRATE 160 MG PO TABS: 160.0000 mg | ORAL_TABLET | Freq: Every day | ORAL | 2 refills | Status: AC

## 2024-12-14 MED ORDER — LEVOTHYROXINE SODIUM 75 MCG PO TABS: ORAL_TABLET | ORAL | 2 refills | Status: AC

## 2024-12-14 MED ORDER — LOSARTAN POTASSIUM 50 MG PO TABS: 50.0000 mg | ORAL_TABLET | Freq: Every day | ORAL | 2 refills | Status: AC

## 2024-12-14 NOTE — Telephone Encounter (Signed)
 Copied from CRM #[id number]. Topic: Clinical - Medication Question >> Dec 14, 2024 12:48 PM Harlene ORN wrote: Reason for CRM: [facility]  Calling to get the rest of the medications. losarten phenofibrate levothyroxine  allopurinol  Requesting a 90 day supply of these medications. Phone: [phone] Fax: [phone]

## 2024-12-14 NOTE — Addendum Note (Signed)
 Addended by: MICHELINE ROSINA FALCON on: 12/14/2024 02:13 PM   Modules accepted: Orders

## 2024-12-14 NOTE — Telephone Encounter (Signed)
 Refill request sent

## 2024-12-24 ENCOUNTER — Encounter: Payer: Self-pay | Admitting: *Deleted

## 2025-02-24 ENCOUNTER — Ambulatory Visit: Admitting: Family

## 2025-03-07 ENCOUNTER — Ambulatory Visit: Admitting: Family
